# Patient Record
Sex: Female | Born: 1945
Health system: Southern US, Community
[De-identification: ages and names within clinical notes are randomized; demographics above are authoritative.]

## PROBLEM LIST (undated history)

## (undated) DIAGNOSIS — H35039 Hypertensive retinopathy, unspecified eye: Secondary | ICD-10-CM

## (undated) DIAGNOSIS — E785 Hyperlipidemia, unspecified: Secondary | ICD-10-CM

## (undated) DIAGNOSIS — H3530 Unspecified macular degeneration: Secondary | ICD-10-CM

## (undated) DIAGNOSIS — I10 Essential (primary) hypertension: Secondary | ICD-10-CM

## (undated) DIAGNOSIS — E669 Obesity, unspecified: Secondary | ICD-10-CM

## (undated) DIAGNOSIS — E039 Hypothyroidism, unspecified: Secondary | ICD-10-CM

## (undated) DIAGNOSIS — I119 Hypertensive heart disease without heart failure: Secondary | ICD-10-CM

## (undated) DIAGNOSIS — I4891 Unspecified atrial fibrillation: Secondary | ICD-10-CM

## (undated) DIAGNOSIS — D6851 Activated protein C resistance: Secondary | ICD-10-CM

## (undated) DIAGNOSIS — E079 Disorder of thyroid, unspecified: Secondary | ICD-10-CM

## (undated) HISTORY — DX: Hyperlipidemia, unspecified: E78.5

## (undated) HISTORY — DX: Unspecified macular degeneration: H35.30

## (undated) HISTORY — PX: CATARACT EXTRACTION: SUR2

## (undated) HISTORY — PX: EYE SURGERY: SHX253

## (undated) HISTORY — DX: Unspecified atrial fibrillation: I48.91

## (undated) HISTORY — PX: COSMETIC SURGERY: SHX468

## (undated) HISTORY — DX: Essential (primary) hypertension: I10

## (undated) HISTORY — DX: Disorder of thyroid, unspecified: E07.9

## (undated) HISTORY — DX: Hypertensive retinopathy, unspecified eye: H35.039

## (undated) HISTORY — DX: Activated protein C resistance: D68.51

## (undated) HISTORY — PX: DENTAL SURGERY: SHX609

---

## 1999-10-01 ENCOUNTER — Other Ambulatory Visit: Admission: RE | Admit: 1999-10-01 | Discharge: 1999-10-01 | Payer: Self-pay | Admitting: *Deleted

## 2001-02-24 ENCOUNTER — Other Ambulatory Visit: Admission: RE | Admit: 2001-02-24 | Discharge: 2001-02-24 | Payer: Self-pay | Admitting: *Deleted

## 2004-12-10 ENCOUNTER — Encounter: Admission: RE | Admit: 2004-12-10 | Discharge: 2004-12-10 | Payer: Self-pay | Admitting: Gastroenterology

## 2005-11-12 ENCOUNTER — Other Ambulatory Visit: Admission: RE | Admit: 2005-11-12 | Discharge: 2005-11-12 | Payer: Self-pay | Admitting: Internal Medicine

## 2007-04-09 ENCOUNTER — Emergency Department (HOSPITAL_COMMUNITY): Admission: EM | Admit: 2007-04-09 | Discharge: 2007-04-09 | Payer: Self-pay | Admitting: Emergency Medicine

## 2007-04-21 ENCOUNTER — Other Ambulatory Visit: Admission: RE | Admit: 2007-04-21 | Discharge: 2007-04-21 | Payer: Self-pay | Admitting: *Deleted

## 2007-08-06 ENCOUNTER — Ambulatory Visit: Admission: RE | Admit: 2007-08-06 | Discharge: 2007-08-06 | Payer: Self-pay | Admitting: Cardiology

## 2007-08-06 ENCOUNTER — Encounter (INDEPENDENT_AMBULATORY_CARE_PROVIDER_SITE_OTHER): Payer: Self-pay | Admitting: Cardiology

## 2007-08-06 ENCOUNTER — Ambulatory Visit: Payer: Self-pay | Admitting: Surgery

## 2010-09-04 ENCOUNTER — Ambulatory Visit
Admission: RE | Admit: 2010-09-04 | Discharge: 2010-09-04 | Payer: Self-pay | Source: Home / Self Care | Attending: Family Medicine | Admitting: Family Medicine

## 2010-09-25 ENCOUNTER — Encounter: Payer: Self-pay | Admitting: Vascular Surgery

## 2010-10-02 ENCOUNTER — Encounter (INDEPENDENT_AMBULATORY_CARE_PROVIDER_SITE_OTHER): Payer: 59 | Admitting: Vascular Surgery

## 2010-10-02 DIAGNOSIS — I8010 Phlebitis and thrombophlebitis of unspecified femoral vein: Secondary | ICD-10-CM

## 2010-10-02 NOTE — Consult Note (Signed)
NEW PATIENT CONSULTATION  Carly Romero, Carly Romero DOB:  [birth year]                                       10/02/2010 [id number]#:[id number]  The patient presents today for evaluation of recent episode of superficial thrombophlebitis in her left great saphenous vein.  She is a very pleasant 65 year old white female with a long history dating back to her early 20s of clotting.  She reports that her first episode was of right leg DVT in senior year of college in 1970.  She apparently was hospitalized for 40 days on heparin.  She was begun on Coumadin in the 1980s.  She does recall one episode 15 years ago of probable DVT while on Coumadin.  She has had no history of DVT since that time.  Recently she did have an area where she struck her left calf with some trivial trauma and several days later noted thrombophlebitis.  She had an ultrasound which I reviewed from January 17 at [facility] confirming superficial thrombus in her great saphenous vein from the left calf to left thigh.  There was no evidence of DVT either acute or chronic at this ultrasound.  She does have a history of pulmonary embolus dating back into the 1970s as well.  She has had no further pulmonary embolus since being on Coumadin.  She has had no complications from Coumadin therapy.  She does report some chronic swelling but is very diligent in her elevation of her legs and of wearing compression garments when she is going to be on her feet a great deal of time.  She does have a history of factor V Leiden.  I do not have any documentation of her hypercoagulable workup.  PAST HISTORY:  Significant for elevated cholesterol, elevated blood pressure.  SOCIAL HISTORY:  She is single.  She is retired.  She does a lot of volunteer work.  She does smoke a pack of cigarettes per day and has social alcohol consumption.  FAMILY HISTORY:  Significant for father dying at age 77 of atherosclerosis.  Brother with an  aneurysm.  REVIEW OF SYSTEMS:  No weight loss or gain. VASCULAR:  Positive for history of prior thrombophlebitis and DVT. HEMATOLOGIC:  For factor V Leiden clotting disorder. URINARY:  For frequent urination. PSYCHIATRIC:  For depression. Otherwise her review of systems was negative.  PHYSICAL EXAMINATION:  General:  A well-developed, well-nourished white female appearing stated age, in no acute stress.  Vital signs:  Blood pressure is 140/81, pulse 62, respirations 18.  HEENT:  Normal.  Chest: Clear bilaterally without rales, rhonchi or wheezes.  Heart:  Regular rate and rhythm.  She has 2+ radial and 2+ dorsalis pedis pulses. Abdomen:  Soft, nontender.  No masses.  Moderate obesity. Musculoskeletal:  Shows no major deformities.  She does have bilateral cyanosis related to her venous hypertension.  Neurological:  No focal weakness or paresthesias.  Skin:  Without ulcers or rashes.  She does have easily palpable clot in her left great saphenous vein mid calf to thigh.  There is no erythema or tenderness currently.  I had a long discussion with the patient regarding the appropriateness of venacaval filter placement.  She clearly does not have any straightforward indications for this.  She does have a history of DVT in the past but none within the last, greater than 15 years while on adequate Coumadin  therapy.  I explained that the recent episode of superficial thrombophlebitis while on Coumadin therapy would not be an absolute indication for filter placement.  I explained the potential risk for caval occlusion and long-term swelling and would reserve caval filter placement for recurrent DVT or pulmonary embolus on appropriate anticoagulation therapy.  I explained that even if she had filter placement she would have to continue on lifelong anticoagulation due to her hypercoagulable state.  She understands and will notify us should she develop any new difficulty.    Larina Earthly,  M.D.  TFE/MEDQ  D:  10/02/2010  T:  10/02/2010  Job:  [id number]  cc:   Claude Manges, NP

## 2011-03-11 ENCOUNTER — Other Ambulatory Visit: Payer: Self-pay | Admitting: Physician Assistant

## 2011-03-11 ENCOUNTER — Other Ambulatory Visit (HOSPITAL_COMMUNITY)
Admission: RE | Admit: 2011-03-11 | Discharge: 2011-03-11 | Disposition: A | Discharge status: Home / Self Care | Payer: 59 | Source: Ambulatory Visit | Attending: Family Medicine | Admitting: Family Medicine

## 2011-03-11 DIAGNOSIS — Z01419 Encounter for gynecological examination (general) (routine) without abnormal findings: Secondary | ICD-10-CM | POA: Insufficient documentation

## 2011-04-17 ENCOUNTER — Other Ambulatory Visit: Payer: Self-pay | Admitting: Dermatology

## 2011-05-31 LAB — BASIC METABOLIC PANEL WITH GFR
BUN: 14
CO2: 29
Calcium: 9.1
Chloride: 99
Creatinine, Ser: 0.64
GFR calc non Af Amer: >60
Glucose, Bld: 111 — ABNORMAL HIGH
Potassium: 3.5
Sodium: 140

## 2011-05-31 LAB — PROTIME-INR
INR: 1.9 — ABNORMAL HIGH
Prothrombin Time: 22.3 — ABNORMAL HIGH

## 2011-05-31 LAB — CBC
MCV: 91.5
Platelets: 281
RDW: 13.4

## 2011-05-31 LAB — DIFFERENTIAL
Basophils Absolute: 0.1
Eosinophils Absolute: 0.3
Lymphocytes Relative: 36
Lymphs Abs: 4.4 — ABNORMAL HIGH
Neutro Abs: 6.8
Neutrophils Relative %: 56

## 2011-05-31 LAB — POCT CARDIAC MARKERS
CKMB, poc: <1 — ABNORMAL LOW
Myoglobin, poc: 62
Operator id: 282201
Troponin i, poc: <0.05

## 2011-05-31 LAB — D-DIMER, QUANTITATIVE

## 2013-07-28 ENCOUNTER — Other Ambulatory Visit: Payer: Self-pay | Admitting: Gastroenterology

## 2013-07-28 DIAGNOSIS — Z1211 Encounter for screening for malignant neoplasm of colon: Secondary | ICD-10-CM

## 2013-08-10 ENCOUNTER — Ambulatory Visit
Admission: RE | Admit: 2013-08-10 | Discharge: 2013-08-10 | Disposition: A | Discharge status: Home / Self Care | Payer: 59 | Source: Ambulatory Visit | Attending: Gastroenterology | Admitting: Gastroenterology

## 2013-08-10 DIAGNOSIS — Z1211 Encounter for screening for malignant neoplasm of colon: Secondary | ICD-10-CM

## 2014-04-18 ENCOUNTER — Other Ambulatory Visit: Payer: Self-pay | Admitting: Cardiology

## 2014-04-18 ENCOUNTER — Ambulatory Visit
Admission: RE | Admit: 2014-04-18 | Discharge: 2014-04-18 | Disposition: A | Discharge status: Home / Self Care | Payer: Medicare Other | Source: Ambulatory Visit | Attending: Cardiology | Admitting: Cardiology

## 2014-04-18 DIAGNOSIS — I4891 Unspecified atrial fibrillation: Secondary | ICD-10-CM

## 2014-04-26 ENCOUNTER — Ambulatory Visit (INDEPENDENT_AMBULATORY_CARE_PROVIDER_SITE_OTHER): Payer: Medicare Other

## 2014-04-26 VITALS — BP 140/66 | HR 86 | Resp 16 | Ht 66.0 in | Wt 220.0 lb

## 2014-04-26 DIAGNOSIS — M79609 Pain in unspecified limb: Secondary | ICD-10-CM

## 2014-04-26 DIAGNOSIS — M722 Plantar fascial fibromatosis: Secondary | ICD-10-CM

## 2014-04-26 DIAGNOSIS — M79673 Pain in unspecified foot: Secondary | ICD-10-CM

## 2014-04-26 NOTE — Progress Notes (Signed)
   Subjective:    Patient ID: Carly Romero, female    DOB: [birth year], 68 y.o.   MRN: [id number]  HPI Comments: N heel pain L right heel and inferior arch, left slight D 1 year O worsening C dull pain A barefooted, but constant pain T Birkenstock, OTC inserts     Review of Systems  Respiratory: Positive for shortness of breath.   All other systems reviewed and are negative.      Objective:   Physical Exam 68 year old white female well-developed well-nourished oriented x3 presents this time with a complaint of inferior heel pain right more so than left been going on for about a year aggravated with certain activities. Burning or she goes barefoot with her she wears Birkenstocks or good shoes however he does have some softer shoes which may have contributed to the symptomology. Lower extremity objective findings as follows vascular status is intact pedal pulses palpable DP +2/4 bilateral PT one over 4 bilateral capillary refill time 3 seconds. Epicritic and proprioceptive sensations intact and symmetric bilateral there is normal plantar response DTRs are listed dermatologic the skin color pigment normal hair growth absent nails unremarkable there is some thickening yellowing discoloration proptosis of nails bilateral knee dressings in the future they with debridement and palliative care if needed on orthopedic biomechanical exam pain on palpation of the medial calcaneal tubercle Magan the plantar fascia mid arch is most painful symptomatic right more so than left. X-rays reveal mild inferior calcaneal spur some mild retrocalcaneal spurring there on the right foot are some pretibial calcifications noted over no fractures no other osseous abnormality soft tissue edema noted mild fascial thickening noted bilateral.       Assessment & Plan:  Assessment this time is plantar fasciitis/heel spur syndrome fascial strapping applied to both feet maintained for 5 days also recommended Tylenol as  needed for pain also ice to the heel area maintain good stable shoe no barefoot or flimsy shoes or flip-flops use work boot for yard gardening work Writer or crocs around the house if no improvement reappointed in 2 weeks for further followup and reevaluation possible future nail care debridement as needed  Harriet Masson DPM

## 2014-04-26 NOTE — Patient Instructions (Signed)
Plantar Fasciitis  Plantar fasciitis is a common condition that causes foot pain. It is soreness (inflammation) of the band of tough fibrous tissue on the bottom of the foot that runs from the heel bone (calcaneus) to the ball of the foot. The cause of this soreness may be from excessive standing, poor fitting shoes, running on hard surfaces, being overweight, having an abnormal walk, or overuse (this is common in runners) of the painful foot or feet. It is also common in aerobic exercise dancers and ballet dancers.  SYMPTOMS   Most people with plantar fasciitis complain of:   Severe pain in the morning on the bottom of their foot especially when taking the first steps out of bed. This pain recedes after a few minutes of walking.   Severe pain is experienced also during walking following a long period of inactivity.   Pain is worse when walking barefoot or up stairs  DIAGNOSIS    Your caregiver will diagnose this condition by examining and feeling your foot.   Special tests such as X-rays of your foot, are usually not needed.  PREVENTION    Consult a sports medicine professional before beginning a new exercise program.   Walking programs offer a good workout. With walking there is a lower chance of overuse injuries common to runners. There is less impact and less jarring of the joints.   Begin all new exercise programs slowly. If problems or pain develop, decrease the amount of time or distance until you are at a comfortable level.   Wear good shoes and replace them regularly.   Stretch your foot and the heel cords at the back of the ankle (Achilles tendon) both before and after exercise.   Run or exercise on even surfaces that are not hard. For example, asphalt is better than pavement.   Do not run barefoot on hard surfaces.   If using a treadmill, vary the incline.   Do not continue to workout if you have foot or joint problems. Seek professional help if they do not improve.  HOME CARE INSTRUCTIONS     Avoid activities that cause you pain until you recover.   Use ice or cold packs on the problem or painful areas after working out.   Only take over-the-counter or prescription medicines for pain, discomfort, or fever as directed by your caregiver.   Soft shoe inserts or athletic shoes with air or gel sole cushions may be helpful.   If problems continue or become more severe, consult a sports medicine caregiver or your own health care provider. Cortisone is a potent anti-inflammatory medication that may be injected into the painful area. You can discuss this treatment with your caregiver.  MAKE SURE YOU:    Understand these instructions.   Will watch your condition.   Will get help right away if you are not doing well or get worse.  Document Released: 04/30/2001 Document Revised: 10/28/2011 Document Reviewed: 06/29/2008  ExitCare Patient Information 2015 [employer]. This information is not intended to replace advice given to you by your health care provider. Make sure you discuss any questions you have with your health care provider.

## 2014-05-13 ENCOUNTER — Encounter: Payer: Self-pay | Admitting: Cardiology

## 2014-05-13 DIAGNOSIS — E039 Hypothyroidism, unspecified: Secondary | ICD-10-CM | POA: Insufficient documentation

## 2014-05-13 DIAGNOSIS — E785 Hyperlipidemia, unspecified: Secondary | ICD-10-CM | POA: Insufficient documentation

## 2014-05-13 DIAGNOSIS — E669 Obesity, unspecified: Secondary | ICD-10-CM | POA: Insufficient documentation

## 2014-05-13 DIAGNOSIS — I119 Hypertensive heart disease without heart failure: Secondary | ICD-10-CM | POA: Insufficient documentation

## 2014-05-13 DIAGNOSIS — I4819 Other persistent atrial fibrillation: Secondary | ICD-10-CM | POA: Insufficient documentation

## 2014-05-13 DIAGNOSIS — Z7901 Long term (current) use of anticoagulants: Secondary | ICD-10-CM | POA: Insufficient documentation

## 2014-05-16 ENCOUNTER — Encounter (HOSPITAL_COMMUNITY): Payer: Self-pay | Admitting: Pharmacy Technician

## 2014-05-24 NOTE — H&P (Signed)
Carly Romero A  Date of visit:  05/24/2014 DOB:  [birth year]    Age:  68 yrs. Medical record number:  [id number]     Account number:  [id number] Primary Care Provider: Milagros Evener ____________________________ CURRENT DIAGNOSES  1. Persistent atrial fibrillation  2. Obesity  3. Essential (primary) hypertension  4. Long term (current) use of anticoagulants  5. Hypothyroidism, unspecified  6. Hyperlipidemia, unspecified ____________________________ ALLERGIES  Atorvastatin, Intolerance-diarrhea  Chantix, Suicidal thoughts  Codeine, Swelling (non-specific) ____________________________ MEDICATIONS  1. multivitamin tablet, 1 p.o. daily  2. Synthroid 112 mcg tablet, 1 p.o. daily  3. Crestor 20 mg tablet, 1 p.o. daily  4. amlodipine 2.5 mg tablet, 1 p.o. daily  5. atenolol 50 mg tablet, 1 p.o. daily  6. warfarin 5 mg tablet, Take as directed  7. Calcium 600 + D(3) 600 mg calcium-200 unit capsule, 1 p.o. daily  8. biotin 10 mg tablet, 1 p.o. daily  9. diltiazem ER 240 mg tablet,extended release 24 hr, 1 p.o. daily ____________________________ CHIEF COMPLAINTS Pre cardioversion visit ____________________________ HISTORY OF PRESENT ILLNESS  Patient seen for evaluation prior to cardioversion. She has a prior history of recurrent thromboembolic disease with factor V Leiden deficiency. She has then on long-term anticoagulants for years. She is obese and has hypertension and hyperlipidemia. I saw her at the end of August with a several month history of lower extremity edema, malaise, fatigue, and palpitations and she was found to be in atrial fibrillation. I started her on treatment with diltiazem and an echocardiogram showed LVH with preserved systolic function and mild left atrial enlargement. The duration of atrial fibrillation was unclear. We discussed options and talked about initiation of medicines versus going directly to cardioversion. After discussion of the options she felt that she  would rather go directly to cardioversion rather than start antiarrhythmic therapy although the chance of maintaining sinus rhythm may be less with that approach. Her anticoagulation has been therapeutic at her primary care doctor's office over the past 6 weeks. She is quite symptomatic with some episodic dizziness as well as shortness of breath and lower extremity edema. ____________________________ PAST HISTORY  Past Medical Illnesses:  hypertension, hyperlipidemia, hypothyroidism, obesity, thromboembolic disorder, DVT, Factor 5 Leiden, pulmonary embolus;  Cardiovascular Illnesses:  atrial fibrillation;  Surgical Procedures:  surg eyelids, wisdom teeth;  Cardiology Procedures-Invasive:  no history of prior cardiac procedures;  Cardiology Procedures-Noninvasive:  holter monitor, treadmill cardiolite, echocardiogram September 2015;  LVEF of 60% documented via echocardiogram on 05/09/2014,   ____________________________ CARDIO-PULMONARY TEST DATES EKG Date:  04/18/2014;  Echocardiography Date: 05/09/2014;  Chest Xray Date: 04/18/2014;   ____________________________ FAMILY HISTORY Brother -- Brother alive with problem, Hypertension Brother -- Brother dead, Alcoholism Brother -- Brother dead, Prostate cancer Brother -- Brother dead, Abdominal aortic aneurysm Father -- Father dead, Dementia/Alzheimers Mother -- Mother dead, Ulcerative colitis ____________________________ SOCIAL HISTORY Alcohol Use:  8 glasses wine q week;  Smoking:  used to smoke but quit, greater than 50 pack year history;  Diet:  regular diet;  Lifestyle:  single;  Exercise:  no regular exercise;  Occupation:  retired and Programmer, applications;  Residence:  lives alone;   ____________________________ REVIEW OF SYSTEMS General:  obesity, malaise and fatigue Eyes: wears eye glasses/contact lenses, denies diplopia, glaucoma or visual field defects. Respiratory: dyspnea with exertion Cardiovascular:  please review HPI Abdominal: denies  dyspepsia, GI bleeding, constipation, or diarrheaGenitourinary-Female: frequency, stress incontinence, urgency Neurological:  dizziness  ____________________________ PHYSICAL EXAMINATION VITAL SIGNS  Blood Pressure:  130/80 Sitting, Right  arm, regular cuff   Pulse:  86/min. Weight:  223.00 lbs. Height:  66"BMI: 36  Constitutional:  pleasant white female, in no acute distress, moderately obese Skin:  warm and dry to touch, no apparent skin lesions, or masses noted. Head:  normocephalic, normal hair pattern, no masses or tenderness Chest:  normal symmetry, clear to auscultation. Cardiac:  irregularly irregular rhythm, normal S1 and S2, no S3 or S4, no murmur Abdomen:  abdomen soft,non-tender, no masses, no hepatospenomegaly, or aneurysm noted Peripheral Pulses:  the femoral,dorsalis pedis, and posterior tibial pulses are full and equal bilaterally with no bruits auscultated. Extremities & Back:  1+ edema, bilateral venous insufficiency changes present Neurological:  no gross motor or sensory deficits noted, affect appropriate, oriented x3. ____________________________ MOST RECENT LIPID PANEL 04/06/14  CHOL TOTL 187 mg/dl, LDL 87 NM, HDL 64 mg/dl, TRIGLYCER 177 mg/dl, ALT 16 u/l, ALK PHOS 63 u/l, CHOL/HDL 2.9 (Calc) and AST 20 u/l ____________________________ IMPRESSIONS/PLAN  1. Persistent atrial fibrillation 2. Factor V Leiden with history of recurrent thrombosis 3. Hypertensive heart disease 4. Hyperlipidemia 5. Obesity  Recommendations:  Cardioversion discussed with the patient including risks of stroke, arrhythmia, death, or anesthesia risks. The patient understands and is willing to proceed. Also discussed possibility of reversion to atrial fibrillation and plans afterward. ____________________________ TODAYS ORDERS  1. Comprehensive Metabolic Panel: Today  2. CBC w/out Diff: Today  3. Draw PT/INR: Today  4. Cardioversion Thursday                        ____________________________ Cardiology Physician:  Kerry Hough MD [facility]

## 2014-05-26 ENCOUNTER — Encounter (HOSPITAL_COMMUNITY): Payer: Self-pay

## 2014-05-26 ENCOUNTER — Ambulatory Visit (HOSPITAL_COMMUNITY)
Admission: RE | Admit: 2014-05-26 | Discharge: 2014-05-26 | Disposition: A | Discharge status: Home / Self Care | Payer: Medicare Other | Source: Ambulatory Visit | Attending: Cardiology | Admitting: Cardiology

## 2014-05-26 ENCOUNTER — Encounter (HOSPITAL_COMMUNITY)
Admission: RE | Disposition: A | Discharge status: Home / Self Care | Payer: Self-pay | Source: Ambulatory Visit | Attending: Cardiology

## 2014-05-26 ENCOUNTER — Encounter (HOSPITAL_COMMUNITY): Payer: Medicare Other | Admitting: Certified Registered"

## 2014-05-26 ENCOUNTER — Ambulatory Visit (HOSPITAL_COMMUNITY): Payer: Medicare Other | Admitting: Certified Registered"

## 2014-05-26 DIAGNOSIS — E039 Hypothyroidism, unspecified: Secondary | ICD-10-CM | POA: Insufficient documentation

## 2014-05-26 DIAGNOSIS — I10 Essential (primary) hypertension: Secondary | ICD-10-CM | POA: Insufficient documentation

## 2014-05-26 DIAGNOSIS — I4891 Unspecified atrial fibrillation: Secondary | ICD-10-CM | POA: Diagnosis present

## 2014-05-26 DIAGNOSIS — D6851 Activated protein C resistance: Secondary | ICD-10-CM | POA: Insufficient documentation

## 2014-05-26 DIAGNOSIS — Z87891 Personal history of nicotine dependence: Secondary | ICD-10-CM | POA: Diagnosis not present

## 2014-05-26 HISTORY — PX: CARDIOVERSION: SHX1299

## 2014-05-26 SURGERY — CARDIOVERSION
Anesthesia: Monitor Anesthesia Care

## 2014-05-26 MED ORDER — SODIUM CHLORIDE 0.9 % IV SOLN
INTRAVENOUS | Status: DC
  Administered 2014-05-26: 500 mL via INTRAVENOUS

## 2014-05-26 MED ORDER — PROPOFOL 10 MG/ML IV BOLUS
INTRAVENOUS | Status: AC
  Filled 2014-05-26: qty 20

## 2014-05-26 MED ORDER — LIDOCAINE HCL (CARDIAC) 20 MG/ML IV SOLN
INTRAVENOUS | Status: DC | PRN
  Administered 2014-05-26: 20 mg via INTRAVENOUS

## 2014-05-26 MED ORDER — ATENOLOL 50 MG PO TABS: 25.0000 mg | ORAL_TABLET | Freq: Every day | ORAL | Status: DC

## 2014-05-26 MED ORDER — PROPOFOL 10 MG/ML IV BOLUS
INTRAVENOUS | Status: DC | PRN
Start: 2014-05-26 — End: 2014-05-26
  Administered 2014-05-26: 70 mg via INTRAVENOUS

## 2014-05-26 MED ORDER — HYDROCORTISONE 1 % EX CREA
1.0000 "application " | TOPICAL_CREAM | Freq: Three times a day (TID) | CUTANEOUS | Status: DC | PRN
  Filled 2014-05-26: qty 28

## 2014-05-26 MED ORDER — LIDOCAINE HCL (CARDIAC) 20 MG/ML IV SOLN
INTRAVENOUS | Status: AC
  Filled 2014-05-26: qty 5

## 2014-05-26 NOTE — Anesthesia Postprocedure Evaluation (Signed)
  Anesthesia Post-op Note  Patient: Carly Romero  Procedure(s) Performed: Procedure(s): CARDIOVERSION (N/A)  Patient Location: Endoscopy Unit  Anesthesia Type:General  Level of Consciousness: awake, alert , oriented and patient cooperative  Airway and Oxygen Therapy: Patient Spontanous Breathing  Post-op Pain: none  Post-op Assessment: Post-op Vital signs reviewed, Patient's Cardiovascular Status Stable, Respiratory Function Stable, Patent Airway, No signs of Nausea or vomiting and Pain level controlled  Post-op Vital Signs: Reviewed and stable  Last Vitals:  Filed Vitals:   05/26/14 1359  BP: 158/55  Pulse: 50  Resp: 19    Complications: No apparent anesthesia complications

## 2014-05-26 NOTE — Discharge Instructions (Signed)

## 2014-05-26 NOTE — Transfer of Care (Signed)
Immediate Anesthesia Transfer of Care Note  Patient: Carly Romero  Procedure(s) Performed: Procedure(s): CARDIOVERSION (N/A)  Patient Location: Endoscopy Unit  Anesthesia Type:MAC  Level of Consciousness: awake, alert , oriented and patient cooperative  Airway & Oxygen Therapy: Patient Spontanous Breathing and Patient connected to nasal cannula oxygen  Post-op Assessment: Report given to PACU RN, Post -op Vital signs reviewed and stable and Patient moving all extremities  Post vital signs: Reviewed and stable  Complications: No apparent anesthesia complications

## 2014-05-26 NOTE — CV Procedure (Signed)
Electrical Cardioversion Procedure Note  Carly Romero   68 y.o. female MRN: [id number] DOB: 09-27-1945  Today's date: 05/26/2014  Procedure: Electrical Cardioversion  Indications:  Atrial Fibrillation  Time Out: Verified patient identification, verified procedure,medications/allergies/relevent history reviewed, required imaging and test results available.  Performed  Procedure Details  The patient was NPO after midnight. Anesthesia was administered at the beside  by [facility] with 20 mg of lidocaine and 70 mg of propofol.  Cardioversion was done with synchronized biphasic defibrillation with AP pads with 120 watts.  The patient converted to normal sinus rhythm. The patient tolerated the procedure well   IMPRESSION:  Successful cardioversion of atrial fibrillation    W. Tollie Eth, Brooke Bonito. MD [facility]   05/26/2014, 1:19 PM

## 2014-05-26 NOTE — Anesthesia Preprocedure Evaluation (Addendum)
Anesthesia Evaluation  Patient identified by MRN, date of birth, ID band Patient awake    Reviewed: Allergy & Precautions, H&P , NPO status , Patient's Chart, lab work & pertinent test results  History of Anesthesia Complications (+) history of anesthetic complications  Airway Mallampati: I TM Distance: >3 FB Neck ROM: Full    Dental  (+) Dental Advisory Given   Pulmonary former smoker,  breath sounds clear to auscultation        Cardiovascular hypertension, Pt. on medications - angina+ dysrhythmias Atrial Fibrillation Rhythm:Irregular Rate:Normal  Normal LVF as per Dr. Wynonia Lawman   Neuro/Psych negative neurological ROS     GI/Hepatic negative GI ROS, Neg liver ROS,   Endo/Other  Hypothyroidism Morbid obesity  Renal/GU negative Renal ROS     Musculoskeletal   Abdominal (+) + obese,   Peds  Hematology  (+) Blood dyscrasia, , Factor V Leiden   Anesthesia Other Findings   Reproductive/Obstetrics                        Anesthesia Physical Anesthesia Plan  ASA: III  Anesthesia Plan: General   Post-op Pain Management:    Induction: Intravenous  Airway Management Planned: Mask  Additional Equipment:   Intra-op Plan:   Post-operative Plan:   Informed Consent: I have reviewed the patients History and Physical, chart, labs and discussed the procedure including the risks, benefits and alternatives for the proposed anesthesia with the patient or authorized representative who has indicated his/her understanding and acceptance.   Dental advisory given  Plan Discussed with: Anesthesiologist, Surgeon and CRNA  Anesthesia Plan Comments: (Plan routine monitors, GA for cardioversion)       Anesthesia Quick Evaluation

## 2014-05-27 ENCOUNTER — Encounter (HOSPITAL_COMMUNITY): Payer: Self-pay | Admitting: Cardiology

## 2014-08-31 DIAGNOSIS — I119 Hypertensive heart disease without heart failure: Secondary | ICD-10-CM | POA: Insufficient documentation

## 2014-08-31 DIAGNOSIS — E7849 Other hyperlipidemia: Secondary | ICD-10-CM | POA: Insufficient documentation

## 2014-09-01 DIAGNOSIS — D6851 Activated protein C resistance: Secondary | ICD-10-CM | POA: Insufficient documentation

## 2014-09-12 ENCOUNTER — Inpatient Hospital Stay (HOSPITAL_COMMUNITY)
Admission: RE | Admit: 2014-09-12 | Discharge: 2014-09-15 | DRG: 309 | Disposition: A | Discharge status: Home / Self Care | Payer: Medicare Other | Source: Ambulatory Visit | Attending: Cardiology | Admitting: Cardiology

## 2014-09-12 ENCOUNTER — Encounter (HOSPITAL_COMMUNITY): Payer: Self-pay

## 2014-09-12 DIAGNOSIS — Z87891 Personal history of nicotine dependence: Secondary | ICD-10-CM | POA: Diagnosis not present

## 2014-09-12 DIAGNOSIS — Z79899 Other long term (current) drug therapy: Secondary | ICD-10-CM

## 2014-09-12 DIAGNOSIS — Z888 Allergy status to other drugs, medicaments and biological substances status: Secondary | ICD-10-CM | POA: Diagnosis not present

## 2014-09-12 DIAGNOSIS — I481 Persistent atrial fibrillation: Principal | ICD-10-CM | POA: Diagnosis present

## 2014-09-12 DIAGNOSIS — E039 Hypothyroidism, unspecified: Secondary | ICD-10-CM | POA: Diagnosis present

## 2014-09-12 DIAGNOSIS — E785 Hyperlipidemia, unspecified: Secondary | ICD-10-CM | POA: Diagnosis present

## 2014-09-12 DIAGNOSIS — D6851 Activated protein C resistance: Secondary | ICD-10-CM | POA: Diagnosis present

## 2014-09-12 DIAGNOSIS — Z6836 Body mass index (BMI) 36.0-36.9, adult: Secondary | ICD-10-CM | POA: Diagnosis not present

## 2014-09-12 DIAGNOSIS — Z7901 Long term (current) use of anticoagulants: Secondary | ICD-10-CM | POA: Diagnosis not present

## 2014-09-12 DIAGNOSIS — Z885 Allergy status to narcotic agent status: Secondary | ICD-10-CM

## 2014-09-12 DIAGNOSIS — E669 Obesity, unspecified: Secondary | ICD-10-CM | POA: Diagnosis present

## 2014-09-12 DIAGNOSIS — I4819 Other persistent atrial fibrillation: Secondary | ICD-10-CM | POA: Diagnosis present

## 2014-09-12 DIAGNOSIS — Z86711 Personal history of pulmonary embolism: Secondary | ICD-10-CM | POA: Diagnosis not present

## 2014-09-12 DIAGNOSIS — I119 Hypertensive heart disease without heart failure: Secondary | ICD-10-CM | POA: Diagnosis present

## 2014-09-12 HISTORY — DX: Hypertensive heart disease without heart failure: I11.9

## 2014-09-12 HISTORY — DX: Hypothyroidism, unspecified: E03.9

## 2014-09-12 HISTORY — DX: Obesity, unspecified: E66.9

## 2014-09-12 LAB — BASIC METABOLIC PANEL
Anion gap: 3 — ABNORMAL LOW (ref 5–15)
BUN: 14 mg/dL (ref 6–23)
CHLORIDE: 106 mmol/L (ref 96–112)
CO2: 33 mmol/L — AB (ref 19–32)
Calcium: 9 mg/dL (ref 8.4–10.5)
Creatinine, Ser: 0.78 mg/dL (ref 0.50–1.10)
GFR calc Af Amer: >90 mL/min (ref >90)
GFR, EST NON AFRICAN AMERICAN: 85 mL/min — AB (ref >90)
GLUCOSE: 110 mg/dL — AB (ref 70–99)
POTASSIUM: 4.6 mmol/L (ref 3.5–5.1)
Sodium: 142 mmol/L (ref 135–145)

## 2014-09-12 LAB — PROTIME-INR
INR: 2.58 — ABNORMAL HIGH (ref 0.00–1.49)
Prothrombin Time: 27.9 seconds — ABNORMAL HIGH (ref 11.6–15.2)

## 2014-09-12 LAB — MAGNESIUM: MAGNESIUM: 2 mg/dL (ref 1.5–2.5)

## 2014-09-12 MED ORDER — ATENOLOL 25 MG PO TABS
50.0000 mg | ORAL_TABLET | Freq: Two times a day (BID) | ORAL | Status: DC
  Administered 2014-09-12 – 2014-09-14 (×4): 50 mg via ORAL
  Filled 2014-09-12 (×5): qty 2

## 2014-09-12 MED ORDER — LEVOTHYROXINE SODIUM 112 MCG PO TABS
112.0000 ug | ORAL_TABLET | Freq: Every day | ORAL | Status: DC
Start: 2014-09-13 — End: 2014-09-15
  Administered 2014-09-13 – 2014-09-15 (×3): 112 ug via ORAL
  Filled 2014-09-12 (×3): qty 1

## 2014-09-12 MED ORDER — ROSUVASTATIN CALCIUM 10 MG PO TABS
20.0000 mg | ORAL_TABLET | Freq: Every day | ORAL | Status: DC
  Administered 2014-09-13 – 2014-09-15 (×3): 20 mg via ORAL
  Filled 2014-09-12 (×3): qty 2

## 2014-09-12 MED ORDER — SODIUM CHLORIDE 0.9 % IJ SOLN
3.0000 mL | Freq: Two times a day (BID) | INTRAMUSCULAR | Status: DC
  Administered 2014-09-12 – 2014-09-13 (×4): 3 mL via INTRAVENOUS

## 2014-09-12 MED ORDER — SODIUM CHLORIDE 0.9 % IV SOLN
250.0000 mL | INTRAVENOUS | Status: DC | PRN
  Administered 2014-09-14: 14:00:00 via INTRAVENOUS

## 2014-09-12 MED ORDER — SODIUM CHLORIDE 0.9 % IJ SOLN: 3.0000 mL | INTRAMUSCULAR | Status: DC | PRN

## 2014-09-12 MED ORDER — WARFARIN SODIUM 5 MG PO TABS
5.0000 mg | ORAL_TABLET | Freq: Every day | ORAL | Status: DC
  Administered 2014-09-13 – 2014-09-14 (×2): 5 mg via ORAL
  Filled 2014-09-12 (×2): qty 1

## 2014-09-12 MED ORDER — WARFARIN - PHYSICIAN DOSING INPATIENT: Freq: Every day | Status: DC

## 2014-09-12 MED ORDER — DOFETILIDE 500 MCG PO CAPS
500.0000 ug | ORAL_CAPSULE | Freq: Two times a day (BID) | ORAL | Status: DC
  Administered 2014-09-12: 500 ug via ORAL
  Filled 2014-09-12: qty 1

## 2014-09-12 NOTE — Care Management Note (Signed)
    Page 1 of 1   09/15/2014     10:49:23 AM CARE MANAGEMENT NOTE 09/15/2014  Patient:  Carly Romero, Carly Romero   Account Number:  [id number]  Date Initiated:  09/12/2014  Documentation initiated by:  GRAVES-BIGELOW,Ranyah Groeneveld  Subjective/Objective Assessment:   Pt admitted for Tikosyn Initiation. Pt uses [facility]     Action/Plan:   Pt will need Rx for 7 day supply on day of d/c. CM will assist with 7 day supply to be filled at the [facility] on day of d/c. Benefits check in process for co pay cost and will make pt aware once completed.   Anticipated DC Date:  09/15/2014   Anticipated DC Plan:  [place]  CM consult      Choice offered to / List presented to:             Status of service:  Completed, signed off Medicare Important Message given?  YES (If response is "NO", the following Medicare IM given date fields will be blank) Date Medicare IM given:  09/15/2014 Medicare IM given by:  GRAVES-BIGELOW,Cillian Gwinner Date Additional Medicare IM given:   Additional Medicare IM given by:    Discharge Disposition:  HOME/SELF CARE  Per UR Regulation:  Reviewed for med. necessity/level of care/duration of stay  If discussed at Long Length of Stay Meetings, dates discussed:    Comments:  09-12-14 1448 Jacqlyn Krauss, RN,BSN  [phone] PER REP AT [place]: COVERED/ TIER 3  50% OF THE TOTAL COST OF MEDICATION ON ALL DOSES AT MAIL ORDER AND RETAIL NO AUTH REQUIRED PATIENT CAN USE: MOST MAJOR RETAIL PHARMACIES CM did call Kristopher Oppenheim at [facility] to check to see if on [employer] and they are.  Test claim resulted cost at 234.35. CM will make pt aware. Will continue to monitor.

## 2014-09-12 NOTE — H&P (Signed)
Carly Romero A  Date of visit:  09/12/2014 DOB:  06-20-1946    Age:  69 yrs. Medical record number:  [id number]     Account number:  [id number] Primary Care Provider: Milagros Evener ____________________________ CURRENT DIAGNOSES  1. Persistent Atrial Fibrillation  2. Obesity  3. Essential (primary) hypertension  4. Hypothyroidism, unspecified  5. Hyperlipidemia, unspecified  6. Long term (current) use of anticoagulants ____________________________ ALLERGIES  Atorvastatin, Intolerance-diarrhea  Chantix, Suicidal thoughts  Codeine, Swelling (non-specific) ____________________________ MEDICATIONS  1. multivitamin tablet, 1 p.o. daily  2. Synthroid 112 mcg tablet, 1 p.o. daily  3. Crestor 20 mg tablet, 1 p.o. daily  4. warfarin 5 mg tablet, Take as directed  5. Calcium 600 + D(3) 600 mg calcium-200 unit capsule, 1 p.o. daily  6. biotin 10 mg tablet, 1 p.o. daily  7. diltiazem ER 240 mg tablet,extended release 24 hr, 1 p.o. daily  8. atenolol 50 mg tablet, one in the am and 1/2 tablet in the pm ____________________________ CHIEF COMPLAINTS  Followup of Persistent Atrial Fibrillation  Hair loss since on diltiazem ____________________________ HISTORY OF PRESENT ILLNESS Patient seen for followup of persistent atrial fibrillation. Since she was previously here she went to see an electrophysiologist at [facility] in atrial fibrillation clinic there. He basically agreed with our recommendations and it recommended that she try medicines initially prior to going to play should because he was concerned about the thromboembolic risk associated with factor V Leiden. She continues to complain of fatigue. She also needs to have a sleep apnea study and we again talked about the relationship of weight and weight reduction to atrial fibrillation. She has no angina and denies PND, orthopnea or claudication. Her major complaint is hair loss on the diltiazem. ____________________________ PAST  HISTORY  Past Medical Illnesses:  hypertension, hyperlipidemia, hypothyroidism, obesity, thromboembolic disorder, DVT, Factor 5 Leiden, pulmonary embolus;  Cardiovascular Illnesses:  atrial fibrillation;  Surgical Procedures:  surg eyelids, wisdom teeth;  NYHA Classification:  I;  Canadian Angina Classification:  Class 0: Asymptomatic;  Cardiology Procedures-Invasive:  cardioversion October 2015;  Cardiology Procedures-Noninvasive:  holter monitor, treadmill cardiolite, echocardiogram September 2015;  LVEF of 60% documented via echocardiogram on 05/09/2014,   ____________________________ CARDIO-PULMONARY TEST DATES EKG Date:  06/02/2014;  Echocardiography Date: 05/09/2014;  Chest Xray Date: 04/18/2014;   ____________________________ SOCIAL HISTORY Alcohol Use:  8 glasses wine q week;  Smoking:  used to smoke but quit, greater than 50 pack year history;  Diet:  regular diet;  Lifestyle:  single;  Exercise:  no regular exercise;  Occupation:  retired and Programmer, [employer];  Residence:  lives alone;   ____________________________ REVIEW OF SYSTEMS General:  obesity, malaise and fatigue Eyes: wears eye glasses/contact lenses, denies diplopia, glaucoma or visual field defects. Respiratory: dyspnea with exertion Cardiovascular:  please review HPI Abdominal: denies dyspepsia, GI bleeding, constipation, or diarrheaGenitourinary-Female: frequency, stress incontinence, urgencyNeurological:  dizziness  ____________________________ PHYSICAL EXAMINATION VITAL SIGNS  Blood Pressure:  142/80 Sitting, Left arm, large cuff  , 146/86 Standing, Left arm and large cuff   Pulse:  80/min. Weight:  227.00 lbs. Height:  66"BMI: 36  Constitutional:  pleasant white female, in no acute distress, moderately obese Skin:  warm and dry to touch, no apparent skin lesions, or masses noted. Head:  normocephalic, normal hair pattern, no masses or tenderness Chest:  normal symmetry, clear to auscultation. Cardiac:  irregularly  irregular rhythm, normal S1 and S2, no S3 or S4, no murmur Abdomen:  abdomen soft,non-tender, no masses, no hepatospenomegaly,  or aneurysm noted Peripheral Pulses:  the femoral,dorsalis pedis, and posterior tibial pulses are full and equal bilaterally with no bruits auscultated. Extremities & Back:  1+ edema, bilateral venous insufficiency changes present Neurological:  no gross motor or sensory deficits noted, affect appropriate, oriented x3. ____________________________ MOST RECENT LIPID PANEL 04/06/14  CHOL TOTL 187 mg/dl, LDL 87 NM, HDL 64 mg/dl, TRIGLYCER 177 mg/dl, CHOL/HDL 2.9 (Calc)  ____________________________ IMPRESSIONS/PLAN  1. Persistent atrial fibrillation 2. Obesity with need to lose weight 3. Hypertension 4. Hair loss possibly due to diltiazem  Recommendations:  Arrange admission to the hospital to initiate Tikosyn. Stop diltiazem and increase atenolol to 50 mg in the morning and 25 in the evening. Further discussion about atrial fibrillation. ____________________________ TODAYS ORDERS  1. Sleep Study with c-pap: First Available  2. Admit to Hospital: January 25                       ____________________________ Cardiology Physician:  Kerry Hough MD [facility]

## 2014-09-13 LAB — BASIC METABOLIC PANEL
Anion gap: 8 (ref 5–15)
BUN: 14 mg/dL (ref 6–23)
CHLORIDE: 106 mmol/L (ref 96–112)
CO2: 30 mmol/L (ref 19–32)
Calcium: 8.8 mg/dL (ref 8.4–10.5)
Creatinine, Ser: 0.85 mg/dL (ref 0.50–1.10)
GFR calc non Af Amer: 69 mL/min — ABNORMAL LOW (ref >90)
GFR, EST AFRICAN AMERICAN: 80 mL/min — AB (ref >90)
GLUCOSE: 98 mg/dL (ref 70–99)
POTASSIUM: 4.1 mmol/L (ref 3.5–5.1)
SODIUM: 144 mmol/L (ref 135–145)

## 2014-09-13 LAB — MAGNESIUM: MAGNESIUM: 2 mg/dL (ref 1.5–2.5)

## 2014-09-13 LAB — PROTIME-INR
INR: 2.58 — ABNORMAL HIGH (ref 0.00–1.49)
PROTHROMBIN TIME: 27.9 s — AB (ref 11.6–15.2)

## 2014-09-13 MED ORDER — SODIUM CHLORIDE 0.9 % IV SOLN: INTRAVENOUS | Status: DC

## 2014-09-13 MED ORDER — DOFETILIDE 250 MCG PO CAPS
250.0000 ug | ORAL_CAPSULE | Freq: Two times a day (BID) | ORAL | Status: DC
  Administered 2014-09-13 – 2014-09-15 (×5): 250 ug via ORAL
  Filled 2014-09-13 (×5): qty 1

## 2014-09-13 NOTE — Progress Notes (Signed)
Subjective:  No complaints  Objective:  Vital Signs in the last 24 hours: BP 147/100 mmHg  Pulse 113  Temp(Src) 97.4 F (36.3 C) (Oral)  Resp 18  Ht 5\' 6"  (1.676 m)  Wt 104.3 kg (229 lb 15 oz)  BMI 37.13 kg/m2  SpO2 98%  Physical Exam: Pleasant obese WF in NAD in bed Lungs:  Clear  Cardiac: irregular rhythm, normal S1 and S2, no S3  Intake/Output from previous day: 01/25 0701 - 01/26 0700 In: -  Out: 600 [Urine:600] Weight Filed Weights   09/12/14 0930 09/13/14 0600  Weight: 104.101 kg (229 lb 8 oz) 104.3 kg (229 lb 15 oz)    Lab Results: Basic Metabolic Panel:  Recent Labs  09/12/14 1124 09/13/14 0410  NA 142 144  K 4.6 4.1  CL 106 106  CO2 33* 30  GLUCOSE 110* 98  BUN 14 14  CREATININE 0.78 0.85    PROTIME: Lab Results  Component Value Date   INR 2.58* 09/13/2014   INR 2.58* 09/12/2014   INR 1.9* 04/09/2007    Telemetry:  Atrial fibrillation with somewhat rapid ventricular response  QTC around .455   Assessment/Plan:  1. Persistent atrial fibrillation on Tikosyn load Adjusted dose for slight prolonged QTC last night.  Rec:  Continue Tikosyn.  Cardioversion tomorrow if not converted.       Kerry Hough  MD [facility]  09/13/2014, 8:38 AM

## 2014-09-13 NOTE — Significant Event (Signed)
Asked to review ECG for patient with Tikosyn load.   Computer is reading QT at 398 with QTc of 528. On manual review, QT appears more like 370 which results in a QTc of 492.  QTc is borderline and needs to be watched closely, but it is OK for her to receive AM dose.

## 2014-09-14 ENCOUNTER — Inpatient Hospital Stay (HOSPITAL_COMMUNITY): Payer: Medicare Other | Admitting: Certified Registered Nurse Anesthetist

## 2014-09-14 ENCOUNTER — Encounter (HOSPITAL_COMMUNITY): Payer: Self-pay | Admitting: *Deleted

## 2014-09-14 ENCOUNTER — Encounter (HOSPITAL_COMMUNITY)
Admission: RE | Disposition: A | Discharge status: Home / Self Care | Payer: Self-pay | Source: Ambulatory Visit | Attending: Cardiology

## 2014-09-14 DIAGNOSIS — D6851 Activated protein C resistance: Secondary | ICD-10-CM | POA: Insufficient documentation

## 2014-09-14 HISTORY — PX: CARDIOVERSION: SHX1299

## 2014-09-14 LAB — MAGNESIUM: MAGNESIUM: 2 mg/dL (ref 1.5–2.5)

## 2014-09-14 LAB — BASIC METABOLIC PANEL
Anion gap: 7 (ref 5–15)
BUN: 13 mg/dL (ref 6–23)
CALCIUM: 8.7 mg/dL (ref 8.4–10.5)
CHLORIDE: 106 mmol/L (ref 96–112)
CO2: 30 mmol/L (ref 19–32)
CREATININE: 0.76 mg/dL (ref 0.50–1.10)
GFR calc Af Amer: >90 mL/min (ref >90)
GFR calc non Af Amer: 85 mL/min — ABNORMAL LOW (ref >90)
Glucose, Bld: 101 mg/dL — ABNORMAL HIGH (ref 70–99)
Potassium: 4.1 mmol/L (ref 3.5–5.1)
Sodium: 143 mmol/L (ref 135–145)

## 2014-09-14 LAB — PROTIME-INR
INR: 2.11 — ABNORMAL HIGH (ref 0.00–1.49)
Prothrombin Time: 23.9 seconds — ABNORMAL HIGH (ref 11.6–15.2)

## 2014-09-14 SURGERY — CARDIOVERSION
Anesthesia: General

## 2014-09-14 MED ORDER — WARFARIN - PHARMACIST DOSING INPATIENT: Freq: Every day | Status: DC

## 2014-09-14 MED ORDER — HYDROCORTISONE 1 % EX CREA
1.0000 "application " | TOPICAL_CREAM | Freq: Three times a day (TID) | CUTANEOUS | Status: DC | PRN
  Filled 2014-09-14: qty 28

## 2014-09-14 MED ORDER — HYDROCORTISONE 1 % EX CREA: 1.0000 "application " | TOPICAL_CREAM | Freq: Three times a day (TID) | CUTANEOUS | Status: DC | PRN

## 2014-09-14 MED ORDER — PROPOFOL 10 MG/ML IV BOLUS
INTRAVENOUS | Status: DC | PRN
  Administered 2014-09-14: 70 mg via INTRAVENOUS

## 2014-09-14 MED ORDER — LIDOCAINE HCL (CARDIAC) 20 MG/ML IV SOLN
INTRAVENOUS | Status: DC | PRN
  Administered 2014-09-14: 40 mg via INTRAVENOUS

## 2014-09-14 MED ORDER — SODIUM CHLORIDE 0.9 % IJ SOLN
3.0000 mL | Freq: Two times a day (BID) | INTRAMUSCULAR | Status: DC
  Administered 2014-09-14: 3 mL via INTRAVENOUS

## 2014-09-14 MED ORDER — WARFARIN SODIUM 7.5 MG PO TABS: 7.5000 mg | ORAL_TABLET | ORAL | Status: DC

## 2014-09-14 MED ORDER — SODIUM CHLORIDE 0.9 % IJ SOLN: 3.0000 mL | INTRAMUSCULAR | Status: DC | PRN

## 2014-09-14 MED ORDER — WARFARIN SODIUM 5 MG PO TABS
5.0000 mg | ORAL_TABLET | ORAL | Status: DC
  Administered 2014-09-15: 5 mg via ORAL
  Filled 2014-09-14: qty 1

## 2014-09-14 MED ORDER — SODIUM CHLORIDE 0.9 % IV SOLN: 250.0000 mL | INTRAVENOUS | Status: DC

## 2014-09-14 MED ORDER — WARFARIN SODIUM 2.5 MG PO TABS
2.5000 mg | ORAL_TABLET | Freq: Once | ORAL | Status: AC
  Administered 2014-09-14: 2.5 mg via ORAL
  Filled 2014-09-14: qty 1

## 2014-09-14 NOTE — Progress Notes (Signed)
ANTICOAGULATION CONSULT NOTE - Initial Consult  Pharmacy Consult for coumadin Indication: atrial fibrillation  Allergies  Allergen Reactions  . Chantix [Varenicline]     Suicidal thoughts  . Codeine Swelling    Swelling as a young adult  . Lipitor [Atorvastatin] Diarrhea    And cramping.    Patient Measurements: Height: 5\' 6"  (167.6 cm) Weight: 229 lb 11.5 oz (104.2 kg) IBW/kg (Calculated) : 59.3   Vital Signs: Temp: 97.6 F (36.4 C) (01/27 0559) Temp Source: Oral (01/27 0559) BP: 131/107 mmHg (01/27 0559) Pulse Rate: 88 (01/27 0559)  Labs:  Recent Labs  09/12/14 1124 09/13/14 0410 09/14/14 0431  LABPROT 27.9* 27.9* 23.9*  INR 2.58* 2.58* 2.11*  CREATININE 0.78 0.85 0.76    Estimated Creatinine Clearance: 83.3 mL/min (by C-G formula based on Cr of 0.76).   Medical History: Past Medical History  Diagnosis Date  . Hypertension   . Hyperlipidemia   . Factor V Leiden   . Thyroid disease   . Atrial fibrillation     Medications:  Prescriptions prior to admission  Medication Sig Dispense Refill Last Dose  . atenolol (TENORMIN) 50 MG tablet Take 0.5 tablets (25 mg total) by mouth daily. (Patient taking differently: Take 25 mg by mouth daily. Take 50mg  in the morning and half a tablet at night)   09/12/2014 at 0700  . BIOTIN PO Take 1 tablet by mouth daily.   09/11/2014 at Unknown time  . Calcium Carbonate-Vitamin D (CALCIUM + D PO) Take 1 tablet by mouth daily.   09/11/2014 at Unknown time  . levothyroxine (SYNTHROID, LEVOTHROID) 112 MCG tablet Take 112 mcg by mouth daily before breakfast.   09/12/2014 at Unknown time  . Multiple Vitamin (MULTIVITAMIN WITH MINERALS) TABS tablet Take 1 tablet by mouth daily.   09/11/2014 at Unknown time  . rosuvastatin (CRESTOR) 20 MG tablet Take 20 mg by mouth daily.   09/12/2014 at Unknown time  . warfarin (COUMADIN) 5 MG tablet Take 5 mg by mouth daily. Take 5 mg daily except take 7.5 MG on wednesdays   09/12/2014 at Unknown time     Assessment: 69 yo lady to continue coumadin for afib.  Her admit INR is therapeutic on home regimen.   Goal of Therapy:  INR 2-3 Monitor platelets by anticoagulation protocol: Yes   Plan:  Cont home coumadin dose Daily PT/INR  Thanks for allowing pharmacy to be a part of this patient's care.  Excell Seltzer, PharmD Clinical Pharmacist, [phone] 09/14/2014,9:43 AM

## 2014-09-14 NOTE — Anesthesia Postprocedure Evaluation (Signed)
  Anesthesia Post-op Note  Patient: Carly Romero  Procedure(s) Performed: Procedure(s): CARDIOVERSION (N/A)  Patient Location: PACU  Anesthesia Type:General  Level of Consciousness: awake and alert   Airway and Oxygen Therapy: Patient Spontanous Breathing  Post-op Pain: none  Post-op Assessment: Post-op Vital signs reviewed, Patient's Cardiovascular Status Stable and Respiratory Function Stable  Post-op Vital Signs: Reviewed  Filed Vitals:   09/14/14 1424  BP: 158/75  Pulse: 55  Temp:   Resp: 19    Complications: No apparent anesthesia complications

## 2014-09-14 NOTE — Anesthesia Preprocedure Evaluation (Addendum)
Anesthesia Evaluation  Patient identified by MRN, date of birth, ID band Patient awake    Reviewed: Allergy & Precautions, H&P , NPO status , Patient's Chart, lab work & pertinent test results, reviewed documented beta blocker date and time   Airway Mallampati: II  TM Distance: >3 FB Neck ROM: Full    Dental no notable dental hx. (+) Teeth Intact, Dental Advisory Given   Pulmonary neg pulmonary ROS, former smoker,  breath sounds clear to auscultation  Pulmonary exam normal       Cardiovascular hypertension, Pt. on medications and Pt. on home beta blockers + dysrhythmias Atrial Fibrillation Rhythm:Irregular Rate:Normal     Neuro/Psych negative neurological ROS  negative psych ROS   GI/Hepatic negative GI ROS, Neg liver ROS,   Endo/Other  Hypothyroidism Morbid obesity  Renal/GU negative Renal ROS  negative genitourinary   Musculoskeletal   Abdominal   Peds  Hematology negative hematology ROS (+)   Anesthesia Other Findings   Reproductive/Obstetrics negative OB ROS                           Anesthesia Physical Anesthesia Plan  ASA: III  Anesthesia Plan: General   Post-op Pain Management:    Induction: Intravenous  Airway Management Planned: Mask  Additional Equipment:   Intra-op Plan:   Post-operative Plan:   Informed Consent: I have reviewed the patients History and Physical, chart, labs and discussed the procedure including the risks, benefits and alternatives for the proposed anesthesia with the patient or authorized representative who has indicated his/her understanding and acceptance.   Dental advisory given  Plan Discussed with: CRNA  Anesthesia Plan Comments:         Anesthesia Quick Evaluation

## 2014-09-14 NOTE — CV Procedure (Signed)
Electrical Cardioversion Procedure Note  Carly Romero   69 y.o. female MRN: [id number] DOB: 10-21-1945  Today's date: 09/14/2014  Procedure: Electrical Cardioversion  Indications:  Atrial Fibrillation  Time Out: Verified patient identification, verified procedure,medications/allergies/relevent history reviewed, required imaging and test results available.  Performed  Procedure Details  The patient was NPO after midnight. Anesthesia was administered at the beside  by Dr.Fitzgerald with 40 mg of lidocaine and 70 mg of propofol.  Cardioversion was done with synchronized biphasic defibrillation with AP pads with 120 watts.  The patient converted to normal sinus rhythm. The patient tolerated the procedure well   IMPRESSION:  Successful cardioversion of atrial fibrillation    W. Tollie Eth, Brooke Bonito. MD [facility]   09/14/2014, 2:10 PM

## 2014-09-14 NOTE — Interval H&P Note (Signed)
History and Physical Interval Note:  09/14/2014 2:02 PM  Carly Romero  has presented today for surgery, with the diagnosis of afib  The various methods of treatment have been discussed with the patient and family. After consideration of risks, benefits and other options for treatment, the patient has consented to  Procedure(s): CARDIOVERSION (N/A) as a surgical intervention .  The patient's history has been reviewed, patient examined, no change in status, stable for surgery.  I have reviewed the patient's chart and labs.  Questions were answered to the patient's satisfaction.     Aras Albarran JR,W [facility]

## 2014-09-14 NOTE — Progress Notes (Signed)
Pt's post Tikosyn QTC per 12 lead EKG is 0.528. Pt asymptomatic. Dr. Tommi Rumps made aware. No new orders. Cont to monitor.

## 2014-09-14 NOTE — Anesthesia Procedure Notes (Signed)
Procedure Name: MAC Date/Time: 09/14/2014 2:00 PM Performed by: Maeola Harman Pre-anesthesia Checklist: Patient identified, Emergency Drugs available, Suction available, Patient being monitored and Timeout performed Patient Re-evaluated:Patient Re-evaluated prior to inductionOxygen Delivery Method: Ambu bag Intubation Type: IV induction Ventilation: Mask ventilation without difficulty Placement Confirmation: breath sounds checked- equal and bilateral Dental Injury: Teeth and Oropharynx as per pre-operative assessment

## 2014-09-14 NOTE — Progress Notes (Signed)
cardiologist on call notified of pt being asymptomatic but bradycardic. New orders to hold night time dose of atenolol d/t pt's hr being in the high 30s - 50s per MD Turner. Will continue to monitor the pt. Hoover Brunette, RN

## 2014-09-14 NOTE — Transfer of Care (Signed)
Immediate Anesthesia Transfer of Care Note  Patient: Carly Romero  Procedure(s) Performed: Procedure(s): CARDIOVERSION (N/A)  Patient Location: Endoscopy Unit  Anesthesia Type:General  Level of Consciousness: awake, alert  and oriented  Airway & Oxygen Therapy: Patient connected to nasal cannula oxygen  Post-op Assessment: Report given to PACU RN  Post vital signs: stable  Complications: No apparent anesthesia complications

## 2014-09-15 ENCOUNTER — Encounter (HOSPITAL_COMMUNITY): Payer: Self-pay | Admitting: Cardiology

## 2014-09-15 LAB — BASIC METABOLIC PANEL
ANION GAP: 6 (ref 5–15)
BUN: 21 mg/dL (ref 6–23)
CO2: 29 mmol/L (ref 19–32)
CREATININE: 0.8 mg/dL (ref 0.50–1.10)
Calcium: 8.6 mg/dL (ref 8.4–10.5)
Chloride: 107 mmol/L (ref 96–112)
GFR calc non Af Amer: 75 mL/min — ABNORMAL LOW (ref >90)
GFR, EST AFRICAN AMERICAN: 86 mL/min — AB (ref >90)
Glucose, Bld: 95 mg/dL (ref 70–99)
POTASSIUM: 4 mmol/L (ref 3.5–5.1)
Sodium: 142 mmol/L (ref 135–145)

## 2014-09-15 LAB — MAGNESIUM: Magnesium: 2.1 mg/dL (ref 1.5–2.5)

## 2014-09-15 LAB — PROTIME-INR
INR: 2.41 — AB (ref 0.00–1.49)
PROTHROMBIN TIME: 26.5 s — AB (ref 11.6–15.2)

## 2014-09-15 MED ORDER — ATENOLOL 25 MG PO TABS
25.0000 mg | ORAL_TABLET | Freq: Two times a day (BID) | ORAL | Status: DC
  Administered 2014-09-15: 25 mg via ORAL
  Filled 2014-09-15: qty 1

## 2014-09-15 MED ORDER — DOFETILIDE 250 MCG PO CAPS: 250.0000 ug | ORAL_CAPSULE | Freq: Two times a day (BID) | ORAL | Status: DC

## 2014-09-15 MED ORDER — ATENOLOL 50 MG PO TABS: 25.0000 mg | ORAL_TABLET | Freq: Two times a day (BID) | ORAL | Status: DC

## 2014-09-15 NOTE — Discharge Summary (Signed)
Physician Discharge Summary  Patient ID: Carly Romero MRN: [id number] DOB/AGE: [birth year] 69 y.o.  Admit date: 09/12/2014 Discharge date: 09/15/2014  Primary Physician: Dr. Milagros Evener  Primary Discharge Diagnosis:  1.  Persistent atrial fibrillation converted to sinus rhythm this admission  Secondary Discharge Diagnosis: 2.  Long-term anticoagulation with warfarin 3.  Hypertensive heart disease. 4.  Obesity. 5.  Hypothyroidism. 6.  Factor V Leiden  Procedures:  Cardioversion  Hospital Course: This 69 year old female had developed persistent atrial fibrillation.  She was cardioverted in October, but reverted back to atrial fibrillation and was significantly symptomatic with this.  She had an opinion at [facility] for ablation consideration, but they recommended she have a trial of medicine to convert her back to rhythm, because of her risk factor of factor V Leiden and not knowing how this would interact with ablation.  She thus was admitted to the hospital to initiate Tikosyn under monitoring.  After appropriate labs were drawn Tikosyn was initiated at 500 g.  She had some QT prolongation with this and the dose was reduced to 250 g that she tolerated well.  She failed to convert to sinus rhythm and on 1/27 underwent cardioversion with reversion to sinus rhythm.  She had some bradycardia the evening afterwards and her atenolol dose was reduced.  She did maintain sinus rhythm but was noted to have some PACs and this will need to be watched closely.  She was discharged home in improved condition this morning.    Discharge Exam: Blood pressure 115/73, pulse 97, temperature 97.6 F (36.4 C), temperature source Oral, resp. rate 18, height 5\' 6"  (1.676 m), weight 103.602 kg (228 lb 6.4 oz), SpO2 100 %. Weight: 103.602 kg (228 lb 6.4 oz) Lungs clear, no S3  Labs: CBC:   Lab Results  Component Value Date   WBC 12.3* 04/09/2007   HGB 15.0 04/09/2007   HCT 43.5 04/09/2007   MCV 91.5 04/09/2007   PLT 281 04/09/2007    CMP:  Recent Labs Lab 09/15/14 0535  NA 142  K 4.0  CL 107  CO2 29  BUN 21  CREATININE 0.80  CALCIUM 8.6  GLUCOSE 95   EKG: Normal sinus rhythm, QTC 0.72  Discharge Medications:   Medication List    TAKE these medications        atenolol 50 MG tablet  Commonly known as:  TENORMIN  Take 0.5 tablets (25 mg total) by mouth 2 (two) times daily.     BIOTIN PO  Take 1 tablet by mouth daily.     CALCIUM + D PO  Take 1 tablet by mouth daily.     dofetilide 250 MCG capsule  Commonly known as:  TIKOSYN  Take 1 capsule (250 mcg total) by mouth 2 (two) times daily.     levothyroxine 112 MCG tablet  Commonly known as:  SYNTHROID, LEVOTHROID  Take 112 mcg by mouth daily before breakfast.     multivitamin with minerals Tabs tablet  Take 1 tablet by mouth daily.     rosuvastatin 20 MG tablet  Commonly known as:  CRESTOR  Take 20 mg by mouth daily.     warfarin 5 MG tablet  Commonly known as:  COUMADIN  Take 5 mg by mouth daily. Take 5 mg daily except take 7.5 MG on wednesdays       Followup plans and appointments: Follow-up Dr. Wynonia Lawman in one week  Time spent with patient to include physician time:  30 minutes   Signed:  Kerry Hough. MD [facility] 09/15/2014, 9:24 AM

## 2014-10-06 ENCOUNTER — Ambulatory Visit (HOSPITAL_BASED_OUTPATIENT_CLINIC_OR_DEPARTMENT_OTHER): Payer: Medicare Other | Attending: Cardiology | Admitting: *Deleted

## 2014-10-06 VITALS — Ht 66.0 in | Wt 229.0 lb

## 2014-10-06 DIAGNOSIS — G4730 Sleep apnea, unspecified: Secondary | ICD-10-CM | POA: Diagnosis present

## 2014-10-22 DIAGNOSIS — G4730 Sleep apnea, unspecified: Secondary | ICD-10-CM

## 2014-10-22 NOTE — Sleep Study (Signed)
   NAME: Carly Romero DATE OF BIRTH:  Jan 30, 1946 MEDICAL RECORD NUMBER [id number]  LOCATION: [facility]  PHYSICIAN: Baird Lyons D  DATE OF STUDY: 10/06/2014  SLEEP STUDY TYPE: Nocturnal Polysomnogram               REFERRING PHYSICIAN: Jacolyn Reedy, MD  INDICATION FOR STUDY: Hypersomnia with sleep apnea  EPWORTH SLEEPINESS SCORE:   7/24 HEIGHT: 5\' 6"  (167.6 cm)  WEIGHT: 103.874 kg (229 lb)    Body mass index is 36.98 kg/(m^2).  NECK SIZE: 16 in.  MEDICATIONS: Charted for review  SLEEP ARCHITECTURE: Total sleep time 231.5 minutes with sleep efficiency 60.1%. Stage I was 11.4%, stage II 73%, stage III absent, REM 15.6% of total sleep time. Sleep latency 77.5 minutes, REM latency 162 minutes, awake after sleep onset 77 minutes, arousal index 32.1, bedtime medications: Atenolol, dofetilide  RESPIRATORY DATA: Apnea hypopnea index (AHI) 19.4 per hour. 75 total events scored including 47 obstructive apneas and 28 hypopneas. Non-positional events. REM AHI 71.7 per hour. The patient had difficulty sustaining sleep before 2 AM and was unable to meet the protocol criteria for split CPAP titration on this study night.  OXYGEN DATA: Moderately loud snoring with oxygen desaturation to a nadir of 83% and mean saturation 95.7% on room air  CARDIAC DATA: Atrial fibrillation with mean heart rate 112/m  MOVEMENT/PARASOMNIA: No significant movement disturbance, bathroom 2  IMPRESSION/ RECOMMENDATION:   1) Moderate obstructive sleep apnea/hypopnea syndrome, AHI 19.4 per hour with non-positional events. REM AHI 71.7 per hour area moderately loud snoring with oxygen desaturation to a nadir of 83% and mean saturation 95.7% on room air. 2) The patient had difficulty initiating and maintaining sleep prior to 2 AM. This precluded her from meeting the sleep time and number of respiratory events required by protocol for split CPAP titration on this study night. She can be scheduled  for a return CPAP titration study if appropriate. Consider allowing her to bring a sleep medication if she does return.   [name] Diplomate, American Board of Sleep Medicine  ELECTRONICALLY SIGNED ON:  10/22/2014, 2:43 PM [place] PH: [phone]   FX: [phone] [place]

## 2014-12-18 HISTORY — PX: ATRIAL FIBRILLATION ABLATION: EP1191

## 2015-04-18 ENCOUNTER — Other Ambulatory Visit: Payer: Self-pay | Admitting: Cardiology

## 2015-04-18 NOTE — H&P (Signed)
Carly Romero A  Date of visit:  04/18/2015 DOB:  [birth year]    Age:  69 yrs. Medical record number:  [id number]     Account number:  [id number] Primary Care Provider: Milagros Evener ____________________________ CURRENT DIAGNOSES  1. Persistent Atrial Fibrillation  2. Encounter for preprocedural cardiovascular examination  3. Essential (primary) hypertension  4. Hyperlipidemia  5. Sleep apnea  6. Hypothyroidism  7. Obesity  8. Long term (current) use of anticoagulants ____________________________ ALLERGIES  Atorvastatin, Intolerance-diarrhea  Chantix, Suicidal thoughts  Codeine, Swelling (non-specific) ____________________________ MEDICATIONS  1. multivitamin tablet, 1 p.o. daily  2. Synthroid 112 mcg tablet, 1 p.o. daily  3. Crestor 20 mg tablet, 1 p.o. daily  4. warfarin 5 mg tablet, Take as directed  5. Calcium 600 + D(3) 600 mg calcium-200 unit capsule, 1 p.o. daily  6. biotin 10 mg tablet, 1 p.o. daily  7. atenolol 50 mg tablet, 1/2 tab b.i.d.  8. amiodarone 200 mg tablet, BID ____________________________ CHIEF COMPLAINTS  Followup of Paroxysmal atrial fibrillation ____________________________ HISTORY OF PRESENT ILLNESS Patient seen for followup of atrial fibrillation and prior to repeat cardioversion. She underwent radiofrequency ablation at [facility] back in May. She remained in sinus rhythm for a few weeks afterward but then reverted to atrial fibrillation. She was seen later by Dr. Glennon Mac who asked her to begin amiodarone and then asked her to have a repeat cardioversion. She preferred to have this done here in [place]. I have not seen her since that worried. She has continued to have dyspnea and fatigue but does feel better when she is in sinus rhythm that she has been able to maintain only briefly. She had a sleep study in March it was diagnostic for sleep apnea but she did not sleep long enough to have a CPAP titration. She denies angina and has no PND orthopnea or  edema. Her protimes have been therapeutic for the past month. She does not some hair loss and she was started back on diltiazem but this has since been stopped. ____________________________ PAST HISTORY  Past Medical Illnesses:  hypertension, hyperlipidemia, hypothyroidism, obesity, thromboembolic disorder, DVT, Factor 5 Leiden, pulmonary embolus;  Cardiovascular Illnesses:  atrial fibrillation;  Surgical Procedures:  surg eyelids, wisdom teeth;  NYHA Classification:  I;  Canadian Angina Classification:  Class 0: Asymptomatic;  Cardiology Procedures-Invasive:  cardioversion October 2015, cardioversion January 2016, RF ablation for atrial fibrillation May 2016;  Cardiology Procedures-Noninvasive:  holter monitor, treadmill cardiolite, echocardiogram September 2015;  LVEF of 60% documented via echocardiogram on 05/09/2014,   ____________________________ CARDIO-PULMONARY TEST DATES EKG Date:  09/23/2014;  Echocardiography Date: 05/09/2014;  Chest Xray Date: 04/18/2014;   ____________________________ FAMILY HISTORY Brother -- Brother alive with problem, Hypertension Brother -- Brother dead, Alcoholism Brother -- Brother dead, Prostate cancer Brother -- Brother dead, Abdominal aortic aneurysm Father -- Father dead, Dementia/Alzheimers Mother -- Mother dead, Ulcerative colitis ____________________________ SOCIAL HISTORY Alcohol Use:  8 glasses wine q week;  Smoking:  used to smoke but quit, greater than 50 pack year history;  Diet:  regular diet;  Lifestyle:  single;  Exercise:  no regular exercise;  Occupation:  retired and Programmer, [employer];  Residence:  lives alone;   ____________________________ REVIEW OF SYSTEMS General:  obesity, malaise and fatigue Eyes: wears eye glasses/contact lenses, denies diplopia, glaucoma or visual field defects. Respiratory: dyspnea with exertion Cardiovascular:  please review HPI Abdominal: denies dyspepsia, GI bleeding, constipation, or diarrheaGenitourinary-Female:  frequency, stress incontinence, urgencyNeurological:  dizziness  ____________________________ PHYSICAL EXAMINATION VITAL SIGNS  Blood  Pressure:  136/84 Sitting, Left arm, regular cuff  , 130/80 Standing, Left arm and regular cuff   Pulse:  104/min. Weight:  208.00 lbs. Height:  66"BMI: 33  Constitutional:  pleasant white female, in no acute distress, moderately obese Skin:  warm and dry to touch, no apparent skin lesions, or masses noted. Head:  normocephalic, normal hair pattern, no masses or tenderness ENT:  ears, nose and throat reveal no gross abnormalities.  Dentition good. Neck:  supple, without massess. No JVD, thyromegaly or carotid bruits. Carotid upstroke normal. Chest:  normal symmetry, clear to auscultation. Cardiac:  irregularly irregular rhythm, normal S1 and S2, no S3 or S4, no murmur Abdomen:  abdomen soft,non-tender, no masses, no hepatospenomegaly, or aneurysm noted Extremities & Back:  1+ edema, bilateral venous insufficiency changes present Neurological:  no gross motor or sensory deficits noted, affect appropriate, oriented x3. ____________________________ MOST RECENT LIPID PANEL 10/18/14  CHOL TOTL 179 mg/dl, LDL 91 NM, HDL 58 mg/dl and TRIGLYCER 153 mg/dl ____________________________ IMPRESSIONS/PLAN  1. Persistent atrial fibrillation with recurrence following ablation 2. Hypertension 3. Long-term anticoagulation with warfarin 4. Sleep apnea currently untreated  Recommendations:  Cardioversion discussed with the patient including risks of stroke, arrhythmia, death, or anesthesia risks. The patient understands and is willing to proceed. Because of her schedule and my schedule will need to be done in a couple of weeks. In the meantime I've asked her to have a CPAP titration study and will obtain lab work for the cardioversion. Continue amiodarone 200 mg twice daily. EKG shows atrial fibrillation with mildly rapid response.  ____________________________ TODAYS  ORDERS  1. 12 Lead EKG: Today  2. Comprehensive Metabolic Panel: Today  3. Complete Blood Count: Today                       ____________________________ Cardiology Physician:  Kerry Hough MD [facility]

## 2015-05-11 ENCOUNTER — Ambulatory Visit (HOSPITAL_COMMUNITY)
Admission: RE | Admit: 2015-05-11 | Discharge: 2015-05-11 | Disposition: A | Discharge status: Home / Self Care | Payer: Medicare Other | Source: Ambulatory Visit | Attending: Cardiology | Admitting: Cardiology

## 2015-05-11 ENCOUNTER — Encounter (HOSPITAL_COMMUNITY)
Admission: RE | Disposition: A | Discharge status: Home / Self Care | Payer: Self-pay | Source: Ambulatory Visit | Attending: Cardiology

## 2015-05-11 ENCOUNTER — Ambulatory Visit (HOSPITAL_COMMUNITY): Payer: Medicare Other | Admitting: Anesthesiology

## 2015-05-11 ENCOUNTER — Encounter (HOSPITAL_COMMUNITY): Payer: Self-pay | Admitting: Anesthesiology

## 2015-05-11 DIAGNOSIS — Z6836 Body mass index (BMI) 36.0-36.9, adult: Secondary | ICD-10-CM | POA: Diagnosis not present

## 2015-05-11 DIAGNOSIS — E669 Obesity, unspecified: Secondary | ICD-10-CM | POA: Insufficient documentation

## 2015-05-11 DIAGNOSIS — Z7901 Long term (current) use of anticoagulants: Secondary | ICD-10-CM | POA: Insufficient documentation

## 2015-05-11 DIAGNOSIS — Z86711 Personal history of pulmonary embolism: Secondary | ICD-10-CM | POA: Insufficient documentation

## 2015-05-11 DIAGNOSIS — I4891 Unspecified atrial fibrillation: Secondary | ICD-10-CM | POA: Diagnosis present

## 2015-05-11 DIAGNOSIS — I10 Essential (primary) hypertension: Secondary | ICD-10-CM | POA: Diagnosis not present

## 2015-05-11 DIAGNOSIS — Z86718 Personal history of other venous thrombosis and embolism: Secondary | ICD-10-CM | POA: Insufficient documentation

## 2015-05-11 DIAGNOSIS — Z87891 Personal history of nicotine dependence: Secondary | ICD-10-CM | POA: Diagnosis not present

## 2015-05-11 DIAGNOSIS — G4730 Sleep apnea, unspecified: Secondary | ICD-10-CM | POA: Insufficient documentation

## 2015-05-11 DIAGNOSIS — Z79899 Other long term (current) drug therapy: Secondary | ICD-10-CM | POA: Diagnosis not present

## 2015-05-11 DIAGNOSIS — E785 Hyperlipidemia, unspecified: Secondary | ICD-10-CM | POA: Diagnosis not present

## 2015-05-11 DIAGNOSIS — E039 Hypothyroidism, unspecified: Secondary | ICD-10-CM | POA: Insufficient documentation

## 2015-05-11 DIAGNOSIS — I481 Persistent atrial fibrillation: Secondary | ICD-10-CM | POA: Insufficient documentation

## 2015-05-11 HISTORY — PX: CARDIOVERSION: SHX1299

## 2015-05-11 SURGERY — CARDIOVERSION
Anesthesia: Monitor Anesthesia Care

## 2015-05-11 MED ORDER — PROPOFOL 10 MG/ML IV BOLUS
INTRAVENOUS | Status: DC | PRN
  Administered 2015-05-11: 60 mg via INTRAVENOUS

## 2015-05-11 MED ORDER — ATENOLOL 25 MG PO TABS: 25.0000 mg | ORAL_TABLET | Freq: Every day | ORAL | Status: DC

## 2015-05-11 MED ORDER — LIDOCAINE HCL (CARDIAC) 20 MG/ML IV SOLN
INTRAVENOUS | Status: DC | PRN
  Administered 2015-05-11: 60 mg via INTRAVENOUS

## 2015-05-11 MED ORDER — SODIUM CHLORIDE 0.9 % IV SOLN
INTRAVENOUS | Status: DC | PRN
  Administered 2015-05-11: 13:00:00 via INTRAVENOUS

## 2015-05-11 NOTE — CV Procedure (Signed)
Electrical Cardioversion Procedure Note  Carly Romero   69 y.o. female MRN: [id number] DOB: [birth year]  Today's date: 05/11/2015  Procedure: Electrical Cardioversion  Indications:  Atrial Fibrillation  Time Out: Verified patient identification, verified procedure,medications/allergies/relevent history reviewed, required imaging and test results available.  Performed  Procedure Details  The patient was NPO after midnight. Anesthesia was administered at the beside  by Dr. Smith Robert with 60 mg of lidocaine and 60 mg  of propofol.  Cardioversion was done with synchronized biphasic defibrillation with AP pads with 120watts.  The patient converted to sinus bradycardia and junctional rhythm.  The patient tolerated the procedure well   IMPRESSION:  Successful cardioversion of atrial fibrillation    W. Tollie Eth, Brooke Bonito. MD [facility]   05/11/2015, 1:52 PM

## 2015-05-11 NOTE — Anesthesia Postprocedure Evaluation (Signed)
  Anesthesia Post-op Note  Patient: Carly Romero  Procedure(s) Performed: Procedure(s): CARDIOVERSION (N/A)  Patient Location: PACU  Anesthesia Type:MAC  Level of Consciousness: awake, alert  and oriented  Airway and Oxygen Therapy: Patient Spontanous Breathing  Post-op Pain: none  Post-op Assessment: Post-op Vital signs reviewed and Patient's Cardiovascular Status Stable              Post-op Vital Signs: Reviewed and stable  Last Vitals:  Filed Vitals:   05/11/15 1430  BP: 184/68  Pulse: 41  Temp:   Resp: 17    Complications: No apparent anesthesia complications

## 2015-05-11 NOTE — Discharge Instructions (Addendum)
Reduce atenolol to 1/2 of a 50 mg tablet (25 mg) once a day.  Check pulse and hold medication if pulse is less than 60.   Electrical Cardioversion, Care After Refer to this sheet in the next few weeks. These instructions provide you with information on caring for yourself after your procedure. Your health care provider may also give you more specific instructions. Your treatment has been planned according to current medical practices, but problems sometimes occur. Call your health care provider if you have any problems or questions after your procedure. WHAT TO EXPECT AFTER THE PROCEDURE After your procedure, it is typical to have the following sensations:  Some redness on the skin where the shocks were delivered. If this is tender, a sunburn lotion or hydrocortisone cream may help.  Possible return of an abnormal heart rhythm within hours or days after the procedure. HOME CARE INSTRUCTIONS  Take medicines only as directed by your health care provider. Be sure you understand how and when to take your medicine.  Learn how to feel your pulse and check it often.  Limit your activity for 48 hours after the procedure or as directed by your health care provider.  Avoid or minimize caffeine and other stimulants as directed by your health care provider. SEEK MEDICAL CARE IF:  You feel like your heart is beating too fast or your pulse is not regular.  You have any questions about your medicines.  You have bleeding that will not stop. SEEK IMMEDIATE MEDICAL CARE IF:  You are dizzy or feel faint.  It is hard to breathe or you feel short of breath.  There is a change in discomfort in your chest.  Your speech is slurred or you have trouble moving an arm or leg on one side of your body.  You get a serious muscle cramp that does not go away.  Your fingers or toes turn cold or blue. Document Released: 05/26/2013 Document Revised: 12/20/2013 Document Reviewed: 05/26/2013 [facility] Patient  Information 2015 [place], [place]. This information is not intended to replace advice given to you by your health care provider. Make sure you discuss any questions you have with your health care provider.

## 2015-05-11 NOTE — Anesthesia Preprocedure Evaluation (Addendum)
Anesthesia Evaluation  Patient identified by MRN, date of birth, ID band Patient awake    Reviewed: Allergy & Precautions, NPO status , Patient's Chart, lab work & pertinent test results, reviewed documented beta blocker date and time   Airway Mallampati: II  TM Distance: >3 FB Neck ROM: Full    Dental  (+) Teeth Intact   Pulmonary former smoker,    breath sounds clear to auscultation       Cardiovascular hypertension, Pt. on medications and Pt. on home beta blockers  Rhythm:Regular Rate:Normal     Neuro/Psych negative neurological ROS  negative psych ROS   GI/Hepatic   Endo/Other  Hypothyroidism   Renal/GU   negative genitourinary   Musculoskeletal negative musculoskeletal ROS (+)   Abdominal   Peds negative pediatric ROS (+)  Hematology negative hematology ROS (+)   Anesthesia Other Findings   Reproductive/Obstetrics negative OB ROS                            Lab Results  Component Value Date   WBC 12.3* 04/09/2007   HGB 15.0 04/09/2007   HCT 43.5 04/09/2007   MCV 91.5 04/09/2007   PLT 281 04/09/2007   Lab Results  Component Value Date   INR 2.41* 09/15/2014   INR 2.11* 09/14/2014   INR 2.58* 09/13/2014   Lab Results  Component Value Date   CREATININE 0.80 09/15/2014   BUN 21 09/15/2014   NA 142 09/15/2014   K 4.0 09/15/2014   CL 107 09/15/2014   CO2 29 09/15/2014   EKG: atrial fibrillation, rate 67.   Anesthesia Physical Anesthesia Plan  ASA: III  Anesthesia Plan: MAC   Post-op Pain Management:    Induction: Intravenous  Airway Management Planned: Natural Airway and Simple Face Mask  Additional Equipment:   Intra-op Plan:   Post-operative Plan:   Informed Consent: I have reviewed the patients History and Physical, chart, labs and discussed the procedure including the risks, benefits and alternatives for the proposed anesthesia with the patient or  authorized representative who has indicated his/her understanding and acceptance.     Plan Discussed with: CRNA  Anesthesia Plan Comments:         Anesthesia Quick Evaluation

## 2015-05-11 NOTE — Interval H&P Note (Signed)
History and Physical Interval Note:  05/11/2015 1:37 PM  Carly Romero  has presented today for surgery, with the diagnosis of afib  The various methods of treatment have been discussed with the patient and family. After consideration of risks, benefits and other options for treatment, the patient has consented to  Procedure(s): CARDIOVERSION (N/A) as a surgical intervention .  The patient's history has been reviewed, patient examined, no change in status, stable for surgery.  I have reviewed the patient's chart and labs.  Questions were answered to the patient's satisfaction.     Ezzard Standing

## 2015-05-11 NOTE — Transfer of Care (Signed)
Immediate Anesthesia Transfer of Care Note  Patient: Carly Romero  Procedure(s) Performed: Procedure(s): CARDIOVERSION (N/A)  Patient Location: PACU and Endoscopy Unit  Anesthesia Type:MAC  Level of Consciousness: awake, alert  and oriented  Airway & Oxygen Therapy: Patient Spontanous Breathing and Patient connected to nasal cannula oxygen  Post-op Assessment: Report given to RN and Post -op Vital signs reviewed and stable  Post vital signs: Reviewed and stable  Last Vitals:  Filed Vitals:   05/11/15 1350  BP:   Pulse:   Temp:   Resp: 15    Complications: No apparent anesthesia complications

## 2015-05-12 ENCOUNTER — Observation Stay (HOSPITAL_COMMUNITY)
Admission: AD | Admit: 2015-05-12 | Discharge: 2015-05-13 | Disposition: A | Discharge status: Home / Self Care | Payer: Medicare Other | Source: Ambulatory Visit | Attending: Cardiology | Admitting: Cardiology

## 2015-05-12 ENCOUNTER — Encounter (HOSPITAL_COMMUNITY): Payer: Self-pay | Admitting: Cardiology

## 2015-05-12 ENCOUNTER — Observation Stay (HOSPITAL_BASED_OUTPATIENT_CLINIC_OR_DEPARTMENT_OTHER): Payer: Medicare Other

## 2015-05-12 DIAGNOSIS — J90 Pleural effusion, not elsewhere classified: Secondary | ICD-10-CM | POA: Insufficient documentation

## 2015-05-12 DIAGNOSIS — Z885 Allergy status to narcotic agent status: Secondary | ICD-10-CM | POA: Insufficient documentation

## 2015-05-12 DIAGNOSIS — Z7901 Long term (current) use of anticoagulants: Secondary | ICD-10-CM | POA: Diagnosis not present

## 2015-05-12 DIAGNOSIS — R0600 Dyspnea, unspecified: Secondary | ICD-10-CM

## 2015-05-12 DIAGNOSIS — Z86711 Personal history of pulmonary embolism: Secondary | ICD-10-CM | POA: Diagnosis not present

## 2015-05-12 DIAGNOSIS — Z87891 Personal history of nicotine dependence: Secondary | ICD-10-CM | POA: Insufficient documentation

## 2015-05-12 DIAGNOSIS — Z6833 Body mass index (BMI) 33.0-33.9, adult: Secondary | ICD-10-CM | POA: Insufficient documentation

## 2015-05-12 DIAGNOSIS — Z888 Allergy status to other drugs, medicaments and biological substances status: Secondary | ICD-10-CM | POA: Insufficient documentation

## 2015-05-12 DIAGNOSIS — G4730 Sleep apnea, unspecified: Secondary | ICD-10-CM | POA: Diagnosis not present

## 2015-05-12 DIAGNOSIS — E039 Hypothyroidism, unspecified: Secondary | ICD-10-CM | POA: Diagnosis not present

## 2015-05-12 DIAGNOSIS — E669 Obesity, unspecified: Secondary | ICD-10-CM | POA: Diagnosis not present

## 2015-05-12 DIAGNOSIS — R0609 Other forms of dyspnea: Secondary | ICD-10-CM

## 2015-05-12 DIAGNOSIS — I110 Hypertensive heart disease with heart failure: Secondary | ICD-10-CM | POA: Diagnosis not present

## 2015-05-12 DIAGNOSIS — I5031 Acute diastolic (congestive) heart failure: Secondary | ICD-10-CM | POA: Insufficient documentation

## 2015-05-12 DIAGNOSIS — E785 Hyperlipidemia, unspecified: Secondary | ICD-10-CM | POA: Insufficient documentation

## 2015-05-12 DIAGNOSIS — I481 Persistent atrial fibrillation: Secondary | ICD-10-CM | POA: Diagnosis present

## 2015-05-12 DIAGNOSIS — D6851 Activated protein C resistance: Secondary | ICD-10-CM | POA: Insufficient documentation

## 2015-05-12 LAB — CBC WITH DIFFERENTIAL/PLATELET
Basophils Absolute: 0 10*3/uL (ref 0.0–0.1)
Basophils Relative: 0 %
Eosinophils Absolute: 0.1 10*3/uL (ref 0.0–0.7)
Eosinophils Relative: 1 %
HEMATOCRIT: 36.6 % (ref 36.0–46.0)
HEMOGLOBIN: 11.7 g/dL — AB (ref 12.0–15.0)
LYMPHS ABS: 1.9 10*3/uL (ref 0.7–4.0)
LYMPHS PCT: 16 %
MCH: 29.7 pg (ref 26.0–34.0)
MCHC: 32 g/dL (ref 30.0–36.0)
MCV: 92.9 fL (ref 78.0–100.0)
MONOS PCT: 9 %
Monocytes Absolute: 1.1 10*3/uL — ABNORMAL HIGH (ref 0.1–1.0)
NEUTROS ABS: 8.6 10*3/uL — AB (ref 1.7–7.7)
NEUTROS PCT: 74 %
Platelets: 217 10*3/uL (ref 150–400)
RBC: 3.94 MIL/uL (ref 3.87–5.11)
RDW: 14.6 % (ref 11.5–15.5)
WBC: 11.6 10*3/uL — AB (ref 4.0–10.5)

## 2015-05-12 LAB — COMPREHENSIVE METABOLIC PANEL
ALK PHOS: 67 U/L (ref 38–126)
ALT: 27 U/L (ref 14–54)
AST: 29 U/L (ref 15–41)
Albumin: 3.3 g/dL — ABNORMAL LOW (ref 3.5–5.0)
Anion gap: 6 (ref 5–15)
BILIRUBIN TOTAL: 1.7 mg/dL — AB (ref 0.3–1.2)
BUN: 17 mg/dL (ref 6–20)
CALCIUM: 9 mg/dL (ref 8.9–10.3)
CO2: 30 mmol/L (ref 22–32)
CREATININE: 0.68 mg/dL (ref 0.44–1.00)
Chloride: 109 mmol/L (ref 101–111)
Glucose, Bld: 115 mg/dL — ABNORMAL HIGH (ref 65–99)
Potassium: 3.7 mmol/L (ref 3.5–5.1)
SODIUM: 145 mmol/L (ref 135–145)
TOTAL PROTEIN: 6 g/dL — AB (ref 6.5–8.1)

## 2015-05-12 LAB — PROTIME-INR
INR: 3.06 — AB (ref 0.00–1.49)
Prothrombin Time: 31.1 seconds — ABNORMAL HIGH (ref 11.6–15.2)

## 2015-05-12 LAB — BRAIN NATRIURETIC PEPTIDE: B Natriuretic Peptide: 391.3 pg/mL — ABNORMAL HIGH (ref 0.0–100.0)

## 2015-05-12 LAB — TSH: TSH: 2.159 u[IU]/mL (ref 0.350–4.500)

## 2015-05-12 MED ORDER — SODIUM CHLORIDE 0.9 % IJ SOLN: 3.0000 mL | INTRAMUSCULAR | Status: DC | PRN

## 2015-05-12 MED ORDER — INFLUENZA VAC SPLIT QUAD 0.5 ML IM SUSY
0.5000 mL | PREFILLED_SYRINGE | INTRAMUSCULAR | Status: AC
  Administered 2015-05-13: 0.5 mL via INTRAMUSCULAR
  Filled 2015-05-12: qty 0.5

## 2015-05-12 MED ORDER — ADULT MULTIVITAMIN W/MINERALS CH
1.0000 | ORAL_TABLET | Freq: Every day | ORAL | Status: DC
  Administered 2015-05-12 – 2015-05-13 (×2): 1 via ORAL
  Filled 2015-05-12 (×2): qty 1

## 2015-05-12 MED ORDER — LORATADINE 10 MG PO TABS
10.0000 mg | ORAL_TABLET | Freq: Every day | ORAL | Status: DC
  Administered 2015-05-13: 10 mg via ORAL
  Filled 2015-05-12 (×2): qty 1

## 2015-05-12 MED ORDER — ACETAMINOPHEN 325 MG PO TABS
650.0000 mg | ORAL_TABLET | ORAL | Status: DC | PRN
  Administered 2015-05-13: 650 mg via ORAL
  Filled 2015-05-12: qty 2

## 2015-05-12 MED ORDER — ROSUVASTATIN CALCIUM 20 MG PO TABS
20.0000 mg | ORAL_TABLET | Freq: Every day | ORAL | Status: DC
  Filled 2015-05-12: qty 1

## 2015-05-12 MED ORDER — SODIUM CHLORIDE 0.9 % IJ SOLN
3.0000 mL | Freq: Two times a day (BID) | INTRAMUSCULAR | Status: DC
  Administered 2015-05-12 – 2015-05-13 (×3): 3 mL via INTRAVENOUS

## 2015-05-12 MED ORDER — SODIUM CHLORIDE 0.9 % IV SOLN: 250.0000 mL | INTRAVENOUS | Status: DC | PRN

## 2015-05-12 MED ORDER — WARFARIN - PHARMACIST DOSING INPATIENT
Freq: Every day | Status: DC
Start: 2015-05-13 — End: 2015-05-13

## 2015-05-12 MED ORDER — LEVOTHYROXINE SODIUM 112 MCG PO TABS
112.0000 ug | ORAL_TABLET | Freq: Every day | ORAL | Status: DC
  Administered 2015-05-13: 112 ug via ORAL
  Filled 2015-05-12: qty 1

## 2015-05-12 MED ORDER — AMIODARONE HCL 200 MG PO TABS
200.0000 mg | ORAL_TABLET | Freq: Every day | ORAL | Status: DC
  Administered 2015-05-13: 200 mg via ORAL
  Filled 2015-05-12 (×2): qty 1

## 2015-05-12 MED ORDER — ONDANSETRON HCL 4 MG/2ML IJ SOLN: 4.0000 mg | Freq: Four times a day (QID) | INTRAMUSCULAR | Status: DC | PRN

## 2015-05-12 MED ORDER — FLUOXETINE HCL 20 MG PO TABS
20.0000 mg | ORAL_TABLET | Freq: Every day | ORAL | Status: DC
  Filled 2015-05-12: qty 1

## 2015-05-12 MED ORDER — FLUOXETINE HCL 20 MG PO TABS
20.0000 mg | ORAL_TABLET | Freq: Every day | ORAL | Status: DC
  Administered 2015-05-13: 20 mg via ORAL
  Filled 2015-05-12 (×3): qty 1

## 2015-05-12 MED ORDER — FUROSEMIDE 10 MG/ML IJ SOLN
40.0000 mg | Freq: Two times a day (BID) | INTRAMUSCULAR | Status: DC
  Administered 2015-05-12 – 2015-05-13 (×2): 40 mg via INTRAVENOUS
  Filled 2015-05-12 (×2): qty 4

## 2015-05-12 MED ORDER — WARFARIN SODIUM 4 MG PO TABS
2.0000 mg | ORAL_TABLET | Freq: Once | ORAL | Status: DC
Start: 2015-05-12 — End: 2015-05-13

## 2015-05-12 NOTE — H&P (Signed)
Carly Romero A  Date of visit:  05/12/2015 DOB:  28-Jun-1946    Age:  69 yrs. Medical record number:  [id number]     Account number:  [id number] Primary Care Provider: Milagros Evener ____________________________ CURRENT DIAGNOSES  1. Persistent Atrial Fibrillation  2. Dyspnea  3. Essential (primary) hypertension  4. Hyperlipidemia  5. Sleep apnea  6. Hypothyroidism  7. Obesity  8. Long term (current) use of anticoagulants ____________________________ ALLERGIES  Atorvastatin, Intolerance-diarrhea  Chantix, Suicidal thoughts  Codeine, Swelling (non-specific) ____________________________ MEDICATIONS  1. multivitamin tablet, 1 p.o. daily  2. Synthroid 112 mcg tablet, 1 p.o. daily  3. Crestor 20 mg tablet, 1 p.o. daily  4. warfarin 5 mg tablet, Take as directed  5. Calcium 600 + D(3) 600 mg calcium-200 unit capsule, 1 p.o. daily  6. biotin 10 mg tablet, 1 p.o. daily  7. amiodarone 200 mg tablet, 1 p.o. daily ____________________________ CHIEF COMPLAINTS  Dyspnea with exertion ____________________________ HISTORY OF PRESENT ILLNESS Patient is admitted to the hospital for evaluation of dyspnea following cardioversion yesterday. She has a history of persistent atrial fibrillation and failed cardioversion and treatment with Tikosyn. At her request she went to [facility] and underwent ablation and May of this year. Following this she had recurrence of atrial fibrillation was placed on amiodarone and I was asked that she be cardioverted. She underwent cardioversion yesterday successfully but was in a severe bradycardic rhythm of 40 after the cardioversion. She felt fine after the cardioversion and I opted to send her home and to stop her atenolol if the pulse was less than 60. She has had no further atenolol but about 3 hours after going home developed worsening shortness of breath. She was dyspneic over night and did not sleep well and called the office this morning complaining of severe  dyspnea. She continued to complain of dyspnea throughout the day and when seen in the office was in sinus rhythm. She is admitted for significant dyspnea and possible heart failure following her cardioversion yesterday since she lives alone. She has no angina. She denies PND, but did have some edema when she was out of town earlier. ____________________________ PAST HISTORY  Past Medical Illnesses:  hypertension, hyperlipidemia, hypothyroidism, obesity, thromboembolic disorder, DVT, Factor 5 Leiden, pulmonary embolus;  Cardiovascular Illnesses:  atrial fibrillation;  Surgical Procedures:  surg eyelids, wisdom teeth;  NYHA Classification:  I;  Canadian Angina Classification:  Class 0: Asymptomatic;  Cardiology Procedures-Invasive:  cardioversion October 2015, cardioversion January 2016, RF ablation for atrial fibrillation May 2016, cardioversion September 2016;  Cardiology Procedures-Noninvasive:  holter monitor, treadmill cardiolite, echocardiogram September 2015;  LVEF of 60% documented via echocardiogram on 05/09/2014,   ____________________________ CARDIO-PULMONARY TEST DATES EKG Date:  09/23/2014;  Echocardiography Date: 05/09/2014;  Chest Xray Date: 04/18/2014;   ____________________________ FAMILY HISTORY Brother -- Brother alive with problem, Hypertension Brother -- Brother dead, Alcoholism Brother -- Brother dead, Prostate cancer Brother -- Brother dead, Abdominal aortic aneurysm Father -- Father dead, Dementia/Alzheimers Mother -- Mother dead, Ulcerative colitis ____________________________ SOCIAL HISTORY Alcohol Use:  8 glasses wine q week;  Smoking:  used to smoke but quit, greater than 50 pack year history;  Diet:  regular diet;  Lifestyle:  single;  Exercise:  no regular exercise;  Occupation:  retired and Programmer, [employer];  Residence:  lives alone;   ____________________________ REVIEW OF SYSTEMS General:  obesity, malaise and fatigue Eyes: wears eye glasses/contact lenses, denies  diplopia, glaucoma or visual field defects. Respiratory: dyspnea with exertion Cardiovascular:  please  review HPI Abdominal: denies dyspepsia, GI bleeding, constipation, or diarrheaGenitourinary-Female: frequency, stress incontinence, urgency Musculoskeletal:  denies arthritis, venous insufficiency, or muscle weakness Neurological:  denies headaches, stroke, or TIA  ____________________________ PHYSICAL EXAMINATION VITAL SIGNS  Blood Pressure:  220/78 Sitting, Right arm, large cuff  , 210/84 Supine, Right arm and large cuff   Pulse:  68/min. Respirations:  26/min. Weight:  210.00 lbs. Height:  66"BMI: 34  Constitutional:  pleasant white female, in no acute distress, moderately obese Skin:  warm and dry to touch, no apparent skin lesions, or masses noted. Head:  normocephalic, normal hair pattern, no masses or tenderness ENT:  ears, nose and throat reveal no gross abnormalities.  Dentition good. Neck:  supple, without massess. No JVD, thyromegaly or carotid bruits. Carotid upstroke normal. Chest:  normal symmetry, clear to auscultation. Cardiac:  regular rhythm, normal S1 and S2, no S3 or S4, no murmur Abdomen:  abdomen soft,non-tender, no masses, no hepatospenomegaly, or aneurysm noted Extremities & Back:  1+ edema, bilateral venous insufficiency changes present Neurological:  no gross motor or sensory deficits noted, affect appropriate, oriented x3. ____________________________ MOST RECENT LIPID PANEL 10/18/14  CHOL TOTL 179 mg/dl, LDL 91 NM, HDL 58 mg/dl and TRIGLYCER 153 mg/dl ____________________________ IMPRESSIONS/PLAN  1. Unexplained dyspnea following cardioversion 2. History of factor V Leiden 3. History of pulmonary emboli in the past 4. Hypertension currently above goal 5. Hyperlipidemia under treatment  Recommendations:  Bring in for additional lab work, repeat chest x-ray and repeat echo. Check BNP level and if elevated diurese. Watch blood pressure. Continue to monitor  rhythm off of the atenolol.   ____________________________ Cardiology Physician:  Kerry Hough MD [facility]

## 2015-05-12 NOTE — Progress Notes (Signed)
Pt direct admit for SOB, pt a/o, no c/o pain, pt SOB at rest 2L applied, pt oriented to unit, EKG done, pt stable

## 2015-05-12 NOTE — Progress Notes (Signed)
  Echocardiogram 2D Echocardiogram has been performed.  Carly Romero 05/12/2015, 3:43 PM

## 2015-05-12 NOTE — Progress Notes (Addendum)
ANTICOAGULATION CONSULT NOTE - Initial Consult  Pharmacy Consult for Coumadin Indication: atrial fibrillation, PE history, factor V leiden  Allergies  Allergen Reactions  . Chantix [Varenicline]     Suicidal thoughts  . Codeine Swelling    Swelling as a young adult  . Lipitor [Atorvastatin] Diarrhea    And cramping.    Patient Measurements: Height: 5\' 6"  (167.6 cm) Weight: 209 lb 14.1 oz (95.2 kg) IBW/kg (Calculated) : 59.3  Vital Signs: Temp: 98.2 F (36.8 C) (09/23 1423) Temp Source: Oral (09/23 1423) BP: 185/56 mmHg (09/23 1423) Pulse Rate: 56 (09/23 1423)  Labs:  Recent Labs  05/12/15 1540  HGB 11.7*  HCT 36.6  PLT 217  LABPROT 31.1*  INR 3.06*  CREATININE 0.68    Estimated Creatinine Clearance: 78.3 mL/min (by C-G formula based on Cr of 0.68).   Medical History: Past Medical History  Diagnosis Date  . Hypertension   . Hyperlipidemia   . Factor V Leiden   . Thyroid disease   . Atrial fibrillation   . Obesity (BMI 30-39.9)   . Hypertensive heart disease   . Hypothyroidism     Assessment: 69 year old female to continue PTA Coumadin for Afib, PE history, Factor V leiden Admitted with SOB following cardioversion Dose PTA = 2.5 mg MWF, 5 mg TTSS with admit INR of 3.06 last dose 9/21  Goal of Therapy:  INR 2-3 Monitor platelets by anticoagulation protocol: Yes   Plan:  Coumadin 2 mg po x 1 dose tonight Daily INR  Thank you [name], PharmD [phone]  05/12/2015,6:26 PM

## 2015-05-13 ENCOUNTER — Observation Stay (HOSPITAL_COMMUNITY): Payer: Medicare Other

## 2015-05-13 DIAGNOSIS — D6851 Activated protein C resistance: Secondary | ICD-10-CM | POA: Diagnosis not present

## 2015-05-13 DIAGNOSIS — I481 Persistent atrial fibrillation: Secondary | ICD-10-CM | POA: Diagnosis not present

## 2015-05-13 DIAGNOSIS — I110 Hypertensive heart disease with heart failure: Secondary | ICD-10-CM | POA: Diagnosis not present

## 2015-05-13 DIAGNOSIS — I5031 Acute diastolic (congestive) heart failure: Secondary | ICD-10-CM | POA: Diagnosis not present

## 2015-05-13 LAB — PROTIME-INR
INR: 2.84 — ABNORMAL HIGH (ref 0.00–1.49)
Prothrombin Time: 29.3 seconds — ABNORMAL HIGH (ref 11.6–15.2)

## 2015-05-13 LAB — BASIC METABOLIC PANEL
ANION GAP: 9 (ref 5–15)
BUN: 14 mg/dL (ref 6–20)
CHLORIDE: 102 mmol/L (ref 101–111)
CO2: 30 mmol/L (ref 22–32)
Calcium: 8.6 mg/dL — ABNORMAL LOW (ref 8.9–10.3)
Creatinine, Ser: 0.66 mg/dL (ref 0.44–1.00)
GFR calc Af Amer: >60 mL/min (ref >60)
GLUCOSE: 93 mg/dL (ref 65–99)
POTASSIUM: 3.2 mmol/L — AB (ref 3.5–5.1)
Sodium: 141 mmol/L (ref 135–145)

## 2015-05-13 MED ORDER — POTASSIUM CHLORIDE ER 10 MEQ PO TBCR: 10.0000 meq | EXTENDED_RELEASE_TABLET | Freq: Two times a day (BID) | ORAL | Status: DC

## 2015-05-13 MED ORDER — FUROSEMIDE 20 MG PO TABS: 20.0000 mg | ORAL_TABLET | Freq: Every day | ORAL | Status: DC

## 2015-05-13 MED ORDER — POTASSIUM CHLORIDE 20 MEQ/15ML (10%) PO SOLN
40.0000 meq | Freq: Once | ORAL | Status: AC
  Administered 2015-05-13: 40 meq via ORAL
  Filled 2015-05-13: qty 30

## 2015-05-13 MED ORDER — ATENOLOL 50 MG PO TABS: 50.0000 mg | ORAL_TABLET | Freq: Every day | ORAL | Status: DC

## 2015-05-13 MED ORDER — AMIODARONE HCL 200 MG PO TABS
200.0000 mg | ORAL_TABLET | Freq: Two times a day (BID) | ORAL | Status: DC
Start: 2015-05-13 — End: 2018-01-14

## 2015-05-13 NOTE — Progress Notes (Signed)
Notified from CMT that patient had converted to A.fib. Waited for EKG from nurse Tech and EKG confirmed that patient was in A.fib. Text page Dr. Percival Spanish. Awaiting page. Will continue to monitor patient to end of shift.

## 2015-05-13 NOTE — Care Management Note (Signed)
Case Management Note  Patient Details  Name: Danity Schmelzer MRN: [id number] Date of Birth: 07/17/1946  Subjective/Objective:                  Acute diastolic congestive heart failure   Action/Plan: Discharge planning  Expected Discharge Date:  05/13/15               Expected Discharge Plan:  Home/Self Care  In-House Referral:     Discharge planning Services  CM Consult  Post Acute Care Choice:    Choice offered to:     DME Arranged:    DME Agency:     HH Arranged:    [place] Agency:     Status of Service:     Medicare Important Message Given:    Date Medicare IM Given:    Medicare IM give by:    Date Additional Medicare IM Given:    Additional Medicare Important Message give by:     If discussed at [place] of Stay Meetings, dates discussed:    Additional Comments: Cm notes no HH services (or F2F) placed per previous CM request of MD.  NO other CM needs were communicated. Dellie Catholic, RN 05/13/2015, 1:40 PM

## 2015-05-13 NOTE — Discharge Summary (Signed)
Physician Discharge Summary  Patient ID: Carly Romero MRN: [id number] DOB/AGE: [birth year] 69 y.o.  Admit date: 05/12/2015 Discharge date: 05/13/2015  Primary Physician:  Dr. Milagros Evener  Primary Discharge Diagnosis:  1.  Acute diastolic congestive heart failure resolved  Secondary Discharge Diagnosis: 2.  Persistent atrial fibrillation with recent cardioversion but reversion to atrial fibrillation 3.  Factor V Leiden 4.  Long-term use of anticoagulates 5.  Hypertensive heart disease 6.  Bradycardia following cardioversion  Procedures: Echocardiogram  Hospital Course: Patient was admitted to the hospital for evaluation of dyspnea following cardioversion the day prior to admission.  She has a history of persistent atrial fibrillation and failed cardioversion and treatment with Tikosyn. At her request she went to [facility] and underwent ablation and May of this year. Following this she had recurrence of atrial fibrillation was placed on amiodarone and I was asked that she be cardioverted. She underwent cardioversion yesterday successfully but was in a severe bradycardic rhythm of 40 after the cardioversion. She felt fine after the cardioversion and I opted to send her home and to stop her atenolol if the pulse was less than 60. She has had no further atenolol but about 3 hours after going home developed worsening shortness of breath. She was dyspneic over night and did not sleep well and called the office this morning complaining of severe dyspnea. She continued to complain of dyspnea throughout the day and when seen in the office was in sinus rhythm. She is admitted for significant dyspnea and possible heart failure following her cardioversion yesterday since she lives alone. She has no angina. She denies PND, but did have some edema when she was out of town earlier.  The patient was brought into the hospital with worsening dyspnea.  Chest x-ray showed a small right pleural effusion.   She had mild edema.  BNP was mildly elevated.  Echocardiogram showed concentric LVH and preserved systolic function.  She was diuresed with Lasix and had marked improvement in her breathing.  She was in sinus rhythm on admission but her rhythm converted to atrial fibrillation the next morning.  Her breathing had markedly improved and I'm going to plan to let her go home today on Lasix plus potassium.  Potassium is repleted prior to discharge.  She'll need to follow-up with her electrophysiologist regarding further plans for the atrial fibrillation.  Restart atenolol at 50 mg daily.  Follow-up with me in one week.  Discharge Exam: Blood pressure 162/83, pulse 95, temperature 98.4 F (36.9 C), temperature source Oral, resp. rate 20, height 5\' 6"  (1.676 m), weight 93.35 kg (205 lb 12.8 oz), SpO2 99 %. Weight: 93.35 kg (205 lb 12.8 oz) (Scale A) Lungs were clear on discharge, irregular rhythm  Labs: CBC:   Lab Results  Component Value Date   WBC 11.6* 05/12/2015   HGB 11.7* 05/12/2015   HCT 36.6 05/12/2015   MCV 92.9 05/12/2015   PLT 217 05/12/2015    CMP:  Recent Labs Lab 05/12/15 1540 05/13/15 0400  NA 145 141  K 3.7 3.2*  CL 109 102  CO2 30 30  BUN 17 14  CREATININE 0.68 0.66  CALCIUM 9.0 8.6*  PROT 6.0*  --   BILITOT 1.7*  --   ALKPHOS 67  --   ALT 27  --   AST 29  --   GLUCOSE 115* 93   BNP (last 3 results)  Recent Labs  05/12/15 1540  BNP 391.3*    ProBNP (last 3 results)  No results for input(s): PROBNP in the last 8760 hours.   Thyroid: Lab Results  Component Value Date   TSH 2.159 05/12/2015    Radiology: Right pleural effusion, increased bronchial markings  EKG: Initial EKG showed sinus bradycardia with low volume P waves.  Subsequent EKG shows atrial fibrillation  Discharge Medications:   Medication List    TAKE these medications        amiodarone 200 MG tablet  Commonly known as:  PACERONE  Take 1 tablet (200 mg total) by mouth 2 (two) times  daily.     atenolol 50 MG tablet  Commonly known as:  TENORMIN  Take 1 tablet (50 mg total) by mouth daily. Hold if pulse is less than 60     BIOTIN PO  Take 1 tablet by mouth daily.     CALCIUM + D PO  Take 1 tablet by mouth daily.     FLUoxetine 20 MG tablet  Commonly known as:  PROZAC  Take 20 mg by mouth daily.     furosemide 20 MG tablet  Commonly known as:  LASIX  Take 1 tablet (20 mg total) by mouth daily.     levothyroxine 112 MCG tablet  Commonly known as:  SYNTHROID, LEVOTHROID  Take 112 mcg by mouth daily before breakfast.     loratadine 10 MG tablet  Commonly known as:  CLARITIN  Take 10 mg by mouth daily.     multivitamin with minerals Tabs tablet  Take 1 tablet by mouth daily.     potassium chloride 10 MEQ tablet  Commonly known as:  K-DUR  Take 1 tablet (10 mEq total) by mouth 2 (two) times daily.     rosuvastatin 20 MG tablet  Commonly known as:  CRESTOR  Take 20 mg by mouth daily.     warfarin 5 MG tablet  Commonly known as:  COUMADIN  Take 5 mg by mouth daily. Take 2.5 mg on Mon / Wed / Fri  Take 5 mg on Sun / Tues / Thurs / Sat.         Followup plans and appointments: Follow-up with Dr. Wynonia Lawman in one week  Time spent with patient to include physician time:  30 minutes Signed: W. Doristine Church. MD [facility] 05/13/2015, 11:09 AM

## 2015-08-20 HISTORY — PX: ATRIAL FIBRILLATION ABLATION: EP1191

## 2016-08-19 DIAGNOSIS — G4733 Obstructive sleep apnea (adult) (pediatric): Secondary | ICD-10-CM | POA: Diagnosis not present

## 2016-08-27 DIAGNOSIS — G4733 Obstructive sleep apnea (adult) (pediatric): Secondary | ICD-10-CM | POA: Diagnosis not present

## 2016-09-19 DIAGNOSIS — G4733 Obstructive sleep apnea (adult) (pediatric): Secondary | ICD-10-CM | POA: Diagnosis not present

## 2016-10-09 DIAGNOSIS — D6851 Activated protein C resistance: Secondary | ICD-10-CM | POA: Diagnosis not present

## 2016-10-09 DIAGNOSIS — Z7901 Long term (current) use of anticoagulants: Secondary | ICD-10-CM | POA: Diagnosis not present

## 2016-10-09 DIAGNOSIS — I4891 Unspecified atrial fibrillation: Secondary | ICD-10-CM | POA: Diagnosis not present

## 2016-11-13 DIAGNOSIS — D6851 Activated protein C resistance: Secondary | ICD-10-CM | POA: Diagnosis not present

## 2016-11-13 DIAGNOSIS — I4891 Unspecified atrial fibrillation: Secondary | ICD-10-CM | POA: Diagnosis not present

## 2016-11-13 DIAGNOSIS — Z7901 Long term (current) use of anticoagulants: Secondary | ICD-10-CM | POA: Diagnosis not present

## 2016-11-25 DIAGNOSIS — G4733 Obstructive sleep apnea (adult) (pediatric): Secondary | ICD-10-CM | POA: Diagnosis not present

## 2016-11-25 DIAGNOSIS — E785 Hyperlipidemia, unspecified: Secondary | ICD-10-CM | POA: Diagnosis not present

## 2016-11-25 DIAGNOSIS — E668 Other obesity: Secondary | ICD-10-CM | POA: Diagnosis not present

## 2016-11-25 DIAGNOSIS — I10 Essential (primary) hypertension: Secondary | ICD-10-CM | POA: Diagnosis not present

## 2016-11-25 DIAGNOSIS — I481 Persistent atrial fibrillation: Secondary | ICD-10-CM | POA: Diagnosis not present

## 2016-11-25 DIAGNOSIS — Z7901 Long term (current) use of anticoagulants: Secondary | ICD-10-CM | POA: Diagnosis not present

## 2016-11-25 DIAGNOSIS — I480 Paroxysmal atrial fibrillation: Secondary | ICD-10-CM | POA: Diagnosis not present

## 2016-11-25 DIAGNOSIS — E039 Hypothyroidism, unspecified: Secondary | ICD-10-CM | POA: Diagnosis not present

## 2016-11-25 DIAGNOSIS — I119 Hypertensive heart disease without heart failure: Secondary | ICD-10-CM | POA: Diagnosis not present

## 2016-12-02 DIAGNOSIS — G4733 Obstructive sleep apnea (adult) (pediatric): Secondary | ICD-10-CM | POA: Diagnosis not present

## 2016-12-02 DIAGNOSIS — R0609 Other forms of dyspnea: Secondary | ICD-10-CM | POA: Diagnosis not present

## 2016-12-27 DIAGNOSIS — I4891 Unspecified atrial fibrillation: Secondary | ICD-10-CM | POA: Diagnosis not present

## 2016-12-27 DIAGNOSIS — D6851 Activated protein C resistance: Secondary | ICD-10-CM | POA: Diagnosis not present

## 2016-12-27 DIAGNOSIS — Z7901 Long term (current) use of anticoagulants: Secondary | ICD-10-CM | POA: Diagnosis not present

## 2016-12-27 DIAGNOSIS — I10 Essential (primary) hypertension: Secondary | ICD-10-CM | POA: Diagnosis not present

## 2016-12-27 DIAGNOSIS — H02403 Unspecified ptosis of bilateral eyelids: Secondary | ICD-10-CM | POA: Diagnosis not present

## 2016-12-27 DIAGNOSIS — G4733 Obstructive sleep apnea (adult) (pediatric): Secondary | ICD-10-CM | POA: Diagnosis not present

## 2017-01-15 DIAGNOSIS — R69 Illness, unspecified: Secondary | ICD-10-CM | POA: Diagnosis not present

## 2017-01-28 DIAGNOSIS — M549 Dorsalgia, unspecified: Secondary | ICD-10-CM | POA: Diagnosis not present

## 2017-01-28 DIAGNOSIS — E039 Hypothyroidism, unspecified: Secondary | ICD-10-CM | POA: Diagnosis not present

## 2017-01-28 DIAGNOSIS — D6851 Activated protein C resistance: Secondary | ICD-10-CM | POA: Diagnosis not present

## 2017-01-28 DIAGNOSIS — Z7901 Long term (current) use of anticoagulants: Secondary | ICD-10-CM | POA: Diagnosis not present

## 2017-01-28 DIAGNOSIS — I4891 Unspecified atrial fibrillation: Secondary | ICD-10-CM | POA: Diagnosis not present

## 2017-02-04 DIAGNOSIS — M545 Low back pain: Secondary | ICD-10-CM | POA: Diagnosis not present

## 2017-02-05 ENCOUNTER — Ambulatory Visit
Admission: RE | Admit: 2017-02-05 | Discharge: 2017-02-05 | Disposition: A | Discharge status: Home / Self Care | Payer: Medicare HMO | Source: Ambulatory Visit | Attending: Family Medicine | Admitting: Family Medicine

## 2017-02-05 ENCOUNTER — Other Ambulatory Visit: Payer: Medicare Other

## 2017-02-05 ENCOUNTER — Other Ambulatory Visit: Payer: Self-pay | Admitting: Family Medicine

## 2017-02-05 DIAGNOSIS — M545 Low back pain: Secondary | ICD-10-CM

## 2017-02-05 DIAGNOSIS — M5136 Other intervertebral disc degeneration, lumbar region: Secondary | ICD-10-CM | POA: Diagnosis not present

## 2017-02-18 DIAGNOSIS — R69 Illness, unspecified: Secondary | ICD-10-CM | POA: Diagnosis not present

## 2017-02-20 DIAGNOSIS — E785 Hyperlipidemia, unspecified: Secondary | ICD-10-CM | POA: Diagnosis not present

## 2017-02-20 DIAGNOSIS — E039 Hypothyroidism, unspecified: Secondary | ICD-10-CM | POA: Diagnosis not present

## 2017-02-20 DIAGNOSIS — E668 Other obesity: Secondary | ICD-10-CM | POA: Diagnosis not present

## 2017-02-20 DIAGNOSIS — R0609 Other forms of dyspnea: Secondary | ICD-10-CM | POA: Diagnosis not present

## 2017-02-20 DIAGNOSIS — I119 Hypertensive heart disease without heart failure: Secondary | ICD-10-CM | POA: Diagnosis not present

## 2017-02-20 DIAGNOSIS — G4733 Obstructive sleep apnea (adult) (pediatric): Secondary | ICD-10-CM | POA: Diagnosis not present

## 2017-02-20 DIAGNOSIS — I481 Persistent atrial fibrillation: Secondary | ICD-10-CM | POA: Diagnosis not present

## 2017-02-20 DIAGNOSIS — I480 Paroxysmal atrial fibrillation: Secondary | ICD-10-CM | POA: Diagnosis not present

## 2017-02-20 DIAGNOSIS — Z7901 Long term (current) use of anticoagulants: Secondary | ICD-10-CM | POA: Diagnosis not present

## 2017-02-20 DIAGNOSIS — I10 Essential (primary) hypertension: Secondary | ICD-10-CM | POA: Diagnosis not present

## 2017-03-04 DIAGNOSIS — J209 Acute bronchitis, unspecified: Secondary | ICD-10-CM | POA: Diagnosis not present

## 2017-03-04 DIAGNOSIS — R062 Wheezing: Secondary | ICD-10-CM | POA: Diagnosis not present

## 2017-03-05 DIAGNOSIS — J209 Acute bronchitis, unspecified: Secondary | ICD-10-CM | POA: Diagnosis not present

## 2017-03-12 DIAGNOSIS — J209 Acute bronchitis, unspecified: Secondary | ICD-10-CM | POA: Diagnosis not present

## 2017-06-07 DIAGNOSIS — J209 Acute bronchitis, unspecified: Secondary | ICD-10-CM | POA: Diagnosis not present

## 2017-06-18 DIAGNOSIS — J209 Acute bronchitis, unspecified: Secondary | ICD-10-CM | POA: Diagnosis not present

## 2017-06-30 DIAGNOSIS — G4733 Obstructive sleep apnea (adult) (pediatric): Secondary | ICD-10-CM | POA: Diagnosis not present

## 2017-07-02 DIAGNOSIS — J209 Acute bronchitis, unspecified: Secondary | ICD-10-CM | POA: Diagnosis not present

## 2017-07-02 DIAGNOSIS — I10 Essential (primary) hypertension: Secondary | ICD-10-CM | POA: Diagnosis not present

## 2017-07-02 DIAGNOSIS — E039 Hypothyroidism, unspecified: Secondary | ICD-10-CM | POA: Diagnosis not present

## 2017-07-02 DIAGNOSIS — Z0000 Encounter for general adult medical examination without abnormal findings: Secondary | ICD-10-CM | POA: Diagnosis not present

## 2017-07-02 DIAGNOSIS — I4891 Unspecified atrial fibrillation: Secondary | ICD-10-CM | POA: Diagnosis not present

## 2017-07-02 DIAGNOSIS — D6851 Activated protein C resistance: Secondary | ICD-10-CM | POA: Diagnosis not present

## 2017-07-02 DIAGNOSIS — E7800 Pure hypercholesterolemia, unspecified: Secondary | ICD-10-CM | POA: Diagnosis not present

## 2017-07-02 DIAGNOSIS — Z23 Encounter for immunization: Secondary | ICD-10-CM | POA: Diagnosis not present

## 2017-07-02 DIAGNOSIS — Z87891 Personal history of nicotine dependence: Secondary | ICD-10-CM | POA: Diagnosis not present

## 2017-07-02 DIAGNOSIS — M545 Low back pain: Secondary | ICD-10-CM | POA: Diagnosis not present

## 2017-07-14 DIAGNOSIS — H2513 Age-related nuclear cataract, bilateral: Secondary | ICD-10-CM | POA: Diagnosis not present

## 2017-07-14 DIAGNOSIS — H04123 Dry eye syndrome of bilateral lacrimal glands: Secondary | ICD-10-CM | POA: Diagnosis not present

## 2017-07-14 DIAGNOSIS — H524 Presbyopia: Secondary | ICD-10-CM | POA: Diagnosis not present

## 2017-07-21 DIAGNOSIS — R69 Illness, unspecified: Secondary | ICD-10-CM | POA: Diagnosis not present

## 2017-08-21 DIAGNOSIS — H2513 Age-related nuclear cataract, bilateral: Secondary | ICD-10-CM | POA: Diagnosis not present

## 2017-08-26 DIAGNOSIS — G4733 Obstructive sleep apnea (adult) (pediatric): Secondary | ICD-10-CM | POA: Diagnosis not present

## 2017-08-26 DIAGNOSIS — R0609 Other forms of dyspnea: Secondary | ICD-10-CM | POA: Diagnosis not present

## 2017-08-26 DIAGNOSIS — Z7901 Long term (current) use of anticoagulants: Secondary | ICD-10-CM | POA: Diagnosis not present

## 2017-08-26 DIAGNOSIS — I119 Hypertensive heart disease without heart failure: Secondary | ICD-10-CM | POA: Diagnosis not present

## 2017-08-26 DIAGNOSIS — E039 Hypothyroidism, unspecified: Secondary | ICD-10-CM | POA: Diagnosis not present

## 2017-08-26 DIAGNOSIS — I480 Paroxysmal atrial fibrillation: Secondary | ICD-10-CM | POA: Diagnosis not present

## 2017-08-26 DIAGNOSIS — I10 Essential (primary) hypertension: Secondary | ICD-10-CM | POA: Diagnosis not present

## 2017-08-26 DIAGNOSIS — E668 Other obesity: Secondary | ICD-10-CM | POA: Diagnosis not present

## 2017-09-03 DIAGNOSIS — H25811 Combined forms of age-related cataract, right eye: Secondary | ICD-10-CM | POA: Diagnosis not present

## 2017-09-03 DIAGNOSIS — H2511 Age-related nuclear cataract, right eye: Secondary | ICD-10-CM | POA: Diagnosis not present

## 2017-10-22 DIAGNOSIS — H25812 Combined forms of age-related cataract, left eye: Secondary | ICD-10-CM | POA: Diagnosis not present

## 2017-10-22 DIAGNOSIS — H2512 Age-related nuclear cataract, left eye: Secondary | ICD-10-CM | POA: Diagnosis not present

## 2017-11-17 DIAGNOSIS — L853 Xerosis cutis: Secondary | ICD-10-CM | POA: Diagnosis not present

## 2017-11-17 DIAGNOSIS — D1800 Hemangioma unspecified site: Secondary | ICD-10-CM | POA: Diagnosis not present

## 2017-11-17 DIAGNOSIS — D225 Melanocytic nevi of trunk: Secondary | ICD-10-CM | POA: Diagnosis not present

## 2017-11-17 DIAGNOSIS — D485 Neoplasm of uncertain behavior of skin: Secondary | ICD-10-CM | POA: Diagnosis not present

## 2017-11-17 DIAGNOSIS — L820 Inflamed seborrheic keratosis: Secondary | ICD-10-CM | POA: Diagnosis not present

## 2017-11-17 DIAGNOSIS — L821 Other seborrheic keratosis: Secondary | ICD-10-CM | POA: Diagnosis not present

## 2017-11-17 DIAGNOSIS — L720 Epidermal cyst: Secondary | ICD-10-CM | POA: Diagnosis not present

## 2017-12-02 ENCOUNTER — Telehealth: Payer: Self-pay | Admitting: *Deleted

## 2017-12-02 NOTE — Telephone Encounter (Signed)
call pt to make appt to see Rosaria Ferries

## 2017-12-18 ENCOUNTER — Ambulatory Visit: Payer: Medicare HMO | Admitting: Physician Assistant

## 2017-12-31 DIAGNOSIS — Z1231 Encounter for screening mammogram for malignant neoplasm of breast: Secondary | ICD-10-CM | POA: Diagnosis not present

## 2018-01-14 ENCOUNTER — Ambulatory Visit: Payer: Medicare HMO | Admitting: Cardiology

## 2018-01-14 ENCOUNTER — Encounter: Payer: Self-pay | Admitting: Cardiology

## 2018-01-14 VITALS — BP 150/82 | HR 48 | Ht 66.0 in | Wt 205.0 lb

## 2018-01-14 DIAGNOSIS — R0600 Dyspnea, unspecified: Secondary | ICD-10-CM

## 2018-01-14 DIAGNOSIS — R0989 Other specified symptoms and signs involving the circulatory and respiratory systems: Secondary | ICD-10-CM | POA: Diagnosis not present

## 2018-01-14 DIAGNOSIS — R0609 Other forms of dyspnea: Secondary | ICD-10-CM

## 2018-01-14 DIAGNOSIS — E782 Mixed hyperlipidemia: Secondary | ICD-10-CM | POA: Diagnosis not present

## 2018-01-14 DIAGNOSIS — R5383 Other fatigue: Secondary | ICD-10-CM | POA: Diagnosis not present

## 2018-01-14 DIAGNOSIS — Z79899 Other long term (current) drug therapy: Secondary | ICD-10-CM | POA: Diagnosis not present

## 2018-01-14 DIAGNOSIS — I4819 Other persistent atrial fibrillation: Secondary | ICD-10-CM

## 2018-01-14 DIAGNOSIS — I481 Persistent atrial fibrillation: Secondary | ICD-10-CM | POA: Diagnosis not present

## 2018-01-14 NOTE — Assessment & Plan Note (Signed)
She has been on anticoagulation since college secondary to history of Facto 5 Leiden and past PE/ DVT as well as PAF.

## 2018-01-14 NOTE — Assessment & Plan Note (Signed)
Check dopplers 

## 2018-01-14 NOTE — Assessment & Plan Note (Signed)
Antiocagulated

## 2018-01-14 NOTE — Assessment & Plan Note (Signed)
Exertional dyspnea, check echo done recently, check 7 day event, check labs

## 2018-01-14 NOTE — Progress Notes (Signed)
01/14/2018 Carly Romero   April 22, 1946  [id number]  Primary Physician Rankins, Bill Salinas, MD Primary Cardiologist: Dr Martinique to see-  EP Dr Norm Salt- [facility]  HPI:  Carly Romero 72 y/o female with a history of PAF dating back to 2015. She had been followed by Dr Wynonia Lawman as her general cardiologist but she has requested transfer of her care to our group. She had RFA in May 2016 and again in Jan 2017 by Dr Glennon Mac at [facility]. She has failed Tikosyn and has been intolerant to Amiodarone (alopecia). Since her last RFA she apparently has held NSR for some time but she tells me recently she has noticed episodic palpitations and DOE. She thinks she is going in and out of atrial fibrillation. She has no history of coronary artery disease and has never had a cath. Prior echos have shown normal LVF with diastolic dysfunction and a question of bicuspid AOV. The pt denies chest pain.    Current Outpatient Medications  Medication Sig Dispense Refill  . atenolol (TENORMIN) 50 MG tablet Take 1 tablet (50 mg total) by mouth daily. Hold if pulse is less than 60    . BIOTIN PO Take 1 tablet by mouth daily.    . Calcium Carbonate-Vitamin D (CALCIUM + D PO) Take 1 tablet by mouth daily.    Marland Kitchen ELIQUIS 5 MG TABS tablet     . FLUoxetine (PROZAC) 20 MG tablet Take 20 mg by mouth daily.    . furosemide (LASIX) 20 MG tablet Take 1 tablet (20 mg total) by mouth daily. 30 tablet 12  . levothyroxine (SYNTHROID, LEVOTHROID) 100 MCG tablet Take by mouth.    . loratadine (CLARITIN) 10 MG tablet Take 10 mg by mouth daily.    . Multiple Vitamin (MULTIVITAMIN WITH MINERALS) TABS tablet Take 1 tablet by mouth daily.    . potassium chloride (K-DUR) 10 MEQ tablet Take 1 tablet (10 mEq total) by mouth 2 (two) times daily. 60 tablet 12  . rosuvastatin (CRESTOR) 20 MG tablet Take 20 mg by mouth daily.     No current facility-administered medications for this visit.     Allergies  Allergen Reactions  . Amiodarone Other (See  Comments)    alopecia  . Chantix [Varenicline]     Suicidal thoughts  . Codeine Swelling    Swelling as a young adult  . Lipitor [Atorvastatin] Diarrhea    And cramping.    Past Medical History:  Diagnosis Date  . Atrial fibrillation ([place])   . Factor V Leiden ([place])   . Hyperlipidemia   . Hypertension   . Hypertensive heart disease   . Hypothyroidism   . Obesity (BMI 30-39.9)   . Thyroid disease     Social History   Socioeconomic History  . Marital status: Single    Spouse name: Not on file  . Number of children: Not on file  . Years of education: Not on file  . Highest education level: Not on file  Occupational History  . Not on file  Social Needs  . Financial resource strain: Not on file  . Food insecurity:    Worry: Not on file    Inability: Not on file  . Transportation needs:    Medical: Not on file    Non-medical: Not on file  Tobacco Use  . Smoking status: Former Smoker    Packs/day: 1.00    Years: 40.00    Pack years: 40.00    Last attempt to quit: 12/11/2013  Years since quitting: 4.0  . Smokeless tobacco: Never Used  Substance and Sexual Activity  . Alcohol use: Yes    Alcohol/week: 4.8 oz    Types: 8 Glasses of wine per week  . Drug use: No  . Sexual activity: Never  Lifestyle  . Physical activity:    Days per week: Not on file    Minutes per session: Not on file  . Stress: Not on file  Relationships  . Social connections:    Talks on phone: Not on file    Gets together: Not on file    Attends religious service: Not on file    Active member of club or organization: Not on file    Attends meetings of clubs or organizations: Not on file    Relationship status: Not on file  . Intimate partner violence:    Fear of current or ex partner: Not on file    Emotionally abused: Not on file    Physically abused: Not on file    Forced sexual activity: Not on file  Other Topics Concern  . Not on file  Social History Narrative  . Not on file       No family history on file.   Review of Systems: General: negative for chills, fever, night sweats or weight changes.  Cardiovascular: negative for chest pain, dyspnea on exertion, edema, orthopnea, palpitations, paroxysmal nocturnal dyspnea or shortness of breath Dermatological: negative for rash Respiratory: negative for cough or wheezing Urologic: negative for hematuria Abdominal: negative for nausea, vomiting, diarrhea, bright red blood per rectum, melena, or hematemesis Neurologic: negative for visual changes, syncope, or dizziness All other systems reviewed and are otherwise negative except as noted above.    Blood pressure (!) 150/82, pulse (!) 48, height 5\' 6"  (1.676 m), weight 205 lb (93 kg).  General appearance: alert, cooperative, no distress and mildly obese Neck: no JVD and RCA bruit Lungs: clear to auscultation bilaterally Heart: regular rate and rhythm Abdomen: soft, non-tender; bowel sounds normal; no masses,  no organomegaly Extremities: extremities normal, atraumatic, no cyanosis or edema Pulses: 2+ and symmetric Skin: Skin color, texture, turgor normal. No rashes or lesions Neurologic: Grossly normal  EKG NSR, SB-48, PACs  ASSESSMENT AND PLAN:   Persistent atrial fibrillation (HCC) Recurrent PAF since 2015.  S/P RFA May 2016 and Jan 2017 She has failed Tikosyn and Amiodarone (intolerant) She is in NSR today-HR 1  Long term (current) use of anticoagulants She has been on anticoagulation since college secondary to history of Facto 5 Leiden and past PE/ DVT as well as PAF.   Hyperlipidemia On statin Rx- check lipids /CMET  Factor V Leiden Antiocagulated  Bruit of right carotid artery Check dopplers  Dyspnea on exertion Exertional dyspnea, check echo done recently, check 7 day event, check labs   PLAN  Discussed with Dr Martinique in the office today. We will try and get her most recent echo from Dr Wynonia Lawman. We'll get labs today-CMET, TSH, CBC,  lipids. She'll need carotid dopplers and an 7 day event monitor. F/U with dr Martinique after the above  [facility] PA-C 01/14/2018 10:06 AM   Addendum: Echo reviewed from December 02 2016- Pt had normal LVF with moderate LVH and grade 3 DD. She had moderate LAE. There was no mention of a bicuspid AOV.

## 2018-01-14 NOTE — Patient Instructions (Addendum)
Medication Instructions: Your physician recommends that you continue on your current medications as directed. Please refer to the Current Medication list given to you today.  If you need a refill on your cardiac medications before your next appointment, please call your pharmacy.   Labwork: Your physician recommends that you return for lab work in: Today (CMP, CBC, Lipid, TSH)   Procedures/Testing: Your physician has recommended that you wear an event monitor for 7 days. Event monitors are medical devices that record the heart's electrical activity. Doctors most often [place] these monitors to diagnose arrhythmias. Arrhythmias are problems with the speed or rhythm of the heartbeat. The monitor is a small, portable device. You can wear one while you do your normal daily activities. This is usually used to diagnose what is causing palpitations/syncope (passing out). [name] physician has requested that you have a carotid duplex. This test is an ultrasound of the carotid arteries in your neck. It looks at blood flow through these arteries that supply the brain with blood. Allow one hour for this exam. There are no restrictions or special instructions. [facility]. [address]    Follow-Up: Your physician wants you to follow-up in 4-6 weeks with [name]   Special Instructions:    Thank you for choosing [facility] at [facility]!!

## 2018-01-14 NOTE — Assessment & Plan Note (Signed)
Recurrent PAF since 2015.  S/P RFA May 2016 and Jan 2017 She has failed Tikosyn and Amiodarone (intolerant) She is in NSR today-HR 48

## 2018-01-14 NOTE — Assessment & Plan Note (Signed)
On statin Rx- check lipids /CMET

## 2018-01-15 LAB — COMPREHENSIVE METABOLIC PANEL
ALT: 23 IU/L (ref 0–32)
AST: 23 IU/L (ref 0–40)
Albumin/Globulin Ratio: 1.5 (ref 1.2–2.2)
Albumin: 4.1 g/dL (ref 3.5–4.8)
Alkaline Phosphatase: 73 IU/L (ref 39–117)
BUN/Creatinine Ratio: 22 (ref 12–28)
BUN: 17 mg/dL (ref 8–27)
Bilirubin Total: 1 mg/dL (ref 0.0–1.2)
CO2: 28 mmol/L (ref 20–29)
Calcium: 9.4 mg/dL (ref 8.7–10.3)
Chloride: 101 mmol/L (ref 96–106)
Creatinine, Ser: 0.76 mg/dL (ref 0.57–1.00)
GFR calc Af Amer: 91 mL/min/{1.73_m2} (ref >59)
GFR calc non Af Amer: 79 mL/min/{1.73_m2} (ref >59)
Globulin, Total: 2.7 g/dL (ref 1.5–4.5)
Glucose: 87 mg/dL (ref 65–99)
Potassium: 4.8 mmol/L (ref 3.5–5.2)
Sodium: 143 mmol/L (ref 134–144)
Total Protein: 6.8 g/dL (ref 6.0–8.5)

## 2018-01-15 LAB — CBC
Hematocrit: 39.5 % (ref 34.0–46.6)
Hemoglobin: 13.1 g/dL (ref 11.1–15.9)
MCH: 29.6 pg (ref 26.6–33.0)
MCHC: 33.2 g/dL (ref 31.5–35.7)
MCV: 89 fL (ref 79–97)
Platelets: 239 10*3/uL (ref 150–450)
RBC: 4.43 x10E6/uL (ref 3.77–5.28)
RDW: 13 % (ref 12.3–15.4)
WBC: 10.3 10*3/uL (ref 3.4–10.8)

## 2018-01-15 LAB — LIPID PANEL
Chol/HDL Ratio: 2.8 ratio (ref 0.0–4.4)
Cholesterol, Total: 208 mg/dL — ABNORMAL HIGH (ref 100–199)
HDL: 73 mg/dL (ref >39)
LDL Calculated: 102 mg/dL — ABNORMAL HIGH (ref 0–99)
Triglycerides: 164 mg/dL — ABNORMAL HIGH (ref 0–149)
VLDL Cholesterol Cal: 33 mg/dL (ref 5–40)

## 2018-01-15 LAB — TSH: TSH: 1.18 u[IU]/mL (ref 0.450–4.500)

## 2018-01-16 ENCOUNTER — Other Ambulatory Visit (INDEPENDENT_AMBULATORY_CARE_PROVIDER_SITE_OTHER): Payer: Medicare HMO

## 2018-01-16 ENCOUNTER — Encounter (HOSPITAL_COMMUNITY): Payer: Medicare HMO

## 2018-01-16 DIAGNOSIS — I4819 Other persistent atrial fibrillation: Secondary | ICD-10-CM

## 2018-01-16 DIAGNOSIS — I481 Persistent atrial fibrillation: Secondary | ICD-10-CM | POA: Diagnosis not present

## 2018-01-20 ENCOUNTER — Ambulatory Visit (HOSPITAL_COMMUNITY)
Admission: RE | Admit: 2018-01-20 | Discharge: 2018-01-20 | Disposition: A | Discharge status: Home / Self Care | Payer: Medicare HMO | Source: Ambulatory Visit | Attending: Cardiovascular Disease | Admitting: Cardiovascular Disease

## 2018-01-20 DIAGNOSIS — I10 Essential (primary) hypertension: Secondary | ICD-10-CM | POA: Insufficient documentation

## 2018-01-20 DIAGNOSIS — E785 Hyperlipidemia, unspecified: Secondary | ICD-10-CM | POA: Diagnosis not present

## 2018-01-20 DIAGNOSIS — Z87891 Personal history of nicotine dependence: Secondary | ICD-10-CM | POA: Diagnosis not present

## 2018-01-20 DIAGNOSIS — R0989 Other specified symptoms and signs involving the circulatory and respiratory systems: Secondary | ICD-10-CM | POA: Insufficient documentation

## 2018-01-21 ENCOUNTER — Ambulatory Visit (INDEPENDENT_AMBULATORY_CARE_PROVIDER_SITE_OTHER): Payer: Medicare HMO

## 2018-01-21 DIAGNOSIS — I481 Persistent atrial fibrillation: Secondary | ICD-10-CM | POA: Diagnosis not present

## 2018-01-21 DIAGNOSIS — R5383 Other fatigue: Secondary | ICD-10-CM | POA: Diagnosis not present

## 2018-01-21 DIAGNOSIS — I4819 Other persistent atrial fibrillation: Secondary | ICD-10-CM

## 2018-02-16 ENCOUNTER — Ambulatory Visit (INDEPENDENT_AMBULATORY_CARE_PROVIDER_SITE_OTHER): Payer: Medicare HMO | Admitting: *Deleted

## 2018-02-16 ENCOUNTER — Telehealth: Payer: Self-pay | Admitting: Cardiology

## 2018-02-16 ENCOUNTER — Telehealth: Payer: Self-pay

## 2018-02-16 VITALS — BP 150/100 | HR 120 | Ht 66.0 in | Wt 204.2 lb

## 2018-02-16 DIAGNOSIS — I4819 Other persistent atrial fibrillation: Secondary | ICD-10-CM

## 2018-02-16 DIAGNOSIS — I481 Persistent atrial fibrillation: Secondary | ICD-10-CM | POA: Diagnosis not present

## 2018-02-16 MED ORDER — METOPROLOL TARTRATE 50 MG PO TABS: ORAL_TABLET | ORAL | 3 refills | Status: DC

## 2018-02-16 NOTE — Telephone Encounter (Signed)
Called the patient and LVM to call back and schedule ekg appt today.

## 2018-02-16 NOTE — Progress Notes (Signed)
Medication Instructions:   STOP ATENOLOL  START METOPROLOL TARTRATE 50 MG ONE TABLET TWICE DAILY  Follow-Up:  CALL OR SEND E MAIL ON Wednesday OF BLOOD PRESSURE AND PULSE READINGS  Your physician recommends that you schedule a follow-up appointment in: AS SCHEDULED

## 2018-02-16 NOTE — Patient Instructions (Signed)
Medication Instructions:   STOP ATENOLOL  START METOPROLOL TARTRATE 50 MG TWICE DAILY  Follow-Up:  CALL OR E MAIL ON Wednesday WITH BLOOD PRESSURE AND PULSE READINGS  Your physician recommends that you schedule a follow-up appointment in: AS SCHEDULED

## 2018-02-16 NOTE — Telephone Encounter (Signed)
Called patient to inform her to come to the office for EKG; when I called earlier to tell he that her monitor report showed no A FIB she stated she was currently in Afib and had been for the last 3 days. She asked if she could send over the EKG from [place] but we have no way of receiving it. Appt made on nurse schedule for patient to have EKG, triage made aware.

## 2018-02-16 NOTE — Progress Notes (Signed)
1.) Reason for visit: EKG  2.) Name of MD requesting visit: rhonda barrett pa  H&P patient has a remote history of proximal atrial fib   3.) ROS related to problem: patient was called this morning with her test results and she reported that she thought she was in atrial fib as her alive cor was telling her of abnormal rhythm. She has been having increased SOB and sweating. She reports her heart rate has been running in the low 100's. We requested she come to the office for EKG.  4.) Assessment and plan per MD: EKG shows atrial fib with RVR. Discussed with rhonda barrett pa, patient was instructed to stop atenolol. She will start lopressor 50 mg twice daily. She will contact us on Wednesday this week with blood pressure and pulse readings for medication titration purposes if needed. She has a follow up appointment with dr Martinique on 03-05-18 and will keep as scheduled for now. Patient voiced understanding to go to the ER if her symptoms get worse and her heart rate is not under control.

## 2018-02-17 ENCOUNTER — Encounter: Payer: Self-pay | Admitting: Cardiology

## 2018-02-18 ENCOUNTER — Telehealth: Payer: Self-pay | Admitting: Cardiology

## 2018-02-18 NOTE — Telephone Encounter (Signed)
I spoke with Ms Ditullio this morning. She is doing well, HR 90's in AF. I suggested she continue the Metoprolol 50 mg BID as ordered. She has no plans to go back to [facility] for her AF. I suggested we arrange an evaluation in our AF clinic and she is agreeable to this.   Kerin Ransom PA-C 02/18/2018 10:56 AM

## 2018-02-20 ENCOUNTER — Encounter (HOSPITAL_COMMUNITY): Payer: Self-pay | Admitting: Nurse Practitioner

## 2018-02-20 ENCOUNTER — Ambulatory Visit (HOSPITAL_COMMUNITY)
Admission: RE | Admit: 2018-02-20 | Discharge: 2018-02-20 | Disposition: A | Discharge status: Home / Self Care | Payer: Medicare HMO | Source: Ambulatory Visit | Attending: Nurse Practitioner | Admitting: Nurse Practitioner

## 2018-02-20 VITALS — BP 164/94 | HR 96 | Ht 66.0 in | Wt 204.0 lb

## 2018-02-20 DIAGNOSIS — D6851 Activated protein C resistance: Secondary | ICD-10-CM | POA: Diagnosis not present

## 2018-02-20 DIAGNOSIS — Z885 Allergy status to narcotic agent status: Secondary | ICD-10-CM | POA: Insufficient documentation

## 2018-02-20 DIAGNOSIS — E039 Hypothyroidism, unspecified: Secondary | ICD-10-CM | POA: Insufficient documentation

## 2018-02-20 DIAGNOSIS — Z87891 Personal history of nicotine dependence: Secondary | ICD-10-CM | POA: Diagnosis not present

## 2018-02-20 DIAGNOSIS — I481 Persistent atrial fibrillation: Secondary | ICD-10-CM | POA: Diagnosis not present

## 2018-02-20 DIAGNOSIS — Z7901 Long term (current) use of anticoagulants: Secondary | ICD-10-CM | POA: Diagnosis not present

## 2018-02-20 DIAGNOSIS — E669 Obesity, unspecified: Secondary | ICD-10-CM | POA: Diagnosis not present

## 2018-02-20 DIAGNOSIS — Z6832 Body mass index (BMI) 32.0-32.9, adult: Secondary | ICD-10-CM | POA: Diagnosis not present

## 2018-02-20 DIAGNOSIS — Z79890 Hormone replacement therapy (postmenopausal): Secondary | ICD-10-CM | POA: Insufficient documentation

## 2018-02-20 DIAGNOSIS — Z888 Allergy status to other drugs, medicaments and biological substances status: Secondary | ICD-10-CM | POA: Diagnosis not present

## 2018-02-20 DIAGNOSIS — I119 Hypertensive heart disease without heart failure: Secondary | ICD-10-CM | POA: Diagnosis not present

## 2018-02-20 DIAGNOSIS — E785 Hyperlipidemia, unspecified: Secondary | ICD-10-CM | POA: Insufficient documentation

## 2018-02-20 DIAGNOSIS — Z79899 Other long term (current) drug therapy: Secondary | ICD-10-CM | POA: Insufficient documentation

## 2018-02-20 DIAGNOSIS — I4819 Other persistent atrial fibrillation: Secondary | ICD-10-CM

## 2018-02-20 DIAGNOSIS — I4891 Unspecified atrial fibrillation: Secondary | ICD-10-CM | POA: Diagnosis present

## 2018-02-20 LAB — BASIC METABOLIC PANEL
Anion gap: 7 (ref 5–15)
BUN: 18 mg/dL (ref 8–23)
CALCIUM: 9.2 mg/dL (ref 8.9–10.3)
CO2: 29 mmol/L (ref 22–32)
CREATININE: 0.99 mg/dL (ref 0.44–1.00)
Chloride: 106 mmol/L (ref 98–111)
GFR, EST NON AFRICAN AMERICAN: 56 mL/min — AB (ref >60)
Glucose, Bld: 116 mg/dL — ABNORMAL HIGH (ref 70–99)
Potassium: 4.3 mmol/L (ref 3.5–5.1)
SODIUM: 142 mmol/L (ref 135–145)

## 2018-02-20 LAB — CBC WITH DIFFERENTIAL/PLATELET
Abs Immature Granulocytes: 0.1 10*3/uL (ref 0.0–0.1)
BASOS ABS: 0.1 10*3/uL (ref 0.0–0.1)
BASOS PCT: 1 %
EOS ABS: 0.2 10*3/uL (ref 0.0–0.7)
Eosinophils Relative: 2 %
HCT: 45.1 % (ref 36.0–46.0)
Hemoglobin: 14.7 g/dL (ref 12.0–15.0)
Immature Granulocytes: 1 %
Lymphocytes Relative: 25 %
Lymphs Abs: 2.4 10*3/uL (ref 0.7–4.0)
MCH: 29.9 pg (ref 26.0–34.0)
MCHC: 32.6 g/dL (ref 30.0–36.0)
MCV: 91.7 fL (ref 78.0–100.0)
MONOS PCT: 7 %
Monocytes Absolute: 0.7 10*3/uL (ref 0.1–1.0)
NEUTROS PCT: 64 %
Neutro Abs: 6.4 10*3/uL (ref 1.7–7.7)
PLATELETS: 234 10*3/uL (ref 150–400)
RBC: 4.92 MIL/uL (ref 3.87–5.11)
RDW: 13.3 % (ref 11.5–15.5)
WBC: 9.9 10*3/uL (ref 4.0–10.5)

## 2018-02-20 MED ORDER — METOPROLOL TARTRATE 50 MG PO TABS: ORAL_TABLET | ORAL | 3 refills | Status: DC

## 2018-02-20 NOTE — Progress Notes (Addendum)
Primary Care Physician: Aretta Nip, MD Referring Physician:  Kerin Ransom, PA to establish wit [name] Prior EP: Dr. Glennon Mac at [place] is a 72 y.o. female with a complex h/o of afib. Her EP management has been at [facility] with 2 previous ablations 5/222016 and 1/22017. She has also used amiodarone in the past with significant hair loss so this was stopped. Failed Tikosyn  to keep her in rhythm but this was tried before any ablations. She recently was seen by Kerin Ransom, PA, for [name] as she is transitioning care form Dr. Wynonia Lawman. After she was seen by Lurena Joiner, she has gone into symptomatic  persistent afib for the last 8 days. She was referred to the Afib Clinc for further evaluation. She reports missing a dose of eliquis last week. Chadsvasc score of 3. H/o of longstanding anticoagulation use due to Factor V Leiden.  Today, she denies symptoms of palpitations, chest pain, shortness of breath, orthopnea, PND, lower extremity edema, dizziness, presyncope, syncope, or neurologic sequela. t for fatigue and exertional dyspnea. The patient is tolerating medications without difficulties and is otherwise without complaint today.   Past Medical History:  Diagnosis Date  . Atrial fibrillation ([place])   . Factor V Leiden ([place])   . Hyperlipidemia   . Hypertension   . Hypertensive heart disease   . Hypothyroidism   . Obesity (BMI 30-39.9)   . Thyroid disease    Past Surgical History:  Procedure Laterality Date  . ATRIAL FIBRILLATION ABLATION  12/2014   Dr Glennon Mac at [facility]  . ATRIAL FIBRILLATION ABLATION  08/2015   Dr Glennon Mac at [facility]  . CARDIOVERSION N/A 05/26/2014   Procedure: CARDIOVERSION;  Surgeon: Jacolyn Reedy, MD;  Location: [facility] ENDOSCOPY;  Service: Cardiovascular;  Laterality: N/A;  . CARDIOVERSION N/A 09/14/2014   Procedure: CARDIOVERSION;  Surgeon: Jacolyn Reedy, MD;  Location: [facility] ENDOSCOPY;  Service: Cardiovascular;  Laterality: N/A;  . CARDIOVERSION N/A  05/11/2015   Procedure: CARDIOVERSION;  Surgeon: Jacolyn Reedy, MD;  Location: [facility];  Service: Cardiovascular;  Laterality: N/A;  . COSMETIC SURGERY     lower lids  . DENTAL SURGERY     wisdom teeth    Current Outpatient Medications  Medication Sig Dispense Refill  . Calcium Carbonate-Vitamin D (CALCIUM + D PO) Take 1 tablet by mouth daily.    Marland Kitchen ELIQUIS 5 MG TABS tablet Take 5 mg by mouth 2 (two) times daily.     Marland Kitchen FLUoxetine (PROZAC) 20 MG tablet Take 20 mg by mouth daily.    . furosemide (LASIX) 20 MG tablet Take 1 tablet (20 mg total) by mouth daily. 30 tablet 12  . levothyroxine (SYNTHROID, LEVOTHROID) 100 MCG tablet Take 100 mcg by mouth daily before breakfast.     . loratadine (CLARITIN) 10 MG tablet Take 10 mg by mouth daily as needed for allergies.     . metoprolol tartrate (LOPRESSOR) 50 MG tablet 1 and 1/2 tablet twice daily 270 tablet 3  . Multiple Vitamin (MULTIVITAMIN WITH MINERALS) TABS tablet Take 1 tablet by mouth daily.    . potassium chloride (K-DUR,KLOR-CON) 10 MEQ tablet Take 10 mEq by mouth daily.    . rosuvastatin (CRESTOR) 20 MG tablet Take 20 mg by mouth daily.    Marland Kitchen BIOTIN PO Take 1 tablet by mouth daily.    Vladimir Faster Glycol-Propyl Glycol (SYSTANE OP) Place 1 drop into both eyes daily as needed (dry eyes).     No current  facility-administered medications for this encounter.     Allergies  Allergen Reactions  . Amiodarone Other (See Comments)    alopecia  . Chantix [Varenicline]     Suicidal thoughts  . Codeine Swelling    Swelling as a young adult  . Lipitor [Atorvastatin] Diarrhea    And cramping.    Social History   Socioeconomic History  . Marital status: Single    Spouse name: Not on file  . Number of children: Not on file  . Years of education: Not on file  . Highest education level: Not on file  Occupational History  . Not on file  Social Needs  . Financial resource strain: Not on file  . Food insecurity:    Worry: Not on  file    Inability: Not on file  . Transportation needs:    Medical: Not on file    Non-medical: Not on file  Tobacco Use  . Smoking status: Former Smoker    Packs/day: 1.00    Years: 40.00    Pack years: 40.00    Last attempt to quit: 12/11/2013    Years since quitting: 4.1  . Smokeless tobacco: Never Used  Substance and Sexual Activity  . Alcohol use: Yes    Alcohol/week: 4.8 oz    Types: 8 Glasses of wine per week  . Drug use: No  . Sexual activity: Never  Lifestyle  . Physical activity:    Days per week: Not on file    Minutes per session: Not on file  . Stress: Not on file  Relationships  . Social connections:    Talks on phone: Not on file    Gets together: Not on file    Attends religious service: Not on file    Active member of club or organization: Not on file    Attends meetings of clubs or organizations: Not on file    Relationship status: Not on file  . Intimate partner violence:    Fear of current or ex partner: Not on file    Emotionally abused: Not on file    Physically abused: Not on file    Forced sexual activity: Not on file  Other Topics Concern  . Not on file  Social History Narrative  . Not on file    No family history on file.  ROS- All systems are reviewed and negative except as per the HPI above  Physical Exam: Vitals:   02/20/18 0843  BP: (!) 164/94  Pulse: 96  SpO2: 93%  Weight: 204 lb (92.5 kg)  Height: 5\' 6"  (1.676 m)   Wt Readings from Last 3 Encounters:  02/20/18 204 lb (92.5 kg)  02/16/18 204 lb 3.2 oz (92.6 kg)  01/14/18 205 lb (93 kg)    Labs: Lab Results  Component Value Date   NA 142 02/20/2018   K 4.3 02/20/2018   CL 106 02/20/2018   CO2 29 02/20/2018   GLUCOSE 116 (H) 02/20/2018   BUN 18 02/20/2018   CREATININE 0.99 02/20/2018   CALCIUM 9.2 02/20/2018   MG 2.1 09/15/2014   Lab Results  Component Value Date   INR 2.84 (H) 05/13/2015   Lab Results  Component Value Date   CHOL 208 (H) 01/14/2018   HDL 73  01/14/2018   LDLCALC 102 (H) 01/14/2018   TRIG 164 (H) 01/14/2018     GEN- The patient is well appearing, alert and oriented x 3 today.   Head- normocephalic, atraumatic Eyes-  Sclera clear,  conjunctiva pink Ears- hearing intact Oropharynx- clear Neck- supple, no JVP Lymph- no cervical lymphadenopathy Lungs- Clear to ausculation bilaterally, normal work of breathing Heart- irregular rate and rhythm, no murmurs, rubs or gallops, PMI not laterally displaced GI- soft, NT, ND, + BS Extremities- no clubbing, cyanosis, or edema MS- no significant deformity or atrophy Skin- no rash or lesion Psych- euthymic mood, full affect Neuro- strength and sensation are intact  EKG- afib at 96 bpm, qrs int 86 ms, qtc 469 ms Echo-12/02/16- per Dr. Wynonia Lawman- Showed  Moderate concentric LVH, moderate Left atrial enlargement , grade 1 aortic regurgitation, trace MR, trace TR    Assessment and Plan: 1. Persistent afib with complex afib history Failed amiodarone and reports Tikosyn failed to keep pt in rhythm but it was tried prior to any of her ablations and may work for her better now Since she has been doing well and just had afib for the last week+, will try a TEE guided cardioversion, scheduled 7/10 (missed an eliquis in the last week)  Cbc/bmet today For better rate control will increase metoprolol to 50 mg 1 1/2 tabs bid but go back to 50 mg bid, starting am of cardioversion as she runs slow in SR  If ERAF, I think her options going forward is to retry Tikosyn or discuss a 3rd ablation. Pt is in agreement She has an appointment scheduled to see [name] 7/18 at  which time rhythm can be further assessed I will be glad to see back if she is back in [place]. [place] [address] [place], [place] [address] [phone]

## 2018-02-20 NOTE — Patient Instructions (Addendum)
Your cardioversion is scheduled for : July 10th Wednesday Arrive at the [facility] and go to admitting at 11:30 a.m. Do Not eat or drink anything after midnight the night prior to your procedure. Take all your medications with a sip of water prior to arrival. Do NOT miss any doses of your blood thinner. You will NOT be able to drive home after your procedure.    Follow up with Butch Penny in one week after cardioversion  Increase Metoprolol to 1 1/2 tablet twice a day, but then DECREASE to 50 mg tablet twice a day the morning of cardioversion and from then on.

## 2018-02-22 ENCOUNTER — Encounter: Payer: Self-pay | Admitting: Cardiology

## 2018-02-23 ENCOUNTER — Other Ambulatory Visit (HOSPITAL_COMMUNITY): Payer: Self-pay | Admitting: *Deleted

## 2018-02-23 DIAGNOSIS — I4819 Other persistent atrial fibrillation: Secondary | ICD-10-CM

## 2018-02-25 ENCOUNTER — Encounter (HOSPITAL_COMMUNITY): Payer: Self-pay | Admitting: Certified Registered"

## 2018-02-25 ENCOUNTER — Inpatient Hospital Stay (HOSPITAL_COMMUNITY): Payer: Medicare HMO

## 2018-02-25 ENCOUNTER — Inpatient Hospital Stay (HOSPITAL_COMMUNITY)
Admission: AD | Admit: 2018-02-25 | Discharge: 2018-02-27 | DRG: 243 | Disposition: A | Discharge status: Home / Self Care | Payer: Medicare HMO | Source: Ambulatory Visit | Attending: Internal Medicine | Admitting: Internal Medicine

## 2018-02-25 ENCOUNTER — Other Ambulatory Visit: Payer: Self-pay

## 2018-02-25 ENCOUNTER — Encounter (HOSPITAL_COMMUNITY)
Admission: AD | Disposition: A | Discharge status: Home / Self Care | Payer: Self-pay | Source: Ambulatory Visit | Attending: Internal Medicine

## 2018-02-25 ENCOUNTER — Ambulatory Visit (HOSPITAL_COMMUNITY): Payer: Medicare HMO

## 2018-02-25 DIAGNOSIS — E785 Hyperlipidemia, unspecified: Secondary | ICD-10-CM | POA: Diagnosis present

## 2018-02-25 DIAGNOSIS — Z885 Allergy status to narcotic agent status: Secondary | ICD-10-CM

## 2018-02-25 DIAGNOSIS — Z7901 Long term (current) use of anticoagulants: Secondary | ICD-10-CM | POA: Diagnosis not present

## 2018-02-25 DIAGNOSIS — E039 Hypothyroidism, unspecified: Secondary | ICD-10-CM | POA: Diagnosis not present

## 2018-02-25 DIAGNOSIS — I480 Paroxysmal atrial fibrillation: Secondary | ICD-10-CM | POA: Diagnosis not present

## 2018-02-25 DIAGNOSIS — Z950 Presence of cardiac pacemaker: Secondary | ICD-10-CM | POA: Diagnosis not present

## 2018-02-25 DIAGNOSIS — Z79899 Other long term (current) drug therapy: Secondary | ICD-10-CM | POA: Diagnosis not present

## 2018-02-25 DIAGNOSIS — I4819 Other persistent atrial fibrillation: Secondary | ICD-10-CM | POA: Diagnosis present

## 2018-02-25 DIAGNOSIS — R0600 Dyspnea, unspecified: Secondary | ICD-10-CM | POA: Diagnosis not present

## 2018-02-25 DIAGNOSIS — I119 Hypertensive heart disease without heart failure: Secondary | ICD-10-CM | POA: Diagnosis present

## 2018-02-25 DIAGNOSIS — Z87891 Personal history of nicotine dependence: Secondary | ICD-10-CM | POA: Diagnosis not present

## 2018-02-25 DIAGNOSIS — Z888 Allergy status to other drugs, medicaments and biological substances status: Secondary | ICD-10-CM

## 2018-02-25 DIAGNOSIS — I351 Nonrheumatic aortic (valve) insufficiency: Secondary | ICD-10-CM | POA: Diagnosis not present

## 2018-02-25 DIAGNOSIS — R402413 Glasgow coma scale score 13-15, at hospital admission: Secondary | ICD-10-CM | POA: Diagnosis not present

## 2018-02-25 DIAGNOSIS — I481 Persistent atrial fibrillation: Secondary | ICD-10-CM

## 2018-02-25 DIAGNOSIS — R0602 Shortness of breath: Secondary | ICD-10-CM

## 2018-02-25 DIAGNOSIS — I160 Hypertensive urgency: Secondary | ICD-10-CM | POA: Diagnosis present

## 2018-02-25 DIAGNOSIS — Z6832 Body mass index (BMI) 32.0-32.9, adult: Secondary | ICD-10-CM

## 2018-02-25 DIAGNOSIS — E669 Obesity, unspecified: Secondary | ICD-10-CM | POA: Diagnosis not present

## 2018-02-25 DIAGNOSIS — I495 Sick sinus syndrome: Secondary | ICD-10-CM | POA: Diagnosis not present

## 2018-02-25 DIAGNOSIS — D6851 Activated protein C resistance: Secondary | ICD-10-CM | POA: Diagnosis present

## 2018-02-25 LAB — CBC WITH DIFFERENTIAL/PLATELET
Abs Immature Granulocytes: 0 10*3/uL (ref 0.0–0.1)
BASOS ABS: 0.1 10*3/uL (ref 0.0–0.1)
BASOS PCT: 1 %
EOS ABS: 0.2 10*3/uL (ref 0.0–0.7)
EOS PCT: 1 %
HCT: 41.8 % (ref 36.0–46.0)
HEMOGLOBIN: 13.3 g/dL (ref 12.0–15.0)
Immature Granulocytes: 0 %
LYMPHS ABS: 2.4 10*3/uL (ref 0.7–4.0)
LYMPHS PCT: 21 %
MCH: 29.7 pg (ref 26.0–34.0)
MCHC: 31.8 g/dL (ref 30.0–36.0)
MCV: 93.3 fL (ref 78.0–100.0)
Monocytes Absolute: 1.1 10*3/uL — ABNORMAL HIGH (ref 0.1–1.0)
Monocytes Relative: 10 %
Neutro Abs: 7.6 10*3/uL (ref 1.7–7.7)
Neutrophils Relative %: 67 %
PLATELETS: 183 10*3/uL (ref 150–400)
RBC: 4.48 MIL/uL (ref 3.87–5.11)
RDW: 13.3 % (ref 11.5–15.5)
WBC: 11.3 10*3/uL — AB (ref 4.0–10.5)

## 2018-02-25 LAB — COMPREHENSIVE METABOLIC PANEL
ALBUMIN: 3.6 g/dL (ref 3.5–5.0)
ALK PHOS: 69 U/L (ref 38–126)
ALT: 31 U/L (ref 0–44)
ANION GAP: 8 (ref 5–15)
AST: 34 U/L (ref 15–41)
BUN: 21 mg/dL (ref 8–23)
CALCIUM: 9 mg/dL (ref 8.9–10.3)
CO2: 29 mmol/L (ref 22–32)
Chloride: 106 mmol/L (ref 98–111)
Creatinine, Ser: 0.91 mg/dL (ref 0.44–1.00)
GFR calc Af Amer: >60 mL/min (ref >60)
GFR calc non Af Amer: >60 mL/min (ref >60)
GLUCOSE: 106 mg/dL — AB (ref 70–99)
Potassium: 4 mmol/L (ref 3.5–5.1)
Sodium: 143 mmol/L (ref 135–145)
Total Bilirubin: 1.9 mg/dL — ABNORMAL HIGH (ref 0.3–1.2)
Total Protein: 6.5 g/dL (ref 6.5–8.1)

## 2018-02-25 LAB — BRAIN NATRIURETIC PEPTIDE: B Natriuretic Peptide: 854.2 pg/mL — ABNORMAL HIGH (ref 0.0–100.0)

## 2018-02-25 LAB — MRSA PCR SCREENING: MRSA by PCR: NEGATIVE

## 2018-02-25 LAB — PROTIME-INR
INR: 1.37
PROTHROMBIN TIME: 16.7 s — AB (ref 11.4–15.2)

## 2018-02-25 LAB — ECHOCARDIOGRAM COMPLETE: Height: 66 in

## 2018-02-25 LAB — MAGNESIUM: Magnesium: 2 mg/dL (ref 1.7–2.4)

## 2018-02-25 LAB — TSH: TSH: 2.501 u[IU]/mL (ref 0.350–4.500)

## 2018-02-25 SURGERY — CANCELLED PROCEDURE

## 2018-02-25 MED ORDER — APIXABAN 5 MG PO TABS
5.0000 mg | ORAL_TABLET | Freq: Two times a day (BID) | ORAL | Status: DC
  Administered 2018-02-25: 5 mg via ORAL
  Filled 2018-02-25: qty 1

## 2018-02-25 MED ORDER — NITROGLYCERIN IN D5W 200-5 MCG/ML-% IV SOLN
0.0000 ug/min | INTRAVENOUS | Status: DC
  Administered 2018-02-25: 5 ug/min via INTRAVENOUS
  Administered 2018-02-26: 40 ug/min via INTRAVENOUS
  Filled 2018-02-25: qty 250

## 2018-02-25 MED ORDER — ONDANSETRON HCL 4 MG/2ML IJ SOLN: 4.0000 mg | Freq: Four times a day (QID) | INTRAMUSCULAR | Status: DC | PRN

## 2018-02-25 MED ORDER — CLONIDINE HCL 0.1 MG PO TABS
0.1000 mg | ORAL_TABLET | Freq: Three times a day (TID) | ORAL | Status: DC
  Administered 2018-02-25 – 2018-02-27 (×5): 0.1 mg via ORAL
  Filled 2018-02-25 (×5): qty 1

## 2018-02-25 MED ORDER — LOSARTAN POTASSIUM 25 MG PO TABS
25.0000 mg | ORAL_TABLET | Freq: Every day | ORAL | Status: DC
  Administered 2018-02-25 – 2018-02-26 (×2): 25 mg via ORAL
  Filled 2018-02-25 (×2): qty 1

## 2018-02-25 MED ORDER — FUROSEMIDE 20 MG PO TABS
20.0000 mg | ORAL_TABLET | Freq: Every day | ORAL | Status: DC
  Administered 2018-02-26 – 2018-02-27 (×2): 20 mg via ORAL
  Filled 2018-02-25 (×2): qty 1

## 2018-02-25 MED ORDER — ACETAMINOPHEN 325 MG PO TABS
650.0000 mg | ORAL_TABLET | ORAL | Status: DC | PRN
  Administered 2018-02-25: 650 mg via ORAL
  Filled 2018-02-25: qty 2

## 2018-02-25 MED ORDER — LEVOTHYROXINE SODIUM 100 MCG PO TABS
100.0000 ug | ORAL_TABLET | Freq: Every day | ORAL | Status: DC
  Administered 2018-02-27: 100 ug via ORAL
  Filled 2018-02-25: qty 1

## 2018-02-25 MED ORDER — FUROSEMIDE 10 MG/ML IJ SOLN
40.0000 mg | Freq: Once | INTRAMUSCULAR | Status: AC
  Administered 2018-02-25: 40 mg via INTRAVENOUS
  Filled 2018-02-25: qty 4

## 2018-02-25 MED ORDER — ROSUVASTATIN CALCIUM 20 MG PO TABS
20.0000 mg | ORAL_TABLET | Freq: Every day | ORAL | Status: DC
  Administered 2018-02-26 – 2018-02-27 (×2): 20 mg via ORAL
  Filled 2018-02-25 (×2): qty 1

## 2018-02-25 MED ORDER — FLUOXETINE HCL 20 MG PO CAPS
20.0000 mg | ORAL_CAPSULE | Freq: Every day | ORAL | Status: DC
  Administered 2018-02-26 – 2018-02-27 (×2): 20 mg via ORAL
  Filled 2018-02-25 (×2): qty 1

## 2018-02-25 MED ORDER — POTASSIUM CHLORIDE CRYS ER 10 MEQ PO TBCR
10.0000 meq | EXTENDED_RELEASE_TABLET | Freq: Every day | ORAL | Status: DC
  Administered 2018-02-26 – 2018-02-27 (×2): 10 meq via ORAL
  Filled 2018-02-25 (×2): qty 1

## 2018-02-25 MED ORDER — METOPROLOL TARTRATE 25 MG PO TABS
25.0000 mg | ORAL_TABLET | Freq: Two times a day (BID) | ORAL | Status: DC
  Administered 2018-02-25 – 2018-02-27 (×4): 25 mg via ORAL
  Filled 2018-02-25 (×4): qty 1

## 2018-02-25 NOTE — Progress Notes (Signed)
Pt arrived to Endo for a scheduled TEE/Cardioversion on 02/25/18. Upon arrival it was noted that the patient was in NSR, EKG confirmed, Dr. Radford Pax made aware. Pt was complaining of SOB and her b/p was 268/78. After Dr. Radford Pax assessed the patient it was it was decided that her appointment today would be cancelled and that the patient would be admitted for blood pressure management. Pt admitted to 2C05, report given to Anda Kraft, [place].

## 2018-02-25 NOTE — Progress Notes (Signed)
Patient reports that she does have an advanced directive and is DNR. Copy requested, MD notified.

## 2018-02-25 NOTE — Progress Notes (Signed)
Pt hr 38-43, asymptomatic.  133/60 NTG being weaned down, now at 20 mcg.  Dr. Elson Areas paged and notified of findings.  No orders given at this time.  Will continue to monitor pt and wean NTG as tolerated and ordered.

## 2018-02-25 NOTE — H&P (Addendum)
Admit date: 02/25/2018 Referring Physician: Roderic Palau, NP Primary Cardiologist:  Peter Martinique, MD Chief complaint/reason for admission: bradycardia and hypertensive urgency  HPI: Carly Romero is a 72 y.o. female who is being seen today for the evaluation of atrial fibrillation, tachybrady syndrome and hypertension.    This is a 72yo female with a history of Factor V Leiden on chronic anticoagulation, HTN, Hyperlipidemia and paroxysmal atrial fibrillation.  She has had 2 afib ablations at [facility] (2016 & 2017) by Dr. Glennon Mac and multiple cardioversions.  She is intolerant to Amio due to significant hair loss and failed Tikosyn although this was prior to her ablations. She recently was seen by Kerin Ransom, PA, for [name] as she is transitioning care form Dr. Wynonia Lawman. After she was seen by Lurena Joiner, she called stating that she had been in symptomatic persistent afib for the last 8 days.  Her Atenolol was changed to Metoprolol tartrate 50mg  BID due to a reoccurrence of afib noted on heart monitor.    She was referred to Roderic Palau, NP in afib clinic and was still in afib.  Her lopressor was increased to 75mg  BID for better rate control and she was set up for TEE/DCCV today with instructions to decrease lopressor back to 50mg  BID on the am of her procedure as she tends to run brady in sinus.  On arrival in endoscopy she was noted to be back in sinus but with bradycardia with HR 43bpm at rest and as high as 51bpm with moving.  She says that she is still very SOB and fatigued despite being back in sinus.  Her BP is markedly elevated at 268/75mmHg which was rechecked in both arms and manually.  She is not on any other BP meds and took Lopressor 50mg  this am.  Of note, she was set up for TEE along with her DCCV because she had recently missed a dose of her DOAC.   PMH:    Past Medical History:  Diagnosis Date  . Atrial fibrillation ([place])   . Factor V Leiden ([place])   . Hyperlipidemia   . Hypertension     . Hypertensive heart disease   . Hypothyroidism   . Obesity (BMI 30-39.9)   . Thyroid disease     PSH:    Past Surgical History:  Procedure Laterality Date  . ATRIAL FIBRILLATION ABLATION  12/2014   Dr Glennon Mac at [facility]  . ATRIAL FIBRILLATION ABLATION  08/2015   Dr Glennon Mac at [facility]  . CARDIOVERSION N/A 05/26/2014   Procedure: CARDIOVERSION;  Surgeon: Jacolyn Reedy, MD;  Location: [facility] ENDOSCOPY;  Service: Cardiovascular;  Laterality: N/A;  . CARDIOVERSION N/A 09/14/2014   Procedure: CARDIOVERSION;  Surgeon: Jacolyn Reedy, MD;  Location: [facility] ENDOSCOPY;  Service: Cardiovascular;  Laterality: N/A;  . CARDIOVERSION N/A 05/11/2015   Procedure: CARDIOVERSION;  Surgeon: Jacolyn Reedy, MD;  Location: [place];  Service: Cardiovascular;  Laterality: N/A;  . COSMETIC SURGERY     lower lids  . DENTAL SURGERY     wisdom teeth    ALLERGIES:   Amiodarone; Chantix [varenicline]; Codeine; and Lipitor [atorvastatin]  Prior to Admit Meds:   Medications Prior to Admission  Medication Sig Dispense Refill Last Dose  . BIOTIN PO Take 1 tablet by mouth daily.   02/24/2018 at Unknown time  . Calcium Carbonate-Vitamin D (CALCIUM + D PO) Take 1 tablet by mouth daily.   02/24/2018 at Unknown time  . ELIQUIS 5 MG TABS tablet Take 5 mg by  mouth 2 (two) times daily.    02/25/2018 at Unknown time  . FLUoxetine (PROZAC) 20 MG tablet Take 20 mg by mouth daily.   02/25/2018 at Unknown time  . furosemide (LASIX) 20 MG tablet Take 1 tablet (20 mg total) by mouth daily. 30 tablet 12 02/25/2018 at Unknown time  . levothyroxine (SYNTHROID, LEVOTHROID) 100 MCG tablet Take 100 mcg by mouth daily before breakfast.    02/25/2018 at Unknown time  . loratadine (CLARITIN) 10 MG tablet Take 10 mg by mouth daily as needed for allergies.    02/24/2018 at Unknown time  . metoprolol tartrate (LOPRESSOR) 50 MG tablet 1 and 1/2 tablet twice daily 270 tablet 3 02/25/2018 at Unknown time  . Multiple Vitamin (MULTIVITAMIN WITH MINERALS) TABS  tablet Take 1 tablet by mouth daily.   02/24/2018 at Unknown time  . Polyethyl Glycol-Propyl Glycol (SYSTANE OP) Place 1 drop into both eyes daily as needed (dry eyes).   02/24/2018 at Unknown time  . potassium chloride (K-DUR,KLOR-CON) 10 MEQ tablet Take 10 mEq by mouth daily.   02/25/2018 at Unknown time  . rosuvastatin (CRESTOR) 20 MG tablet Take 20 mg by mouth daily.   02/25/2018 at Unknown time   Family HX:  There is no family history of CAD Social HX:    Social History   Socioeconomic History  . Marital status: Single    Spouse name: Not on file  . Number of children: Not on file  . Years of education: Not on file  . Highest education level: Not on file  Occupational History  . Not on file  Social Needs  . Financial resource strain: Not on file  . Food insecurity:    Worry: Not on file    Inability: Not on file  . Transportation needs:    Medical: Not on file    Non-medical: Not on file  Tobacco Use  . Smoking status: Former Smoker    Packs/day: 1.00    Years: 40.00    Pack years: 40.00    Last attempt to quit: 12/11/2013    Years since quitting: 4.2  . Smokeless tobacco: Never Used  Substance and Sexual Activity  . Alcohol use: Yes    Alcohol/week: 4.8 oz    Types: 8 Glasses of wine per week  . Drug use: No  . Sexual activity: Never  Lifestyle  . Physical activity:    Days per week: Not on file    Minutes per session: Not on file  . Stress: Not on file  Relationships  . Social connections:    Talks on phone: Not on file    Gets together: Not on file    Attends religious service: Not on file    Active member of club or organization: Not on file    Attends meetings of clubs or organizations: Not on file    Relationship status: Not on file  . Intimate partner violence:    Fear of current or ex partner: Not on file    Emotionally abused: Not on file    Physically abused: Not on file    Forced sexual activity: Not on file  Other Topics Concern  . Not on file    Social History Narrative  . Not on file     ROS:  All ROS were addressed and are negative except what is stated in the HPI  PHYSICAL EXAM Vitals:   02/25/18 1149  BP: (!) 268/78  Resp: 20  Temp: 98.6 F (37  C)  SpO2: 96%   General: Well developed, well nourished, in no acute distress Head: Eyes PERRLA, No xanthomas.   Normal cephalic and atramatic  Lungs:   Clear bilaterally to auscultation and percussion. Heart:   HRRR S1 S2 Pulses are 2+ & equal.            No carotid bruit. No JVD.  No abdominal bruits. No femoral bruits. Abdomen: Bowel sounds are positive, abdomen soft and non-tender without masses or                  Hernia's noted. Msk:  Back normal, normal gait. Normal strength and tone for age. Extremities:   No clubbing, cyanosis or edema.  DP +1 Neuro: Alert and oriented X 3. Psych:  Good affect, responds appropriately   Labs:   Lab Results  Component Value Date   WBC 9.9 02/20/2018   HGB 14.7 02/20/2018   HCT 45.1 02/20/2018   MCV 91.7 02/20/2018   PLT 234 02/20/2018    Recent Labs  Lab 02/20/18 0957  NA 142  K 4.3  CL 106  CO2 29  BUN 18  CREATININE 0.99  CALCIUM 9.2  GLUCOSE 116*   No results found for: CKTOTAL, CKMB, CKMBINDEX, TROPONINI No results found for: PTT Lab Results  Component Value Date   INR 2.84 (H) 05/13/2015   INR 3.06 (H) 05/12/2015   INR 2.41 (H) 09/15/2014     Lab Results  Component Value Date   CHOL 208 (H) 01/14/2018   Lab Results  Component Value Date   HDL 73 01/14/2018   Lab Results  Component Value Date   LDLCALC 102 (H) 01/14/2018   Lab Results  Component Value Date   TRIG 164 (H) 01/14/2018   Lab Results  Component Value Date   CHOLHDL 2.8 01/14/2018   No results found for: LDLDIRECT    Radiology:  No results found.   Telemetry    Sinus bradycardia - Personally Reviewed  ECG    Sinus bradycardia with HR 43bpm - Personally Reviewed   ASSESSMENT/PLAN:   1.  Paroxysmal atrial  fibrillation - She has had 2 afib ablations at [facility] (2016 & 2017) by Dr. Glennon Mac and multiple cardioversions.  She is intolerant to Amio due to significant hair loss and failed Tikosyn although this was prior to her ablations.  Developed recurrent afib last week and lopressor increased to 75mg  BID.  Now back in sinus bradycardia at 43bpm after taking Lopressor 50mg  this am.  -TEE/DCCV cancelled as she is in sinus -continue Apixaban 5mg  BID for CHADS2VASC score of 3 -decrease lopressor to 25mg  BID due to marked symptomatic bradycardia -may consider retrial of Tikosyn since she has not been tried on this since her afib ablation. -will admit to tele bed under EP for further evaluation -repeat 2D echo to assess LA size.  If LA markedly dilated then may have trouble holding NSR without AAD. -check TSH  2.  Tachybrady syndrome -she is symptomatic with her afib with RVR but on higher doses of BB she has marked sinus bradycardia that is symptomatic. -May need to consider PPM to treat bradyarrhythmias and allow for higher doses of BB to control her afib. -could consider re challenge of Tikosyn and if she has recurrent afib then PPM with higher dose of BB.  3.  Hypertensive urgency -BP has been running high at home but today is 268/79mmHg and this was rechecked in both arms and manually.   -She has normal  renal function so will start Losartan 25mg  daily. -continue BB but decreasing dose to 25mg  BID due to bradycardia -check BMET in am -start IV NTG gtt for now for acute BP control -could consider changing lopressor to Carvedilol for better BP control or add Amlodipine.  4.  Factor 5 Leiden def -on chronic anticoagulation with Apixaban   Fransico Him, MD  02/25/2018  12:49 PM

## 2018-02-25 NOTE — Progress Notes (Signed)
  Echocardiogram 2D Echocardiogram has been performed.  Bobbye Charleston 02/25/2018, 4:24 PM

## 2018-02-26 ENCOUNTER — Encounter (HOSPITAL_COMMUNITY)
Admission: AD | Disposition: A | Discharge status: Home / Self Care | Payer: Self-pay | Source: Ambulatory Visit | Attending: Internal Medicine

## 2018-02-26 ENCOUNTER — Encounter (HOSPITAL_COMMUNITY): Payer: Self-pay | Admitting: Internal Medicine

## 2018-02-26 DIAGNOSIS — I495 Sick sinus syndrome: Secondary | ICD-10-CM

## 2018-02-26 HISTORY — PX: PACEMAKER IMPLANT: EP1218

## 2018-02-26 SURGERY — PACEMAKER IMPLANT
Anesthesia: LOCAL

## 2018-02-26 MED ORDER — SODIUM CHLORIDE 0.9 % IV SOLN
Freq: Once | INTRAVENOUS | Status: AC
  Administered 2018-02-26: 10 mL/h via INTRAVENOUS

## 2018-02-26 MED ORDER — CEFAZOLIN SODIUM-DEXTROSE 2-4 GM/100ML-% IV SOLN
INTRAVENOUS | Status: AC
  Filled 2018-02-26: qty 100

## 2018-02-26 MED ORDER — SODIUM CHLORIDE 0.9 % IV SOLN
INTRAVENOUS | Status: DC
  Administered 2018-02-26: 10:00:00 via INTRAVENOUS

## 2018-02-26 MED ORDER — LIDOCAINE HCL (PF) 1 % IJ SOLN
INTRAMUSCULAR | Status: AC
  Filled 2018-02-26: qty 30

## 2018-02-26 MED ORDER — CEFAZOLIN SODIUM-DEXTROSE 2-4 GM/100ML-% IV SOLN
2.0000 g | INTRAVENOUS | Status: AC
  Administered 2018-02-26: 2 g via INTRAVENOUS

## 2018-02-26 MED ORDER — LIDOCAINE HCL (PF) 1 % IJ SOLN
INTRAMUSCULAR | Status: AC
  Filled 2018-02-26: qty 60

## 2018-02-26 MED ORDER — CHLORHEXIDINE GLUCONATE 4 % EX LIQD: 60.0000 mL | Freq: Once | CUTANEOUS | Status: AC

## 2018-02-26 MED ORDER — MIDAZOLAM HCL 5 MG/5ML IJ SOLN
INTRAMUSCULAR | Status: AC
  Filled 2018-02-26: qty 5

## 2018-02-26 MED ORDER — HEPARIN (PORCINE) IN NACL 2-0.9 UNITS/ML
INTRAMUSCULAR | Status: AC | PRN
  Administered 2018-02-26: 500 mL

## 2018-02-26 MED ORDER — ONDANSETRON HCL 4 MG/2ML IJ SOLN: 4.0000 mg | Freq: Four times a day (QID) | INTRAMUSCULAR | Status: DC | PRN

## 2018-02-26 MED ORDER — LIDOCAINE HCL (PF) 1 % IJ SOLN
INTRAMUSCULAR | Status: DC | PRN
  Administered 2018-02-26: 50 mL

## 2018-02-26 MED ORDER — FENTANYL CITRATE (PF) 100 MCG/2ML IJ SOLN
INTRAMUSCULAR | Status: DC | PRN
  Administered 2018-02-26 (×2): 12.5 ug via INTRAVENOUS
  Administered 2018-02-26: 25 ug via INTRAVENOUS

## 2018-02-26 MED ORDER — SODIUM CHLORIDE 0.9 % IV SOLN
INTRAVENOUS | Status: AC
  Filled 2018-02-26: qty 2

## 2018-02-26 MED ORDER — CHLORHEXIDINE GLUCONATE 4 % EX LIQD
60.0000 mL | Freq: Once | CUTANEOUS | Status: AC
  Administered 2018-02-26: 4 via TOPICAL

## 2018-02-26 MED ORDER — HEPARIN (PORCINE) IN NACL 1000-0.9 UT/500ML-% IV SOLN
INTRAVENOUS | Status: AC
  Filled 2018-02-26: qty 500

## 2018-02-26 MED ORDER — ACETAMINOPHEN 325 MG PO TABS
325.0000 mg | ORAL_TABLET | ORAL | Status: DC | PRN
  Administered 2018-02-26: 650 mg via ORAL
  Filled 2018-02-26: qty 2

## 2018-02-26 MED ORDER — SODIUM CHLORIDE 0.9 % IV SOLN
80.0000 mg | INTRAVENOUS | Status: AC
  Administered 2018-02-26: 80 mg

## 2018-02-26 MED ORDER — FENTANYL CITRATE (PF) 100 MCG/2ML IJ SOLN
INTRAMUSCULAR | Status: AC
  Filled 2018-02-26: qty 2

## 2018-02-26 MED ORDER — LIDOCAINE HCL 1 % IJ SOLN
INTRAMUSCULAR | Status: AC
  Filled 2018-02-26: qty 20

## 2018-02-26 MED ORDER — CEFAZOLIN SODIUM-DEXTROSE 1-4 GM/50ML-% IV SOLN
1.0000 g | Freq: Four times a day (QID) | INTRAVENOUS | Status: AC
  Administered 2018-02-26 – 2018-02-27 (×3): 1 g via INTRAVENOUS
  Filled 2018-02-26 (×3): qty 50

## 2018-02-26 MED ORDER — MIDAZOLAM HCL 5 MG/5ML IJ SOLN
INTRAMUSCULAR | Status: DC | PRN
  Administered 2018-02-26 (×4): 1 mg via INTRAVENOUS

## 2018-02-26 SURGICAL SUPPLY — 7 items
CABLE SURGICAL S-101-97-12 (CABLE) ×2 IMPLANT
LEAD TENDRIL MRI 46CM LPA1200M (Lead) ×1 IMPLANT
LEAD TENDRIL MRI 52CM LPA1200M (Lead) ×1 IMPLANT
PACEMAKER ASSURITY DR-RF (Pacemaker) ×1 IMPLANT
PAD DEFIB LIFELINK (PAD) ×2 IMPLANT
SHEATH CLASSIC 8F (SHEATH) ×2 IMPLANT
TRAY PACEMAKER INSERTION (PACKS) ×2 IMPLANT

## 2018-02-26 NOTE — Discharge Instructions (Signed)
° ° °  Supplemental Discharge Instructions for  Pacemaker/Defibrillator Patients  Activity No heavy lifting or vigorous activity with your left/right arm for 6 to 8 weeks.  Do not raise your left/right arm above your head for one week.  Gradually raise your affected arm as drawn below.           __     03/02/18                   03/03/18                    03/04/18                   03/05/18  NO DRIVING for  1 week   ; you may begin driving on   1/61/09  .  WOUND CARE - Keep the wound area clean and dry.  Do not get this area wet for one week. No showers for one week; you may shower on   03/05/18  . - The tape/steri-strips on your wound will fall off; do not pull them off.  No bandage is needed on the site.  DO  NOT apply any creams, oils, or ointments to the wound area. - If you notice any drainage or discharge from the wound, any swelling or bruising at the site, or you develop a fever > 101? F after you are discharged home, call the office at once.  Special Instructions - You are still able to use cellular telephones; use the ear opposite the side where you have your pacemaker/defibrillator.  Avoid carrying your cellular phone near your device. - When traveling through airports, show security personnel your identification card to avoid being screened in the metal detectors.  Ask the security personnel to use the hand wand. - Avoid arc welding equipment, MRI testing (magnetic resonance imaging), TENS units (transcutaneous nerve stimulators).  Call the office for questions about other devices. - Avoid electrical appliances that are in poor condition or are not properly grounded. - Microwave ovens are safe to be near or to operate.

## 2018-02-26 NOTE — Discharge Summary (Addendum)
ELECTROPHYSIOLOGY PROCEDURE DISCHARGE SUMMARY    Patient ID: Carly Romero,  MRN: [id number], DOB/AGE: 1945-10-09 72 y.o.  Admit date: 02/25/2018 Discharge date: 02/27/2018  Primary Care Physician: Aretta Nip, MD Primary Cardiologist: [place] Electrophysiologist: Lovena Le  Primary Discharge Diagnosis:  Symptomatic bradycardia status post pacemaker implantation this admission  Secondary Discharge Diagnosis:  1.  Paroxysmal atrial fibrillation with RVR (2 prior PVI's at [facility], failed Tikosyn, intolerant of amiodarone) 2.  HTN 3.  Factor V Leiden 4.  Hypothyroidism   Allergies  Allergen Reactions  . Amiodarone Other (See Comments)    alopecia  . Chantix [Varenicline]     Suicidal thoughts  . Codeine Swelling    Swelling as a young adult  . Lipitor [Atorvastatin] Diarrhea    And cramping.     Procedures This Admission:  1.  Implantation of a STJ dual chamber PPM on 02/26/18 by Dr Lovena Le.  See op note for full details. There were no immediate post procedure complications. 2.  CXR on 02/27/18 demonstrated no pneumothorax status post device implantation.   Brief HPI: Carly Romero is a 72 y.o. female with the above past medical history. She was diagnosed with atrial fibrillation several years ago and intially was placed on Tikosyn for rhythm control. This was ineffective and she was switched to amiodarone but had significant hair loss and did not tolerate.  She then underwent PVI at [facility] in 2016 and again in 2017.  She has done relatively well since then with improved control of atrial fibrillation.  Lately, she has had increasing episodes of AF that are symptomatic with increased exercise intolerance and fatigue. She was seen in the AF clinic and set up for TEE guided cardioversion. Because of significant bradycardia in the past on rate control, her Metoprolol was decreased in anticipation of DCCV.  On arrival to endo, she was markedly hypertensive and bradycardic in the  40's with shortness of breath, fatigue, and exercise intolerance. She was placed on IV NTG and admitted for further observation.  She remains bradycardic this morning with continued symptoms of fatigue with exertion. She was seen by Dr Lovena Le who recommended pacemaker implant for tachy/brady syndrome.  Risks, benefits, and alternatives to PPM implantation were reviewed with the patient who wished to proceed. The patient underwent implantation of a STJ dual chamber pacemaker with details as outlined above.  She  was monitored on telemetry overnight which demonstrated atrial pacing with intrinsic ventricular conduction.  Left chest was without hematoma or ecchymosis.  The device was interrogated and found to be functioning normally.  CXR was obtained and demonstrated no pneumothorax status post device implantation.  Wound care, arm mobility, and restrictions were reviewed with the patient.  The patient was examined and considered stable for discharge to home.    Physical Exam: Vitals:   02/26/18 1944 02/26/18 2052 02/27/18 0309 02/27/18 0311  BP: (!) 155/76  (!) 159/81   Pulse: (!) 59 85 (!) 58   Resp: 18  18   Temp: 98.6 F (37 C)  97.6 F (36.4 C)   TempSrc: Oral  Oral   SpO2: 92%  93%   Weight:    202 lb 11.2 oz (91.9 kg)  Height:        GEN- The patient is well appearing, alert and oriented x 3 today.   HEENT: normocephalic, atraumatic; sclera clear, conjunctiva pink; hearing intact; oropharynx clear; neck supple  Lungs- Clear to ausculation bilaterally, normal work of breathing.  No wheezes, rales, rhonchi  Heart- Regular rate and rhythm, no murmurs, rubs or gallops  GI- soft, non-tender, non-distended, bowel sounds present  Extremities- no clubbing, cyanosis, or edema; DP/PT/radial pulses 2+ bilaterally MS- no significant deformity or atrophy Skin- warm and dry, no rash or lesion, left chest without hematoma/ecchymosis Psych- euthymic mood, full affect Neuro- strength and sensation are  intact   Labs:   Lab Results  Component Value Date   WBC 11.3 (H) 02/25/2018   HGB 13.3 02/25/2018   HCT 41.8 02/25/2018   MCV 93.3 02/25/2018   PLT 183 02/25/2018    Recent Labs  Lab 02/25/18 1401  NA 143  K 4.0  CL 106  CO2 29  BUN 21  CREATININE 0.91  CALCIUM 9.0  PROT 6.5  BILITOT 1.9*  ALKPHOS 69  ALT 31  AST 34  GLUCOSE 106*    Discharge Medications:  Allergies as of 02/27/2018      Reactions   Amiodarone Other (See Comments)   alopecia   Chantix [varenicline]    Suicidal thoughts   Codeine Swelling   Swelling as a young adult   Lipitor [atorvastatin] Diarrhea   And cramping.      Medication List    TAKE these medications   BIOTIN PO Take 1 tablet by mouth daily.   CALCIUM + D PO Take 1 tablet by mouth daily.   cloNIDine 0.1 MG tablet Commonly known as:  CATAPRES Take 1 tablet (0.1 mg total) by mouth 3 (three) times daily.   ELIQUIS 5 MG Tabs tablet Generic drug:  apixaban Take 1 tablet (5 mg total) by mouth 2 (two) times daily. Resume 03/02/18 am What changed:  additional instructions   FLUoxetine 20 MG tablet Commonly known as:  PROZAC Take 20 mg by mouth daily.   furosemide 20 MG tablet Commonly known as:  LASIX Take 1 tablet (20 mg total) by mouth daily.   levothyroxine 100 MCG tablet Commonly known as:  SYNTHROID, LEVOTHROID Take 100 mcg by mouth daily before breakfast.   loratadine 10 MG tablet Commonly known as:  CLARITIN Take 10 mg by mouth daily as needed for allergies.   losartan 50 MG tablet Commonly known as:  COZAAR Take 1 tablet (50 mg total) by mouth daily.   metoprolol tartrate 50 MG tablet Commonly known as:  LOPRESSOR 1 and 1/2 tablet twice daily What changed:    how much to take  how to take this  when to take this  additional instructions   multivitamin with minerals Tabs tablet Take 1 tablet by mouth daily.   potassium chloride 10 MEQ tablet Commonly known as:  K-DUR,KLOR-CON Take 10 mEq by  mouth daily.   rosuvastatin 20 MG tablet Commonly known as:  CRESTOR Take 20 mg by mouth daily.   SYSTANE OP Place 1 drop into both eyes daily as needed (dry eyes).       Disposition:   Follow-up Information    [place], Peter M, MD Follow up on 05/07/2018.   Specialty:  Cardiology Why:  at 11:40AM Contact information: [address] [place] [address] [phone]        [place] Office Follow up on 03/03/2018.   Specialty:  Cardiology Why:  at [facility] information: [address] [place]       Evans Lance, MD Follow up on 05/29/2018.   Specialty:  Cardiology Why:  at 11:30AM Contact information: [address]  [place] [address] [phone]           Duration of Discharge Encounter: Greater than 30 minutes including physician time.  Signed, Chanetta Marshall, NP 02/27/2018 8:12 AM  EP Attending  Patient seen and examined. Agree with above. The patient is doing well, s/p PPM insertion. She will undergo usual device followup.   Mikle Bosworth.D.

## 2018-02-26 NOTE — Progress Notes (Signed)
Pt being seen by nurse.  Pt will receive AD form once by to unit. Nurse will page Chaplain when pt is ready. Will follow as needed.  Jaclynn Major, [place], [facility], Pager [phone]

## 2018-02-26 NOTE — H&P (View-Only) (Signed)
ELECTROPHYSIOLOGY CONSULT NOTE    Patient ID: Carly Romero MRN: [id number], DOB/AGE: 1946/02/22 72 y.o.  Admit date: 02/25/2018 Date of Consult: 02/26/2018  Primary Physician: Aretta Nip, MD Primary Cardiologist: [place] Electrophysiologist: Lovena Le (new this admission)  Patient Profile: Carly Romero is a 72 y.o. female with a history of Factor V leiden, hypertension, hyperlipidemia and persistent atrial fibrillation who is being seen today for the evaluation of AF and sinus bradycardia at the request of Dr Radford Pax.  HPI:  Carly Romero is a 72 y.o. female with the above past medical history. She was diagnosed with atrial fibrillation several years ago and intially was placed on Tikosyn for rhythm control. This was ineffective and she was switched to amiodarone but had significant hair loss and did not tolerate.  She then underwent PVI at [facility] in 2016 and again in 2017.  She has done relatively well since then with improved control of atrial fibrillation.  Lately, she has had increasing episodes of AF that are symptomatic with increased exercise intolerance and fatigue. She was seen in the AF clinic and set up for TEE guided cardioversion. Because of significant bradycardia in the past on rate control, her Metoprolol was decreased in anticipation of DCCV.  On arrival to endo, she was markedly hypertensive and bradycardic in the 40's with shortness of breath, fatigue, and exercise intolerance. She was placed on IV NTG and admitted for further observation.  She remains bradycardic this morning with continued symptoms of fatigue with exertion.  EP has been asked to evaluate for treatment options.    She denies chest pain, PND, orthopnea, nausea, vomiting, dizziness, syncope, edema, weight gain, or early satiety.  Past Medical History:  Diagnosis Date  . Atrial fibrillation ([place])   . Factor V Leiden ([place])   . Hyperlipidemia   . Hypertension   . Hypertensive heart disease   .  Hypothyroidism   . Obesity (BMI 30-39.9)   . Thyroid disease      Surgical History:  Past Surgical History:  Procedure Laterality Date  . ATRIAL FIBRILLATION ABLATION  12/2014   Dr Glennon Mac at [facility]  . ATRIAL FIBRILLATION ABLATION  08/2015   Dr Glennon Mac at [facility]  . CARDIOVERSION N/A 05/26/2014   Procedure: CARDIOVERSION;  Surgeon: Jacolyn Reedy, MD;  Location: [facility] ENDOSCOPY;  Service: Cardiovascular;  Laterality: N/A;  . CARDIOVERSION N/A 09/14/2014   Procedure: CARDIOVERSION;  Surgeon: Jacolyn Reedy, MD;  Location: [facility] ENDOSCOPY;  Service: Cardiovascular;  Laterality: N/A;  . CARDIOVERSION N/A 05/11/2015   Procedure: CARDIOVERSION;  Surgeon: Jacolyn Reedy, MD;  Location: [place];  Service: Cardiovascular;  Laterality: N/A;  . COSMETIC SURGERY     lower lids  . DENTAL SURGERY     wisdom teeth     Medications Prior to Admission  Medication Sig Dispense Refill Last Dose  . BIOTIN PO Take 1 tablet by mouth daily.   02/24/2018 at Unknown time  . Calcium Carbonate-Vitamin D (CALCIUM + D PO) Take 1 tablet by mouth daily.   02/24/2018 at Unknown time  . ELIQUIS 5 MG TABS tablet Take 5 mg by mouth 2 (two) times daily.    02/25/2018 at Unknown time  . FLUoxetine (PROZAC) 20 MG tablet Take 20 mg by mouth daily.   02/25/2018 at Unknown time  . furosemide (LASIX) 20 MG tablet Take 1 tablet (20 mg total) by mouth daily. 30 tablet 12 02/25/2018 at Unknown time  . levothyroxine (SYNTHROID, LEVOTHROID) 100 MCG tablet Take 100 mcg by  mouth daily before breakfast.    02/25/2018 at Unknown time  . loratadine (CLARITIN) 10 MG tablet Take 10 mg by mouth daily as needed for allergies.    02/24/2018 at Unknown time  . metoprolol tartrate (LOPRESSOR) 50 MG tablet 1 and 1/2 tablet twice daily (Patient taking differently: Take 50 mg by mouth 2 (two) times daily. ) 270 tablet 3 02/25/2018 at Unknown time  . Multiple Vitamin (MULTIVITAMIN WITH MINERALS) TABS tablet Take 1 tablet by mouth daily.   02/24/2018 at Unknown  time  . Polyethyl Glycol-Propyl Glycol (SYSTANE OP) Place 1 drop into both eyes daily as needed (dry eyes).   02/24/2018 at Unknown time  . potassium chloride (K-DUR,KLOR-CON) 10 MEQ tablet Take 10 mEq by mouth daily.   02/25/2018 at Unknown time  . rosuvastatin (CRESTOR) 20 MG tablet Take 20 mg by mouth daily.   02/25/2018 at Unknown time    Inpatient Medications:  . cloNIDine  0.1 mg Oral TID  . FLUoxetine  20 mg Oral Daily  . furosemide  20 mg Oral Daily  . levothyroxine  100 mcg Oral QAC breakfast  . losartan  25 mg Oral Daily  . metoprolol tartrate  25 mg Oral BID  . potassium chloride  10 mEq Oral Daily  . rosuvastatin  20 mg Oral Daily    Allergies:  Allergies  Allergen Reactions  . Amiodarone Other (See Comments)    alopecia  . Chantix [Varenicline]     Suicidal thoughts  . Codeine Swelling    Swelling as a young adult  . Lipitor [Atorvastatin] Diarrhea    And cramping.    Social History   Socioeconomic History  . Marital status: Single    Spouse name: Not on file  . Number of children: Not on file  . Years of education: Not on file  . Highest education level: Not on file  Occupational History  . Not on file  Social Needs  . Financial resource strain: Not on file  . Food insecurity:    Worry: Not on file    Inability: Not on file  . Transportation needs:    Medical: Not on file    Non-medical: Not on file  Tobacco Use  . Smoking status: Former Smoker    Packs/day: 1.00    Years: 40.00    Pack years: 40.00    Last attempt to quit: 12/11/2013    Years since quitting: 4.2  . Smokeless tobacco: Never Used  Substance and Sexual Activity  . Alcohol use: Yes    Alcohol/week: 4.8 oz    Types: 8 Glasses of wine per week  . Drug use: No  . Sexual activity: Never  Lifestyle  . Physical activity:    Days per week: Not on file    Minutes per session: Not on file  . Stress: Not on file  Relationships  . Social connections:    Talks on phone: Not on file     Gets together: Not on file    Attends religious service: Not on file    Active member of club or organization: Not on file    Attends meetings of clubs or organizations: Not on file    Relationship status: Not on file  . Intimate partner violence:    Fear of current or ex partner: Not on file    Emotionally abused: Not on file    Physically abused: Not on file    Forced sexual activity: Not on file  Other Topics  Concern  . Not on file  Social History Narrative  . Not on file     Family History: no premature CAD  Review of Systems: All other systems reviewed and are otherwise negative except as noted above.  Physical Exam: Vitals:   02/26/18 0400 02/26/18 0500 02/26/18 0600 02/26/18 0745  BP: (!) 142/58 (!) 138/59 (!) 151/98 (!) 190/63  Pulse:    (!) 46  Resp: 15 18 20  (!) 21  Temp:    98.3 F (36.8 C)  TempSrc:    Oral  SpO2: 94% 93% 95% 96%  Weight:      Height:        GEN- The patient is elderly appearing, alert and oriented x 3 today.   HEENT: normocephalic, atraumatic; sclera clear, conjunctiva pink; hearing intact; oropharynx clear; neck supple Lungs- Clear to ausculation bilaterally, normal work of breathing.  No wheezes, rales, rhonchi Heart- bradycardic regular rate and rhythm  GI- soft, non-tender, non-distended, bowel sounds present Extremities- no clubbing, cyanosis, or edema  MS- no significant deformity or atrophy Skin- warm and dry, no rash or lesion Psych- euthymic mood, full affect Neuro- strength and sensation are intact  Labs:   Lab Results  Component Value Date   WBC 11.3 (H) 02/25/2018   HGB 13.3 02/25/2018   HCT 41.8 02/25/2018   MCV 93.3 02/25/2018   PLT 183 02/25/2018    Recent Labs  Lab 02/25/18 1401  NA 143  K 4.0  CL 106  CO2 29  BUN 21  CREATININE 0.91  CALCIUM 9.0  PROT 6.5  BILITOT 1.9*  ALKPHOS 69  ALT 31  AST 34  GLUCOSE 106*      Radiology/Studies: Dg Chest Port 1 View  Result Date: 02/25/2018 CLINICAL DATA:   Dyspnea EXAM: PORTABLE CHEST 1 VIEW COMPARISON:  05/13/2015 chest radiograph. FINDINGS: Stable cardiomediastinal silhouette with mild cardiomegaly. No pneumothorax. No pleural effusion. No pulmonary edema. Minimal bibasilar scarring versus atelectasis. No acute consolidative airspace disease. IMPRESSION: 1. Stable cardiomegaly without pulmonary edema. 2. Minimal bibasilar scarring versus atelectasis. Electronically Signed   By: Ilona Sorrel M.D.   On: 02/25/2018 16:23    UJW:JXBJY brady, rate 43 (personally reviewed)  TELEMETRY: sinus brady (personally reviewed)  Assessment/Plan: 1.  Symptomatic sinus bradycardia/persistent atrial fibrillation The patient has symptomatic sinus bradycardia that limits our treatment options for atrial fibrillation. She has undergone 2 prior PVI's at [facility]: [facility] and has previously failed Tikosyn and been intolerant of amiodarone. We discussed treatment options today. I think best option at this point is to proceed with pacemaker implantation which will allow for uptitration of AVN blocking agents and potentially allow [place] to use Sotalol in the future. Another option would be AVN ablation if she fails repeat attempts at AAD therapy.   Continue Eliquis for CHADS2VASC of at least 3 - will hold this am in anticipation of PPM implant Risks, benefits reviewed with patient who wishes to proceed  2.  HTN Will wean NTG to off today Uptitrate BB after PPM implant    Signed, Chanetta Marshall, NP  02/26/2018 8:39 AM  EP Attending  Patient seen and examined. Agree with above. The patient has been admitted with atrial fib and RVR/symptomatic bradycardia due to sinus node dysfunction despite 2 afib ablations. In addition she has uncontrolled HTN. I have discussed the indications/risks/benefits/goals/expectations of PPM insertion and she wishes to proceed.  Mikle Bosworth.D.

## 2018-02-26 NOTE — Consult Note (Addendum)
ELECTROPHYSIOLOGY CONSULT NOTE    Patient ID: Carly Romero MRN: [id number], DOB/AGE: 71/25/1947 71 y.o.  Admit date: 02/25/2018 Date of Consult: 02/26/2018  Primary Physician: Aretta Nip, MD Primary Cardiologist: [place] Electrophysiologist: Lovena Le (new this admission)  Patient Profile: Carly Romero is a 72 y.o. female with a history of Factor V leiden, hypertension, hyperlipidemia and persistent atrial fibrillation who is being seen today for the evaluation of AF and sinus bradycardia at the request of Dr Radford Pax.  HPI:  Carly Romero is a 72 y.o. female with the above past medical history. She was diagnosed with atrial fibrillation several years ago and intially was placed on Tikosyn for rhythm control. This was ineffective and she was switched to amiodarone but had significant hair loss and did not tolerate.  She then underwent PVI at [facility] in 2016 and again in 2017.  She has done relatively well since then with improved control of atrial fibrillation.  Lately, she has had increasing episodes of AF that are symptomatic with increased exercise intolerance and fatigue. She was seen in the AF clinic and set up for TEE guided cardioversion. Because of significant bradycardia in the past on rate control, her Metoprolol was decreased in anticipation of DCCV.  On arrival to endo, she was markedly hypertensive and bradycardic in the 40's with shortness of breath, fatigue, and exercise intolerance. She was placed on IV NTG and admitted for further observation.  She remains bradycardic this morning with continued symptoms of fatigue with exertion.  EP has been asked to evaluate for treatment options.    She denies chest pain, PND, orthopnea, nausea, vomiting, dizziness, syncope, edema, weight gain, or early satiety.  Past Medical History:  Diagnosis Date  . Atrial fibrillation ([place])   . Factor V Leiden ([place])   . Hyperlipidemia   . Hypertension   . Hypertensive heart disease   .  Hypothyroidism   . Obesity (BMI 30-39.9)   . Thyroid disease      Surgical History:  Past Surgical History:  Procedure Laterality Date  . ATRIAL FIBRILLATION ABLATION  12/2014   Dr Glennon Mac at [facility]  . ATRIAL FIBRILLATION ABLATION  08/2015   Dr Glennon Mac at [facility]  . CARDIOVERSION N/A 05/26/2014   Procedure: CARDIOVERSION;  Surgeon: Jacolyn Reedy, MD;  Location: [facility] ENDOSCOPY;  Service: Cardiovascular;  Laterality: N/A;  . CARDIOVERSION N/A 09/14/2014   Procedure: CARDIOVERSION;  Surgeon: Jacolyn Reedy, MD;  Location: [facility] ENDOSCOPY;  Service: Cardiovascular;  Laterality: N/A;  . CARDIOVERSION N/A 05/11/2015   Procedure: CARDIOVERSION;  Surgeon: Jacolyn Reedy, MD;  Location: [place];  Service: Cardiovascular;  Laterality: N/A;  . COSMETIC SURGERY     lower lids  . DENTAL SURGERY     wisdom teeth     Medications Prior to Admission  Medication Sig Dispense Refill Last Dose  . BIOTIN PO Take 1 tablet by mouth daily.   02/24/2018 at Unknown time  . Calcium Carbonate-Vitamin D (CALCIUM + D PO) Take 1 tablet by mouth daily.   02/24/2018 at Unknown time  . ELIQUIS 5 MG TABS tablet Take 5 mg by mouth 2 (two) times daily.    02/25/2018 at Unknown time  . FLUoxetine (PROZAC) 20 MG tablet Take 20 mg by mouth daily.   02/25/2018 at Unknown time  . furosemide (LASIX) 20 MG tablet Take 1 tablet (20 mg total) by mouth daily. 30 tablet 12 02/25/2018 at Unknown time  . levothyroxine (SYNTHROID, LEVOTHROID) 100 MCG tablet Take 100 mcg by  mouth daily before breakfast.    02/25/2018 at Unknown time  . loratadine (CLARITIN) 10 MG tablet Take 10 mg by mouth daily as needed for allergies.    02/24/2018 at Unknown time  . metoprolol tartrate (LOPRESSOR) 50 MG tablet 1 and 1/2 tablet twice daily (Patient taking differently: Take 50 mg by mouth 2 (two) times daily. ) 270 tablet 3 02/25/2018 at Unknown time  . Multiple Vitamin (MULTIVITAMIN WITH MINERALS) TABS tablet Take 1 tablet by mouth daily.   02/24/2018 at Unknown  time  . Polyethyl Glycol-Propyl Glycol (SYSTANE OP) Place 1 drop into both eyes daily as needed (dry eyes).   02/24/2018 at Unknown time  . potassium chloride (K-DUR,KLOR-CON) 10 MEQ tablet Take 10 mEq by mouth daily.   02/25/2018 at Unknown time  . rosuvastatin (CRESTOR) 20 MG tablet Take 20 mg by mouth daily.   02/25/2018 at Unknown time    Inpatient Medications:  . cloNIDine  0.1 mg Oral TID  . FLUoxetine  20 mg Oral Daily  . furosemide  20 mg Oral Daily  . levothyroxine  100 mcg Oral QAC breakfast  . losartan  25 mg Oral Daily  . metoprolol tartrate  25 mg Oral BID  . potassium chloride  10 mEq Oral Daily  . rosuvastatin  20 mg Oral Daily    Allergies:  Allergies  Allergen Reactions  . Amiodarone Other (See Comments)    alopecia  . Chantix [Varenicline]     Suicidal thoughts  . Codeine Swelling    Swelling as a young adult  . Lipitor [Atorvastatin] Diarrhea    And cramping.    Social History   Socioeconomic History  . Marital status: Single    Spouse name: Not on file  . Number of children: Not on file  . Years of education: Not on file  . Highest education level: Not on file  Occupational History  . Not on file  Social Needs  . Financial resource strain: Not on file  . Food insecurity:    Worry: Not on file    Inability: Not on file  . Transportation needs:    Medical: Not on file    Non-medical: Not on file  Tobacco Use  . Smoking status: Former Smoker    Packs/day: 1.00    Years: 40.00    Pack years: 40.00    Last attempt to quit: 12/11/2013    Years since quitting: 4.2  . Smokeless tobacco: Never Used  Substance and Sexual Activity  . Alcohol use: Yes    Alcohol/week: 4.8 oz    Types: 8 Glasses of wine per week  . Drug use: No  . Sexual activity: Never  Lifestyle  . Physical activity:    Days per week: Not on file    Minutes per session: Not on file  . Stress: Not on file  Relationships  . Social connections:    Talks on phone: Not on file     Gets together: Not on file    Attends religious service: Not on file    Active member of club or organization: Not on file    Attends meetings of clubs or organizations: Not on file    Relationship status: Not on file  . Intimate partner violence:    Fear of current or ex partner: Not on file    Emotionally abused: Not on file    Physically abused: Not on file    Forced sexual activity: Not on file  Other Topics  Concern  . Not on file  Social History Narrative  . Not on file     Family History: no premature CAD  Review of Systems: All other systems reviewed and are otherwise negative except as noted above.  Physical Exam: Vitals:   02/26/18 0400 02/26/18 0500 02/26/18 0600 02/26/18 0745  BP: (!) 142/58 (!) 138/59 (!) 151/98 (!) 190/63  Pulse:    (!) 46  Resp: 15 18 20  (!) 21  Temp:    98.3 F (36.8 C)  TempSrc:    Oral  SpO2: 94% 93% 95% 96%  Weight:      Height:        GEN- The patient is elderly appearing, alert and oriented x 3 today.   HEENT: normocephalic, atraumatic; sclera clear, conjunctiva pink; hearing intact; oropharynx clear; neck supple Lungs- Clear to ausculation bilaterally, normal work of breathing.  No wheezes, rales, rhonchi Heart- bradycardic regular rate and rhythm  GI- soft, non-tender, non-distended, bowel sounds present Extremities- no clubbing, cyanosis, or edema  MS- no significant deformity or atrophy Skin- warm and dry, no rash or lesion Psych- euthymic mood, full affect Neuro- strength and sensation are intact  Labs:   Lab Results  Component Value Date   WBC 11.3 (H) 02/25/2018   HGB 13.3 02/25/2018   HCT 41.8 02/25/2018   MCV 93.3 02/25/2018   PLT 183 02/25/2018    Recent Labs  Lab 02/25/18 1401  NA 143  K 4.0  CL 106  CO2 29  BUN 21  CREATININE 0.91  CALCIUM 9.0  PROT 6.5  BILITOT 1.9*  ALKPHOS 69  ALT 31  AST 34  GLUCOSE 106*      Radiology/Studies: Dg Chest Port 1 View  Result Date: 02/25/2018 CLINICAL DATA:   Dyspnea EXAM: PORTABLE CHEST 1 VIEW COMPARISON:  05/13/2015 chest radiograph. FINDINGS: Stable cardiomediastinal silhouette with mild cardiomegaly. No pneumothorax. No pleural effusion. No pulmonary edema. Minimal bibasilar scarring versus atelectasis. No acute consolidative airspace disease. IMPRESSION: 1. Stable cardiomegaly without pulmonary edema. 2. Minimal bibasilar scarring versus atelectasis. Electronically Signed   By: Ilona Sorrel M.D.   On: 02/25/2018 16:23    EQA:STMHD brady, rate 43 (personally reviewed)  TELEMETRY: sinus brady (personally reviewed)  Assessment/Plan: 1.  Symptomatic sinus bradycardia/persistent atrial fibrillation The patient has symptomatic sinus bradycardia that limits our treatment options for atrial fibrillation. She has undergone 2 prior PVI's at [facility] and has previously failed Tikosyn and been intolerant of amiodarone. We discussed treatment options today. I think best option at this point is to proceed with pacemaker implantation which will allow for uptitration of AVN blocking agents and potentially allow [place] to use Sotalol in the future. Another option would be AVN ablation if she fails repeat attempts at AAD therapy.   Continue Eliquis for CHADS2VASC of at least 3 - will hold this am in anticipation of PPM implant Risks, benefits reviewed with patient who wishes to proceed  2.  HTN Will wean NTG to off today Uptitrate BB after PPM implant    Signed, Chanetta Marshall, NP  02/26/2018 8:39 AM  EP Attending  Patient seen and examined. Agree with above. The patient has been admitted with atrial fib and RVR/symptomatic bradycardia due to sinus node dysfunction despite 2 afib ablations. In addition she has uncontrolled HTN. I have discussed the indications/risks/benefits/goals/expectations of PPM insertion and she wishes to proceed.  Mikle Bosworth.D.

## 2018-02-26 NOTE — Interval H&P Note (Signed)
History and Physical Interval Note:  02/26/2018 10:16 AM  Carly Romero  has presented today for surgery, with the diagnosis of bradycardia  The various methods of treatment have been discussed with the patient and family. After consideration of risks, benefits and other options for treatment, the patient has consented to  Procedure(s): PACEMAKER IMPLANT (N/A) as a surgical intervention .  The patient's history has been reviewed, patient examined, no change in status, stable for surgery.  I have reviewed the patient's chart and labs.  Questions were answered to the patient's satisfaction.     Carly Romero

## 2018-02-27 ENCOUNTER — Inpatient Hospital Stay (HOSPITAL_COMMUNITY): Payer: Medicare HMO

## 2018-02-27 MED ORDER — CLONIDINE HCL 0.1 MG PO TABS: 0.1000 mg | ORAL_TABLET | Freq: Three times a day (TID) | ORAL | 11 refills | Status: DC

## 2018-02-27 MED ORDER — LOSARTAN POTASSIUM 50 MG PO TABS: 50.0000 mg | ORAL_TABLET | Freq: Every day | ORAL | 1 refills | Status: DC

## 2018-02-27 MED ORDER — LOSARTAN POTASSIUM 50 MG PO TABS
50.0000 mg | ORAL_TABLET | Freq: Every day | ORAL | Status: DC
  Administered 2018-02-27: 50 mg via ORAL
  Filled 2018-02-27: qty 1

## 2018-02-27 MED ORDER — ELIQUIS 5 MG PO TABS: 5.0000 mg | ORAL_TABLET | Freq: Two times a day (BID) | ORAL | Status: DC

## 2018-02-27 NOTE — Progress Notes (Signed)
Discharge to home.Pt alert and oriented, no complaints of any pain or discomfort. Discharge instructions, activity limitations  and follow up appointments discussed with patient.Verbalized understanding.

## 2018-02-27 NOTE — Consult Note (Signed)
[facility] CM Primary Care Navigator  02/27/2018  Carly Romero 1945-10-25 [id number]   Met withpatient at the bedsideto identify possible discharge needs. Patientreports having increasing episodes of atrial fibrillation that are symptomatic with increased exercise intolerance and fatigue. Patient  added that she was markedly hypertensive and bradycardic which all led to this admission/ surgery. (pacemaker implant for tachy/ brady syndrome).  Patientendorses Dr.Victoria Rankins with [facility] as herprimary care provider.   Patient verbalized using[facility] [place] to obtain medications without difficulty.   Patient reportsmanagingherownmedications at [facility] of "pill box" system filled every 6 weeks.  Patient statesthatshe drives prior to admission,but her friend/ neighbor Anderson [place]) can providetransportation to herdoctors' appointmentsif needed after discharge.  Patient verbalizedthat she lives alone, independent and is the primary caregiver for herself. She reports that friends/ neighbors are willing to help her out if needed after discharge.  Anticipateddischarge planishomeaccording to patient.  Patientvoiced understanding to call primary care provider's officewhen shereturnshomefor a post discharge follow-up within1- 2weeksor sooner if needs arise.Patient letter (with PCP's contact number) was provided asareminder.   Discussed with patientregarding [facility] CM services available for health management at home butshe indicated managing herself well and "does not really feel needing help so far". Shedenies current needs or concernsat this point.  [place] calls to follow-uprecoveryas well.   She was encouragedto seekreferral from primary care provider to [facility] care management if deemednecessaryand appropriate for any services in the future.  [facility]  care management information was provided for future needs thatshe may have.    For additional questions please contact:  Edwena Felty A. Wanna Gully, BSN, RN-BC [facility] PRIMARY CARE Navigator Cell: [phone]

## 2018-03-03 ENCOUNTER — Ambulatory Visit (INDEPENDENT_AMBULATORY_CARE_PROVIDER_SITE_OTHER): Payer: Medicare HMO | Admitting: *Deleted

## 2018-03-03 VITALS — BP 138/68 | HR 69

## 2018-03-03 DIAGNOSIS — I4819 Other persistent atrial fibrillation: Secondary | ICD-10-CM

## 2018-03-03 DIAGNOSIS — I481 Persistent atrial fibrillation: Secondary | ICD-10-CM | POA: Diagnosis not present

## 2018-03-03 NOTE — Progress Notes (Signed)
Incision site assessed s/p resumption of Eliquis 03/02/2018. No hematoma note, no bruising at all noted, steri-strips dry and intact. Pt stated that she was SOB but this was not new and that she had no pain upon inspiration or expiration. SOB only when active, quick check of PPM pt in SR Ap/Vs, no episodes of AF lead measurement WNL pt O2 95% and BP 138/68. ROV 03/10/18 for removal of steri-strips and device check. Pt educated to use Biotene mouth wash for dry mouth and she may resume an exercise regime of walking or stationary bike that the only restriction she has until 6 weeks s/p PPM placement is no lifting over 10 pound with left arm. Pt voiced understanding.

## 2018-03-04 ENCOUNTER — Ambulatory Visit (HOSPITAL_COMMUNITY): Payer: Medicare HMO | Admitting: Nurse Practitioner

## 2018-03-05 ENCOUNTER — Ambulatory Visit: Payer: Medicare HMO | Admitting: Cardiology

## 2018-03-05 ENCOUNTER — Ambulatory Visit: Payer: Medicare HMO

## 2018-03-10 ENCOUNTER — Ambulatory Visit (INDEPENDENT_AMBULATORY_CARE_PROVIDER_SITE_OTHER): Payer: Medicare HMO | Admitting: *Deleted

## 2018-03-10 DIAGNOSIS — I495 Sick sinus syndrome: Secondary | ICD-10-CM | POA: Diagnosis not present

## 2018-03-10 LAB — CUP PACEART INCLINIC DEVICE CHECK
Battery Remaining Longevity: 130 mo
Implantable Lead Implant Date: 20190711
Implantable Lead Location: 753859
Implantable Pulse Generator Implant Date: 20190711
Lead Channel Impedance Value: 587.5 Ohm
Lead Channel Impedance Value: 587.5 Ohm
Lead Channel Pacing Threshold Amplitude: 0.75 V
Lead Channel Pacing Threshold Amplitude: 0.75 V
Lead Channel Pacing Threshold Amplitude: 0.75 V
Lead Channel Pacing Threshold Pulse Width: 0.4 ms
Lead Channel Pacing Threshold Pulse Width: 0.4 ms
Lead Channel Pacing Threshold Pulse Width: 0.5 ms
Lead Channel Sensing Intrinsic Amplitude: 4.1 mV
Lead Channel Setting Pacing Amplitude: 0.875
Lead Channel Setting Pacing Pulse Width: 0.4 ms
Lead Channel Setting Sensing Sensitivity: 2 mV
MDC IDC LEAD IMPLANT DT: 20190711
MDC IDC LEAD LOCATION: 753860
MDC IDC MSMT BATTERY VOLTAGE: 3.11 V
MDC IDC MSMT LEADCHNL RA PACING THRESHOLD AMPLITUDE: 0.75 V
MDC IDC MSMT LEADCHNL RA PACING THRESHOLD PULSEWIDTH: 0.5 ms
MDC IDC MSMT LEADCHNL RV SENSING INTR AMPL: 12 mV
MDC IDC PG SERIAL: 9041042
MDC IDC SESS DTM: 20190723154839
MDC IDC SET LEADCHNL RA PACING AMPLITUDE: 1.625
MDC IDC STAT BRADY RA PERCENT PACED: 96 %
MDC IDC STAT BRADY RV PERCENT PACED: 1.1 %

## 2018-03-10 NOTE — Progress Notes (Signed)
Wound check appointment. Steri-strips removed. Wound without redness or edema. Incision edges approximated, wound well healed. Normal device function. Thresholds, sensing, and impedances consistent with implant measurements. Device programmed at auto capture programmed on for extra safety margin until 3 month visit. Histogram distribution appropriate for patient and level of activity. No mode switches or high ventricular rates noted. Patient educated about wound care, arm mobility, lifting restrictions. ROV 05/29/2018 w/ GT

## 2018-03-11 DIAGNOSIS — I10 Essential (primary) hypertension: Secondary | ICD-10-CM | POA: Diagnosis not present

## 2018-03-11 DIAGNOSIS — E039 Hypothyroidism, unspecified: Secondary | ICD-10-CM | POA: Diagnosis not present

## 2018-03-11 DIAGNOSIS — I480 Paroxysmal atrial fibrillation: Secondary | ICD-10-CM | POA: Diagnosis not present

## 2018-03-11 DIAGNOSIS — G4730 Sleep apnea, unspecified: Secondary | ICD-10-CM | POA: Diagnosis not present

## 2018-03-11 DIAGNOSIS — D6851 Activated protein C resistance: Secondary | ICD-10-CM | POA: Diagnosis not present

## 2018-03-11 DIAGNOSIS — Z7901 Long term (current) use of anticoagulants: Secondary | ICD-10-CM | POA: Diagnosis not present

## 2018-03-11 DIAGNOSIS — Z950 Presence of cardiac pacemaker: Secondary | ICD-10-CM | POA: Diagnosis not present

## 2018-03-11 DIAGNOSIS — E7800 Pure hypercholesterolemia, unspecified: Secondary | ICD-10-CM | POA: Diagnosis not present

## 2018-03-11 DIAGNOSIS — I495 Sick sinus syndrome: Secondary | ICD-10-CM | POA: Diagnosis not present

## 2018-03-17 ENCOUNTER — Ambulatory Visit: Payer: Medicare HMO | Admitting: Internal Medicine

## 2018-03-17 ENCOUNTER — Encounter: Payer: Self-pay | Admitting: Internal Medicine

## 2018-03-17 VITALS — BP 124/66 | HR 60 | Ht 66.0 in | Wt 202.0 lb

## 2018-03-17 DIAGNOSIS — I495 Sick sinus syndrome: Secondary | ICD-10-CM

## 2018-03-17 DIAGNOSIS — R0602 Shortness of breath: Secondary | ICD-10-CM | POA: Diagnosis not present

## 2018-03-17 DIAGNOSIS — Z950 Presence of cardiac pacemaker: Secondary | ICD-10-CM

## 2018-03-17 DIAGNOSIS — R5383 Other fatigue: Secondary | ICD-10-CM | POA: Diagnosis not present

## 2018-03-17 NOTE — Patient Instructions (Addendum)
Medication Instructions:  Your physician has recommended you make the following change in your medication: 1.  Stop taking clonidine 2.  Increase your lasix 20 mg tablets-  Take 3 tablets by mouth daily for 3 days.  Then decrease to 2 tablets by mouth daily after that.    Labwork: You will need blood work on March 30, 2018-You can come in any time between 8:00 am and 5:00 pm for CBC, BMP and TSH.  Testing/Procedures: None ordered.  Follow-Up:  Your physician wants you to follow-up in: 3 months with Dr. Lovena Le.    June 10, 2018 at 2:00 pm.  Please arrive 15 minutes early.    Any Other Special Instructions Will Be Listed Below (If Applicable).  If you need a refill on your cardiac medications before your next appointment, please call your pharmacy.

## 2018-03-17 NOTE — Progress Notes (Signed)
HPI Mrs. Braid returns today having been referred back for an early evaluation by Dr. Radene Ou. She is a 72 yo woman with PAF, s/p 2 ablations, recurrent atrial arrhythmias, and pauses, s/p PPM. The patient c/o sob and fatigue. She get tired. No cough or hemoptysis. She denies dietary indiscretion. She has been maintaining NSR. No significant palpitations.  Allergies  Allergen Reactions  . Amiodarone Other (See Comments)    alopecia  . Chantix [Varenicline]     Suicidal thoughts  . Codeine Swelling    Swelling as a young adult  . Lipitor [Atorvastatin] Diarrhea    And cramping.     Current Outpatient Medications  Medication Sig Dispense Refill  . BIOTIN PO Take 1 tablet by mouth daily.    . Calcium Carbonate-Vitamin D (CALCIUM + D PO) Take 1 tablet by mouth daily.    . cloNIDine (CATAPRES) 0.1 MG tablet Take 1 tablet (0.1 mg total) by mouth 3 (three) times daily. 60 tablet 11  . ELIQUIS 5 MG TABS tablet Take 1 tablet (5 mg total) by mouth 2 (two) times daily. Resume 03/02/18 am    . FLUoxetine (PROZAC) 20 MG tablet Take 20 mg by mouth daily.    . furosemide (LASIX) 20 MG tablet Take 1 tablet (20 mg total) by mouth daily. 30 tablet 12  . levothyroxine (SYNTHROID, LEVOTHROID) 100 MCG tablet Take 100 mcg by mouth daily before breakfast.     . loratadine (CLARITIN) 10 MG tablet Take 10 mg by mouth daily as needed for allergies.     Marland Kitchen losartan (COZAAR) 50 MG tablet Take 1 tablet (50 mg total) by mouth daily. 30 tablet 1  . metoprolol tartrate (LOPRESSOR) 50 MG tablet 1 and 1/2 tablet twice daily (Patient taking differently: Take 75 mg by mouth 2 (two) times daily. ) 270 tablet 3  . Multiple Vitamin (MULTIVITAMIN WITH MINERALS) TABS tablet Take 1 tablet by mouth daily.    Vladimir Faster Glycol-Propyl Glycol (SYSTANE OP) Place 1 drop into both eyes daily as needed (dry eyes).    . potassium chloride (K-DUR,KLOR-CON) 10 MEQ tablet Take 10 mEq by mouth daily.    . rosuvastatin (CRESTOR) 20  MG tablet Take 20 mg by mouth daily.     No current facility-administered medications for this visit.      Past Medical History:  Diagnosis Date  . Atrial fibrillation ([place])   . Factor V Leiden ([place])   . Hyperlipidemia   . Hypertension   . Hypertensive heart disease   . Hypothyroidism   . Obesity (BMI 30-39.9)   . Thyroid disease     ROS:   All systems reviewed and negative except as noted in the HPI.   Past Surgical History:  Procedure Laterality Date  . ATRIAL FIBRILLATION ABLATION  12/2014   Dr Glennon Mac at [facility]  . ATRIAL FIBRILLATION ABLATION  08/2015   Dr Glennon Mac at [facility]  . CARDIOVERSION N/A 05/26/2014   Procedure: CARDIOVERSION;  Surgeon: Jacolyn Reedy, MD;  Location: [facility] ENDOSCOPY;  Service: Cardiovascular;  Laterality: N/A;  . CARDIOVERSION N/A 09/14/2014   Procedure: CARDIOVERSION;  Surgeon: Jacolyn Reedy, MD;  [facility] ENDOSCOPY;  Service: Cardiovascular;  Laterality: N/A;  . CARDIOVERSION N/A 05/11/2015   Procedure: CARDIOVERSION;  Surgeon: Jacolyn Reedy, MD;  Location: [place];  Service: Cardiovascular;  Laterality: N/A;  . COSMETIC SURGERY     lower lids  . DENTAL SURGERY     wisdom teeth  . PACEMAKER  IMPLANT N/A 02/26/2018   Procedure: PACEMAKER IMPLANT;  Surgeon: Evans Lance, MD;  Location: [place] CV LAB;  Service: Cardiovascular;  Laterality: N/A;     No family history on file.   Social History   Socioeconomic History  . Marital status: Single    Spouse name: Not on file  . Number of children: Not on file  . Years of education: Not on file  . Highest education level: Not on file  Occupational History  . Not on file  Social Needs  . Financial resource strain: Not on file  . Food insecurity:    Worry: Not on file    Inability: Not on file  . Transportation needs:    Medical: Not on file    Non-medical: Not on file  Tobacco Use  . Smoking status: Former Smoker    Packs/day: 1.00    Years: 40.00    Pack years: 40.00      Last attempt to quit: 12/11/2013    Years since quitting: 4.2  . Smokeless tobacco: Never Used  Substance and Sexual Activity  . Alcohol use: Yes    Alcohol/week: 4.8 oz    Types: 8 Glasses of wine per week  . Drug use: No  . Sexual activity: Never  Lifestyle  . Physical activity:    Days per week: Not on file    Minutes per session: Not on file  . Stress: Not on file  Relationships  . Social connections:    Talks on phone: Not on file    Gets together: Not on file    Attends religious service: Not on file    Active member of club or organization: Not on file    Attends meetings of clubs or organizations: Not on file    Relationship status: Not on file  . Intimate partner violence:    Fear of current or ex partner: Not on file    Emotionally abused: Not on file    Physically abused: Not on file    Forced sexual activity: Not on file  Other Topics Concern  . Not on file  Social History Narrative  . Not on file     BP 124/66   Pulse 60   Ht 5\' 6"  (1.676 m)   Wt 202 lb (91.6 kg)   BMI 32.60 kg/m   Physical Exam:  Well appearing 72 yo woman, NAD HEENT: Unremarkable Neck:  No JVD, no thyromegally Lymphatics:  No adenopathy Back:  No CVA tenderness Lungs:  Clear with no wheezes HEART:  Regular rate rhythm, no murmurs, no rubs, no clicks Abd:  soft, positive bowel sounds, no organomegally, no rebound, no guarding Ext:  2 plus pulses, no edema, no cyanosis, no clubbing Skin:  No rashes no nodules Neuro:  CN II through XII intact, motor grossly intact  EKG - none  DEVICE  Normal device function.  See PaceArt for details. Atrial pacing is ongoing  Assess/Plan: 1. Dyspnea - the etiology is unclear. She has had 2 ablations raising the possibility of pulmonary vein stenosis. She however more than likely has diastolic heart failure. I have asked her to increase her dose of lasix. If better then no additional evaluation will be needed and she will take additional  lasix. If no better than she will need a CT of the chest and a 2D echo and PFT's.  2. PAF - on medical therapy and 2 ablations, she appears to be maintaining NSR. 3. PPM - her device  has been interogated and demonstrates normal function. She has not had any significant atrial fib.  4. Obesity -  She is overweight and I suspect that this is playing a role. 5. Sinus node dysfunction - she is pacing 96% in the atrium.  6. HTN - her blood pressure is good. She c/o a dry mouth. I have asked her to stop the clonidine for now.  Mikle Bosworth.D.

## 2018-03-24 ENCOUNTER — Other Ambulatory Visit: Payer: Self-pay | Admitting: *Deleted

## 2018-03-24 MED ORDER — LOSARTAN POTASSIUM 50 MG PO TABS: 50.0000 mg | ORAL_TABLET | Freq: Every day | ORAL | 1 refills | Status: DC

## 2018-03-26 DIAGNOSIS — G4733 Obstructive sleep apnea (adult) (pediatric): Secondary | ICD-10-CM | POA: Diagnosis not present

## 2018-03-30 ENCOUNTER — Other Ambulatory Visit: Payer: Medicare HMO

## 2018-04-03 LAB — CUP PACEART INCLINIC DEVICE CHECK
Battery Remaining Longevity: 130 mo
Date Time Interrogation Session: 20190730200442
Implantable Lead Implant Date: 20190711
Implantable Pulse Generator Implant Date: 20190711
Lead Channel Impedance Value: 587.5 Ohm
Lead Channel Pacing Threshold Amplitude: 0.5 V
Lead Channel Pacing Threshold Pulse Width: 0.5 ms
Lead Channel Sensing Intrinsic Amplitude: 12 mV
Lead Channel Setting Sensing Sensitivity: 2 mV
MDC IDC LEAD IMPLANT DT: 20190711
MDC IDC LEAD LOCATION: 753859
MDC IDC LEAD LOCATION: 753860
MDC IDC MSMT BATTERY VOLTAGE: 3.08 V
MDC IDC MSMT LEADCHNL RA IMPEDANCE VALUE: 612.5 Ohm
MDC IDC MSMT LEADCHNL RA PACING THRESHOLD AMPLITUDE: 0.625 V
MDC IDC MSMT LEADCHNL RA SENSING INTR AMPL: 4.7 mV
MDC IDC MSMT LEADCHNL RV PACING THRESHOLD PULSEWIDTH: 0.4 ms
MDC IDC SET LEADCHNL RA PACING AMPLITUDE: 1.625
MDC IDC SET LEADCHNL RV PACING AMPLITUDE: 0.75 V
MDC IDC SET LEADCHNL RV PACING PULSEWIDTH: 0.4 ms
MDC IDC STAT BRADY RA PERCENT PACED: 96 %
MDC IDC STAT BRADY RV PERCENT PACED: 0.77 %
Pulse Gen Model: 2272
Pulse Gen Serial Number: 9041042

## 2018-04-05 DIAGNOSIS — J984 Other disorders of lung: Secondary | ICD-10-CM | POA: Diagnosis not present

## 2018-04-05 DIAGNOSIS — J209 Acute bronchitis, unspecified: Secondary | ICD-10-CM | POA: Diagnosis not present

## 2018-04-09 DIAGNOSIS — J22 Unspecified acute lower respiratory infection: Secondary | ICD-10-CM | POA: Diagnosis not present

## 2018-04-13 ENCOUNTER — Other Ambulatory Visit: Payer: Medicare HMO

## 2018-04-21 ENCOUNTER — Encounter: Payer: Self-pay | Admitting: *Deleted

## 2018-05-02 NOTE — Progress Notes (Signed)
05/07/2018 Carly Romero   1946/02/04  [id number]  Primary Physician Rankins, Bill Salinas, MD Primary Cardiologist: initial visit.  EP Carly Norm Salt- [facility], Carly. Crissie Sickles.  HPI:  This is my first visit with Carly Romero. She was initially seen by Carly Ransom PA-C in May 2019. She is a pleasant 72 y/o female with a history of PAF dating back to 79. She had been followed by Carly Romero as her general cardiologist but she has requested transfer of her care to our group. She had RFA in May 2016 and again in Jan 2017 by Carly Romero at [facility]. She has failed Tikosyn and has been intolerant to Amiodarone (alopecia). Since her last RFA she has maintained  NSR well. She is s/p pacemaker implant and is followed by Carly. Lovena Romero.  She has no history of coronary artery disease and has never had a cath but had a stress test remotely.   Her major complaint today is of chronic dyspnea on exertion. She states she cannot do anything without giving out of breath. No cough. No chest pain. No swelling. She does use CPAP managed by Carly. Maxwell Caul. She has long prior history of tobacco abuse. No orthopnea or PND. She has a long history of DVTs with Factor V Leiden deficiency. Has been on anticoagulation since she was 72 years old.    Current Outpatient Medications  Medication Sig Dispense Refill  . BIOTIN PO Take 1 tablet by mouth daily.    . Calcium Carbonate-Vitamin D (CALCIUM + D PO) Take 1 tablet by mouth daily.    Marland Kitchen ELIQUIS 5 MG TABS tablet Take 1 tablet (5 mg total) by mouth 2 (two) times daily. Resume 03/02/18 am    . FLUoxetine (PROZAC) 20 MG tablet Take 20 mg by mouth daily.    . furosemide (LASIX) 20 MG tablet Take 1 tablet (20 mg total) by mouth daily. 30 tablet 12  . levothyroxine (SYNTHROID, LEVOTHROID) 100 MCG tablet Take 100 mcg by mouth daily before breakfast.     . loratadine (CLARITIN) 10 MG tablet Take 10 mg by mouth daily as needed for allergies.     Marland Kitchen losartan (COZAAR) 50 MG tablet Take 1 tablet (50  mg total) by mouth daily. 30 tablet 1  . metoprolol tartrate (LOPRESSOR) 50 MG tablet 1 and 1/2 tablet twice daily (Patient taking differently: Take 75 mg by mouth 2 (two) times daily. ) 270 tablet 3  . Multiple Vitamin (MULTIVITAMIN WITH MINERALS) TABS tablet Take 1 tablet by mouth daily.    Vladimir Faster Glycol-Propyl Glycol (SYSTANE OP) Place 1 drop into both eyes daily as needed (dry eyes).    . potassium chloride (K-DUR,KLOR-CON) 10 MEQ tablet Take 10 mEq by mouth daily.    . rosuvastatin (CRESTOR) 20 MG tablet Take 20 mg by mouth daily.     No current facility-administered medications for this visit.     Allergies  Allergen Reactions  . Amiodarone Other (See Comments)    alopecia  . Chantix [Varenicline]     Suicidal thoughts  . Codeine Swelling    Swelling as a young adult  . Lipitor [Atorvastatin] Diarrhea    And cramping.    Past Medical History:  Diagnosis Date  . Atrial fibrillation ([place])   . Factor V Leiden ([place])   . Hyperlipidemia   . Hypertension   . Hypertensive heart disease   . Hypothyroidism   . Obesity (BMI 30-39.9)   . Thyroid disease     Social  History   Socioeconomic History  . Marital status: Single    Spouse name: Not on file  . Number of children: Not on file  . Years of education: Not on file  . Highest education level: Not on file  Occupational History  . Not on file  Social Needs  . Financial resource strain: Not on file  . Food insecurity:    Worry: Not on file    Inability: Not on file  . Transportation needs:    Medical: Not on file    Non-medical: Not on file  Tobacco Use  . Smoking status: Former Smoker    Packs/day: 1.00    Years: 40.00    Pack years: 40.00    Last attempt to quit: 12/11/2013    Years since quitting: 4.4  . Smokeless tobacco: Never Used  Substance and Sexual Activity  . Alcohol use: Yes    Alcohol/week: 8.0 standard drinks    Types: 8 Glasses of wine per week  . Drug use: No  . Sexual activity: Never    Lifestyle  . Physical activity:    Days per week: Not on file    Minutes per session: Not on file  . Stress: Not on file  Relationships  . Social connections:    Talks on phone: Not on file    Gets together: Not on file    Attends religious service: Not on file    Active member of club or organization: Not on file    Attends meetings of clubs or organizations: Not on file    Relationship status: Not on file  . Intimate partner violence:    Fear of current or ex partner: Not on file    Emotionally abused: Not on file    Physically abused: Not on file    Forced sexual activity: Not on file  Other Topics Concern  . Not on file  Social History Narrative  . Not on file     Family History  Problem Relation Age of Onset  . Diabetes Brother      Review of Systems: As noted in HPI. Otherwise complete ROS is negative.    Blood pressure (!) 152/92, pulse 68, height 5\' 6"  (1.676 m), weight 207 lb (93.9 kg).  GENERAL:  Well appearing obese WF in NAD HEENT:  PERRL, EOMI, sclera are clear. Oropharynx is clear. NECK:  No jugular venous distention, carotid upstroke brisk and symmetric, no bruits, no thyromegaly or adenopathy LUNGS:  Clear to auscultation bilaterally CHEST:  Unremarkable HEART:  RRR,  PMI not displaced or sustained,S1 and S2 within normal limits, no S3, no S4: no clicks, no rubs, no murmurs ABD:  Soft, nontender. BS +, no masses or bruits. No hepatomegaly, no splenomegaly EXT:  2 + pulses throughout, no edema, multiple superficial varicosities.  SKIN:  Warm and dry.  No rashes NEURO:  Alert and oriented x 3. Cranial nerves II through XII intact. PSYCH:  Cognitively intact   Lab Results  Component Value Date   WBC 11.2 (H) 05/04/2018   HGB 14.4 05/04/2018   HCT 41.0 05/04/2018   PLT 223 05/04/2018   GLUCOSE 110 (H) 05/04/2018   CHOL 208 (H) 01/14/2018   TRIG 164 (H) 01/14/2018   HDL 73 01/14/2018   LDLCALC 102 (H) 01/14/2018   ALT 31 02/25/2018   AST 34  02/25/2018   NA 144 05/04/2018   K 4.3 05/04/2018   CL 102 05/04/2018   CREATININE 1.27 (H) 05/04/2018  BUN 20 05/04/2018   CO2 27 05/04/2018   TSH 2.420 05/04/2018   INR 1.37 02/25/2018    Echo 02/25/18: Study Conclusions  - Left ventricle: The cavity size was normal. There was severe   concentric hypertrophy. Systolic function was normal. The   estimated ejection fraction was in the range of 50% to 55%. Wall   motion was normal; there were no regional wall motion   abnormalities. Doppler parameters are consistent with a   reversible restrictive pattern, indicative of decreased left   ventricular diastolic compliance and/or increased left atrial   pressure (grade 3 diastolic dysfunction). - Aortic valve: There was mild to moderate stenosis. There was mild   regurgitation. Mean gradient (S): 9 mm Hg. Peak gradient (S): 21   mm Hg. Valve area (VTI): 1.44 cm^2. - Aorta: The aorta was mildly dilated. Ascending aortic diameter:   36 mm (S). - Mitral valve: Calcified annulus. - Left atrium: The atrium was moderately to severely dilated.   Volume, S: 102 ml. Volume/bsa, S: 48.4 ml/m^2.  Impressions:  - Diastolic dysfunction, severe LVH, calcified aortic and mitral   valves with stenosis/regurgitation as noted, severely dilated   left atrium.  Event monitor June 2019: no Afib.  Carotid dopplers June 2019 no significant stenosis.   ASSESSMENT AND PLAN:  1. Paroxysmal AFib s/p ablation x 2. Maintaining NSR 2. Sinus node dysfunction. S/p pacemaker 3. Chronic dyspnea. I think this is multifactorial. She has some diastolic dysfunction on Echo but does not appear volume overloaded. She has sleep apnea treated with CPAP. She likely has underlying pulmonary disease with prior tobacco use. She is deconditioned. No significant pulmonary HTN on Echo but some concern with history of DVTs that she could have PEs in the past. Recommend pulmonary evaluation. Encourage daily walking program.    4. HTN controlled.  5. OSA on CPAP 6. Recurrent DVTs with Factor V Leiden deficiency. On chronic anticoagulation.

## 2018-05-04 ENCOUNTER — Other Ambulatory Visit: Payer: Self-pay

## 2018-05-04 DIAGNOSIS — E039 Hypothyroidism, unspecified: Secondary | ICD-10-CM

## 2018-05-04 DIAGNOSIS — I481 Persistent atrial fibrillation: Secondary | ICD-10-CM | POA: Diagnosis not present

## 2018-05-04 DIAGNOSIS — I4819 Other persistent atrial fibrillation: Secondary | ICD-10-CM

## 2018-05-05 LAB — TSH: TSH: 2.42 u[IU]/mL (ref 0.450–4.500)

## 2018-05-05 LAB — BASIC METABOLIC PANEL
BUN/Creatinine Ratio: 16 (ref 12–28)
BUN: 20 mg/dL (ref 8–27)
CO2: 27 mmol/L (ref 20–29)
CREATININE: 1.27 mg/dL — AB (ref 0.57–1.00)
Calcium: 9.3 mg/dL (ref 8.7–10.3)
Chloride: 102 mmol/L (ref 96–106)
GFR calc Af Amer: 49 mL/min/{1.73_m2} — ABNORMAL LOW (ref >59)
GFR, EST NON AFRICAN AMERICAN: 43 mL/min/{1.73_m2} — AB (ref >59)
Glucose: 110 mg/dL — ABNORMAL HIGH (ref 65–99)
POTASSIUM: 4.3 mmol/L (ref 3.5–5.2)
Sodium: 144 mmol/L (ref 134–144)

## 2018-05-05 LAB — CBC WITH DIFFERENTIAL/PLATELET
BASOS: 0 %
Basophils Absolute: 0 10*3/uL (ref 0.0–0.2)
EOS (ABSOLUTE): 0.2 10*3/uL (ref 0.0–0.4)
EOS: 2 %
HEMATOCRIT: 41 % (ref 34.0–46.6)
HEMOGLOBIN: 14.4 g/dL (ref 11.1–15.9)
IMMATURE GRANS (ABS): 0 10*3/uL (ref 0.0–0.1)
IMMATURE GRANULOCYTES: 0 %
Lymphocytes Absolute: 2.4 10*3/uL (ref 0.7–3.1)
Lymphs: 22 %
MCH: 31 pg (ref 26.6–33.0)
MCHC: 35.1 g/dL (ref 31.5–35.7)
MCV: 88 fL (ref 79–97)
MONOCYTES: 8 %
Monocytes Absolute: 0.9 10*3/uL (ref 0.1–0.9)
NEUTROS PCT: 68 %
Neutrophils Absolute: 7.5 10*3/uL — ABNORMAL HIGH (ref 1.4–7.0)
Platelets: 223 10*3/uL (ref 150–450)
RBC: 4.65 x10E6/uL (ref 3.77–5.28)
RDW: 14.5 % (ref 12.3–15.4)
WBC: 11.2 10*3/uL — AB (ref 3.4–10.8)

## 2018-05-07 ENCOUNTER — Encounter: Payer: Self-pay | Admitting: Cardiology

## 2018-05-07 ENCOUNTER — Ambulatory Visit: Payer: Medicare HMO | Admitting: Cardiology

## 2018-05-07 VITALS — BP 152/92 | HR 68 | Ht 66.0 in | Wt 207.0 lb

## 2018-05-07 DIAGNOSIS — I10 Essential (primary) hypertension: Secondary | ICD-10-CM

## 2018-05-07 DIAGNOSIS — I495 Sick sinus syndrome: Secondary | ICD-10-CM | POA: Diagnosis not present

## 2018-05-07 DIAGNOSIS — R0602 Shortness of breath: Secondary | ICD-10-CM

## 2018-05-07 DIAGNOSIS — I480 Paroxysmal atrial fibrillation: Secondary | ICD-10-CM

## 2018-05-07 NOTE — Patient Instructions (Signed)
We will refer you to pulmonary  Continue your current therapy  I will see you in 6 months

## 2018-05-13 ENCOUNTER — Encounter: Payer: Self-pay | Admitting: Emergency Medicine

## 2018-05-13 ENCOUNTER — Ambulatory Visit: Payer: Medicare HMO | Admitting: Emergency Medicine

## 2018-05-13 VITALS — BP 122/74 | HR 66 | Ht 65.0 in | Wt 205.6 lb

## 2018-05-13 DIAGNOSIS — J310 Chronic rhinitis: Secondary | ICD-10-CM | POA: Insufficient documentation

## 2018-05-13 DIAGNOSIS — R0600 Dyspnea, unspecified: Secondary | ICD-10-CM | POA: Diagnosis not present

## 2018-05-13 DIAGNOSIS — Z23 Encounter for immunization: Secondary | ICD-10-CM | POA: Diagnosis not present

## 2018-05-13 MED ORDER — AZELASTINE HCL 0.1 % NA SOLN: 2.0000 | Freq: Two times a day (BID) | NASAL | 12 refills | Status: DC

## 2018-05-13 NOTE — Assessment & Plan Note (Signed)
She has not gotten much response from antihistamines.  We may decide to restart one at some point in the future.  She will continue her nasal steroid.  She has breakthrough symptoms and asked her to start using Astelin nasal spray as needed to see if we can get better control.  Hopefully this will also help control her occasional cough.

## 2018-05-13 NOTE — Progress Notes (Signed)
Subjective:    Patient ID: Carly Romero, female    DOB: [birth year], 72 y.o.   MRN: [id number]  HPI 72 year old former smoker (40 pack years) with a history of DVT / PE, factor V Leiden homozygote, atrial fibrillation status post radiofrequency ablation in the past with pacemaker (also on Tikosyn amiodarone in the past, not currently), hypertension with diastolic dysfunction, obstructive sleep apnea on CPAP.  She is referred today for shortness of breath.  She tells me that she has been experiencing progressive SOB for 5 yrs, more noticeable exertional SOB since about 2 yrs ago. She describes persistent nasal congestion, worse over the last several months. Hasn't seemed to be helped recently by anti-histamines, nasal steroid. She was on allergy shots as a young adult. She has some occasional cough, clear mucous esp in the am. Has been on loratadine and xyzal in the past, not currently. She is using fluticasone nasal spray daily.   She doesn't believe that she has been in A Fib since her pacer earlier this year.   She has had episodic bronchitis over the years, has been treated w abx, pred for episodes that sound like AE-COPD.   Chest x-ray 02/27/2018 was reviewed by me, shows some mild hyperinflation with some prominent coarse interstitial markings, no evidence of infiltrate, effusion.  Pacemaker in place.   TTE 02/25/18 -- EF 56%, diastolic dysfxn, normal RV fxn.    Review of Systems  Constitutional: Positive for unexpected weight change. Negative for fever.  HENT: Positive for congestion and postnasal drip. Negative for dental problem, ear pain, nosebleeds, rhinorrhea, sinus pressure, sneezing, sore throat and trouble swallowing.   Eyes: Negative for redness and itching.  Respiratory: Positive for cough, shortness of breath and wheezing. Negative for chest tightness.   Cardiovascular: Positive for palpitations and leg swelling.  Gastrointestinal: Negative for nausea and vomiting.    Genitourinary: Negative for dysuria.  Musculoskeletal: Negative for joint swelling.  Skin: Negative for rash.  Allergic/Immunologic: Positive for environmental allergies and food allergies. Negative for immunocompromised state.  Neurological: Negative for headaches.  Hematological: Does not bruise/bleed easily.  Psychiatric/Behavioral: Positive for dysphoric mood. The patient is nervous/anxious.    Past Medical History:  Diagnosis Date  . Atrial fibrillation ([place])   . Factor V Leiden ([place])   . Hyperlipidemia   . Hypertension   . Hypertensive heart disease   . Hypothyroidism   . Obesity (BMI 30-39.9)   . Thyroid disease      Family History  Problem Relation Age of Onset  . Diabetes Brother      Social History   Socioeconomic History  . Marital status: Single    Spouse name: Not on file  . Number of children: Not on file  . Years of education: Not on file  . Highest education level: Not on file  Occupational History  . Not on file  Social Needs  . Financial resource strain: Not on file  . Food insecurity:    Worry: Not on file    Inability: Not on file  . Transportation needs:    Medical: Not on file    Non-medical: Not on file  Tobacco Use  . Smoking status: Former Smoker    Packs/day: 1.00    Years: 40.00    Pack years: 40.00    Last attempt to quit: 12/11/2013    Years since quitting: 4.4  . Smokeless tobacco: Never Used  Substance and Sexual Activity  . Alcohol use: Yes    Alcohol/week:  8.0 standard drinks    Types: 8 Glasses of wine per week  . Drug use: No  . Sexual activity: Never  Lifestyle  . Physical activity:    Days per week: Not on file    Minutes per session: Not on file  . Stress: Not on file  Relationships  . Social connections:    Talks on phone: Not on file    Gets together: Not on file    Attends religious service: Not on file    Active member of club or organization: Not on file    Attends meetings of clubs or organizations: Not  on file    Relationship status: Not on file  . Intimate partner violence:    Fear of current or ex partner: Not on file    Emotionally abused: Not on file    Physically abused: Not on file    Forced sexual activity: Not on file  Other Topics Concern  . Not on file  Social History Narrative  . 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   Allergies  Allergen Reactions  . Amiodarone Other (See Comments)    alopecia  . Chantix [Varenicline]     Suicidal thoughts  . Codeine Swelling    Swelling as a young adult  . Lipitor [Atorvastatin] Diarrhea    And cramping.     Outpatient Medications Prior to Visit  Medication Sig Dispense Refill  . BIOTIN PO Take 1 tablet by mouth daily.    . Calcium Carbonate-Vitamin D (CALCIUM + D PO) Take 1 tablet by mouth daily.    Marland Kitchen ELIQUIS 5 MG TABS tablet Take 1 tablet (5 mg total) by mouth 2 (two) times daily. Resume 03/02/18 am    . FLUoxetine (PROZAC) 20 MG tablet Take 20 mg by mouth daily.    . furosemide (LASIX) 20 MG tablet Take 1 tablet (20 mg total) by mouth daily. 30 tablet 12  . levothyroxine (SYNTHROID, LEVOTHROID) 100 MCG tablet Take 100 mcg by mouth daily before breakfast.     . loratadine (CLARITIN) 10 MG tablet Take 10 mg by mouth daily as needed for allergies.     Marland Kitchen losartan (COZAAR) 50 MG tablet Take 1 tablet (50 mg total) by mouth daily. 30 tablet 1  . metoprolol tartrate (LOPRESSOR) 50 MG tablet 1 and 1/2 tablet twice daily (Patient taking differently: Take 75 mg by mouth 2 (two) times daily. ) 270 tablet 3  . Multiple Vitamin (MULTIVITAMIN WITH MINERALS) TABS tablet Take 1 tablet by mouth daily.    Vladimir Faster Glycol-Propyl Glycol (SYSTANE OP) Place 1 drop into both eyes daily as needed (dry eyes).    . potassium chloride (K-DUR,KLOR-CON) 10 MEQ tablet Take 10 mEq by mouth daily.    . rosuvastatin (CRESTOR) 20 MG tablet Take 20 mg by mouth daily.     No facility-administered  medications prior to visit.         Objective:   Physical Exam Vitals:   05/13/18 1517  BP: 122/74  Pulse: 66  SpO2: 96%  Weight: 205 lb 9.6 oz (93.3 kg)  Height: 5\' 5"  (1.651 m)   Gen: Pleasant, well-nourished, in no distress,  normal affect  ENT: No lesions,  mouth clear,  oropharynx clear, no postnasal drip  Neck: No JVD, no stridor  Lungs: No use of accessory muscles, no crackles or wheezes.   Cardiovascular: RRR, heart sounds normal, no  murmur or gallops, no peripheral edema  Musculoskeletal: No deformities, no cyanosis or clubbing  Neuro: alert, non focal  Skin: Warm, no lesions or rash     Assessment & Plan:  Dyspnea on exertion Certainly could be multifactorial.  She noticed that it was more bothersome about the time that she started having to deal with her atrial fibrillation.  Her A. fib has been well managed but the dyspnea persists.  I suspect that she does have some degree of COPD.  I like to quantify this with pulmonary function testing.  Once we have done so we will talk about starting bronchodilator therapy.  Certainly her history of thromboembolic disease puts her at risk but I do not see any other symptoms pointing at recurrent thromboembolism.  She is on Eliquis and has good compliance.  Her most recent echocardiogram did not show any evidence for RV dysfunction.  Chronic rhinitis She has not gotten much response from antihistamines.  We may decide to restart one at some point in the future.  She will continue her nasal steroid.  She has breakthrough symptoms and asked her to start using Astelin nasal spray as needed to see if we can get better control.  Hopefully this will also help control her occasional cough.  Baltazar Apo, MD, PhD 05/13/2018, 3:58 PM [facility] [phone] or if no answer [phone]

## 2018-05-13 NOTE — Patient Instructions (Signed)
Please continue your fluticasone nasal spray 2 sprays each nostril once daily. We will start Astelin nasal spray 2 sprays each nostril 2-3 times daily if needed for congestion or drainage. We will perform full pulmonary function testing at your next office visit. Follow with Dr Lamonte Sakai next available with full PFT

## 2018-05-13 NOTE — Assessment & Plan Note (Signed)
Certainly could be multifactorial.  She noticed that it was more bothersome about the time that she started having to deal with her atrial fibrillation.  Her A. fib has been well managed but the dyspnea persists.  I suspect that she does have some degree of COPD.  I like to quantify this with pulmonary function testing.  Once we have done so we will talk about starting bronchodilator therapy.  Certainly her history of thromboembolic disease puts her at risk but I do not see any other symptoms pointing at recurrent thromboembolism.  She is on Eliquis and has good compliance.  Her most recent echocardiogram did not show any evidence for RV dysfunction.

## 2018-05-18 ENCOUNTER — Other Ambulatory Visit: Payer: Self-pay

## 2018-05-18 DIAGNOSIS — H02831 Dermatochalasis of right upper eyelid: Secondary | ICD-10-CM | POA: Diagnosis not present

## 2018-05-18 DIAGNOSIS — Z7901 Long term (current) use of anticoagulants: Secondary | ICD-10-CM | POA: Diagnosis not present

## 2018-05-18 DIAGNOSIS — H02429 Myogenic ptosis of unspecified eyelid: Secondary | ICD-10-CM | POA: Diagnosis not present

## 2018-05-18 DIAGNOSIS — H02834 Dermatochalasis of left upper eyelid: Secondary | ICD-10-CM | POA: Diagnosis not present

## 2018-05-18 DIAGNOSIS — H0289 Other specified disorders of eyelid: Secondary | ICD-10-CM | POA: Diagnosis not present

## 2018-05-18 DIAGNOSIS — H57813 Brow ptosis, bilateral: Secondary | ICD-10-CM | POA: Diagnosis not present

## 2018-05-18 DIAGNOSIS — I4819 Other persistent atrial fibrillation: Secondary | ICD-10-CM

## 2018-05-29 ENCOUNTER — Encounter: Payer: Medicare HMO | Admitting: Internal Medicine

## 2018-06-01 ENCOUNTER — Other Ambulatory Visit: Payer: Self-pay

## 2018-06-01 MED ORDER — LOSARTAN POTASSIUM 50 MG PO TABS: 50.0000 mg | ORAL_TABLET | Freq: Every day | ORAL | 1 refills | Status: DC

## 2018-06-10 ENCOUNTER — Encounter: Payer: Medicare HMO | Admitting: Internal Medicine

## 2018-06-11 DIAGNOSIS — Z961 Presence of intraocular lens: Secondary | ICD-10-CM | POA: Diagnosis not present

## 2018-06-11 DIAGNOSIS — H04123 Dry eye syndrome of bilateral lacrimal glands: Secondary | ICD-10-CM | POA: Diagnosis not present

## 2018-06-18 ENCOUNTER — Encounter: Payer: Self-pay | Admitting: Internal Medicine

## 2018-06-18 ENCOUNTER — Ambulatory Visit (INDEPENDENT_AMBULATORY_CARE_PROVIDER_SITE_OTHER): Payer: Medicare HMO | Admitting: Internal Medicine

## 2018-06-18 VITALS — BP 128/78 | HR 60 | Ht 65.0 in | Wt 207.8 lb

## 2018-06-18 DIAGNOSIS — I495 Sick sinus syndrome: Secondary | ICD-10-CM | POA: Diagnosis not present

## 2018-06-18 DIAGNOSIS — R0602 Shortness of breath: Secondary | ICD-10-CM

## 2018-06-18 DIAGNOSIS — Z950 Presence of cardiac pacemaker: Secondary | ICD-10-CM | POA: Diagnosis not present

## 2018-06-18 DIAGNOSIS — I4819 Other persistent atrial fibrillation: Secondary | ICD-10-CM

## 2018-06-18 DIAGNOSIS — I10 Essential (primary) hypertension: Secondary | ICD-10-CM

## 2018-06-18 NOTE — Patient Instructions (Signed)
Medication Instructions:  Your physician recommends that you continue on your current medications as directed. Please refer to the Current Medication list given to you today.  If you need a refill on your cardiac medications before your next appointment, please call your pharmacy.   Lab work: You will get lab work today:  BMP  If you have labs (blood work) drawn today and your tests are completely normal, you will receive your results only by: Marland Kitchen MyChart Message (if you have MyChart) OR . A paper copy in the mail If you have any lab test that is abnormal or we need to change your treatment, we will call you to review the results.  Testing/Procedures: None ordered.  Follow-Up:  Your physician wants you to follow-up in: 9 months with Dr. Lovena Le.    You will receive a reminder letter in the mail two months in advance. If you don't receive a letter, please call our office to schedule the follow-up appointment.  Remote monitoring is used to monitor your Pacemaker from home. This monitoring reduces the number of office visits required to check your device to one time per year. It allows [place] to keep an eye on the functioning of your device to ensure it is working properly. You are scheduled for a device check from home on 09/17/2018. You may send your transmission at any time that day. If you have a wireless device, the transmission will be sent automatically. After your physician reviews your transmission, you will receive a postcard with your next transmission date.  At [facility], you and your health needs are our priority.  As part of our continuing mission to provide you with exceptional heart care, we have created designated Provider Care Teams.  These Care Teams include your primary Cardiologist (physician) and Advanced Practice Providers (APPs -  Physician Assistants and Nurse Practitioners) who all work together to provide you with the care you need, when you need it. Carly Romero may see [name], MD or one of the following Advanced Practice Providers on your designated Care Team:   . Chanetta Marshall, NP . Tommye Standard, PA-C   Any Other Special Instructions Will Be Listed Below (If Applicable).

## 2018-06-18 NOTE — Progress Notes (Signed)
HPI Ms. Burnette returns today for followup of her PPM and ongoing evaluation and management of PAF. She is a 72 yo woman who has had 2 ablations. She has been maintained on systemic anti-coagulation. Since her PPM was placed back in July, she has done well in the interim with no chest pain. She has occaisional palpitations. No edema. She is pending a trip to visit a friend in [place]. Allergies  Allergen Reactions  . Amiodarone Other (See Comments)    alopecia  . Chantix [Varenicline]     Suicidal thoughts  . Codeine Swelling    Swelling as a young adult  . Lipitor [Atorvastatin] Diarrhea    And cramping.     Current Outpatient Medications  Medication Sig Dispense Refill  . BIOTIN PO Take 1 tablet by mouth daily.    . Calcium Carbonate-Vitamin D (CALCIUM + D PO) Take 1 tablet by mouth daily.    Marland Kitchen ELIQUIS 5 MG TABS tablet Take 1 tablet (5 mg total) by mouth 2 (two) times daily. Resume 03/02/18 am    . FLUoxetine (PROZAC) 20 MG tablet Take 20 mg by mouth daily.    . furosemide (LASIX) 20 MG tablet Take 1 tablet (20 mg total) by mouth daily. 30 tablet 12  . levothyroxine (SYNTHROID, LEVOTHROID) 100 MCG tablet Take 100 mcg by mouth daily before breakfast.     . loratadine (CLARITIN) 10 MG tablet Take 10 mg by mouth daily as needed for allergies.     Marland Kitchen losartan (COZAAR) 50 MG tablet Take 1 tablet (50 mg total) by mouth daily. 30 tablet 1  . metoprolol tartrate (LOPRESSOR) 50 MG tablet Take 75 mg by mouth 2 (two) times daily.    . Multiple Vitamin (MULTIVITAMIN WITH MINERALS) TABS tablet Take 1 tablet by mouth daily.    Vladimir Faster Glycol-Propyl Glycol (SYSTANE OP) Place 1 drop into both eyes daily as needed (dry eyes).    . potassium chloride (K-DUR,KLOR-CON) 10 MEQ tablet Take 10 mEq by mouth daily.    . rosuvastatin (CRESTOR) 20 MG tablet Take 20 mg by mouth daily.     No current facility-administered medications for this visit.      Past Medical History:  Diagnosis Date  .  Atrial fibrillation ([place])   . Factor V Leiden ([place])   . Hyperlipidemia   . Hypertension   . Hypertensive heart disease   . Hypothyroidism   . Obesity (BMI 30-39.9)   . Thyroid disease     ROS:   All systems reviewed and negative except as noted in the HPI.   Past Surgical History:  Procedure Laterality Date  . ATRIAL FIBRILLATION ABLATION  12/2014   Dr Glennon Mac at [facility]  . ATRIAL FIBRILLATION ABLATION  08/2015   Dr Glennon Mac at [facility]  . CARDIOVERSION N/A 05/26/2014   Procedure: CARDIOVERSION;  Surgeon: Jacolyn Reedy, MD;  Location: [facility] ENDOSCOPY;  Service: Cardiovascular;  Laterality: N/A;  . CARDIOVERSION N/A 09/14/2014   Procedure: CARDIOVERSION;  Surgeon: Jacolyn Reedy, MD;  Location: [facility] ENDOSCOPY;  Service: Cardiovascular;  Laterality: N/A;  . CARDIOVERSION N/A 05/11/2015   Procedure: CARDIOVERSION;  Surgeon: Jacolyn Reedy, MD;  Location: [facility];  Service: Cardiovascular;  Laterality: N/A;  . COSMETIC SURGERY     lower lids  . DENTAL SURGERY     wisdom teeth  . PACEMAKER IMPLANT N/A 02/26/2018   Procedure: PACEMAKER IMPLANT;  Surgeon: Evans Lance, MD;  Location: [place] CV LAB;  Service:  Cardiovascular;  Laterality: N/A;     Family History  Problem Relation Age of Onset  . Diabetes Brother      Social History   Socioeconomic History  . Marital status: Single    Spouse name: Not on file  . Number of children: Not on file  . Years of education: Not on file  . Highest education level: Not on file  Occupational History  . Not on file  Social Needs  . Financial resource strain: Not on file  . Food insecurity:    Worry: Not on file    Inability: Not on file  . Transportation needs:    Medical: Not on file    Non-medical: Not on file  Tobacco Use  . Smoking status: Former Smoker    Packs/day: 1.00    Years: 40.00    Pack years: 40.00    Last attempt to quit: 12/11/2013    Years since quitting: 4.5  . Smokeless tobacco: Never Used  Substance  and Sexual Activity  . Alcohol use: Yes    Alcohol/week: 8.0 standard drinks    Types: 8 Glasses of wine per week  . Drug use: No  . Sexual activity: Never  Lifestyle  . Physical activity:    Days per week: Not on file    Minutes per session: Not on file  . Stress: Not on file  Relationships  . Social connections:    Talks on phone: Not on file    Gets together: Not on file    Attends religious service: Not on file    Active member of club or organization: Not on file    Attends meetings of clubs or organizations: Not on file    Relationship status: Not on file  . Intimate partner violence:    Fear of current or ex partner: Not on file    Emotionally abused: Not on file    Physically abused: Not on file    Forced sexual activity: Not on file  Other Topics Concern  . Not on file  Social History Narrative  . Not on file     BP 128/78   Pulse 60   Ht 5\' 5"  (1.651 m)   Wt 207 lb 12.8 oz (94.3 kg)   SpO2 97%   BMI 34.58 kg/m   Physical Exam:  Well appearing NAD HEENT: Unremarkable Neck:  No JVD, no thyromegally Lymphatics:  No adenopathy Back:  No CVA tenderness Lungs:  Clear with no wheezes HEART:  Regular rate rhythm, no murmurs, no rubs, no clicks Abd:  soft, positive bowel sounds, no organomegally, no rebound, no guarding Ext:  2 plus pulses, no edema, no cyanosis, no clubbing Skin:  No rashes no nodules Neuro:  CN II through XII intact, motor grossly intact  EKG - nsr with atrial pacing  DEVICE  Normal device function.  See PaceArt for details.   Assess/Plan: 1. Sinus node dysfunction - she is doing well, s/p PPM insertion. 2. PPM - interogation of her St[employer] Jude DDD PM demonstrates normal device function.  3. HTN - her blood pressure today is well controlled. We will follow.  Mikle Bosworth.D.

## 2018-06-19 LAB — BASIC METABOLIC PANEL
BUN / CREAT RATIO: 23 (ref 12–28)
BUN: 25 mg/dL (ref 8–27)
CHLORIDE: 106 mmol/L (ref 96–106)
CO2: 23 mmol/L (ref 20–29)
Calcium: 9 mg/dL (ref 8.7–10.3)
Creatinine, Ser: 1.07 mg/dL — ABNORMAL HIGH (ref 0.57–1.00)
GFR calc Af Amer: 60 mL/min/{1.73_m2} (ref >59)
GFR calc non Af Amer: 52 mL/min/{1.73_m2} — ABNORMAL LOW (ref >59)
GLUCOSE: 99 mg/dL (ref 65–99)
Potassium: 4.1 mmol/L (ref 3.5–5.2)
SODIUM: 144 mmol/L (ref 134–144)

## 2018-06-22 ENCOUNTER — Telehealth: Payer: Self-pay

## 2018-06-22 NOTE — Telephone Encounter (Signed)
Carly Romero. Per Dr.Jordan's nurse Malachy Mood pt will not need labs sch for 06/23/18 @ the [address] office. Lab appt cancelled. Pt is to f/u with Dr.Jordan as planned. She is to call the office if any questions or concerns.

## 2018-06-22 NOTE — Telephone Encounter (Signed)
-----   Message from Michaelyn Barter, RN sent at 06/22/2018  4:29 PM EST ----- Malachy Mood or Triage,  Patient on schedule tomorrow for lab work- does not look like she needs any lab work done. Can you please call and confirm?  Thanks, Pam

## 2018-06-23 ENCOUNTER — Other Ambulatory Visit: Payer: Medicare HMO

## 2018-06-23 ENCOUNTER — Other Ambulatory Visit: Payer: Self-pay | Admitting: Cardiology

## 2018-06-23 NOTE — Telephone Encounter (Signed)
Rx request sent to pharmacy.  

## 2018-06-24 DIAGNOSIS — G4733 Obstructive sleep apnea (adult) (pediatric): Secondary | ICD-10-CM | POA: Diagnosis not present

## 2018-06-25 ENCOUNTER — Encounter: Payer: Self-pay | Admitting: Emergency Medicine

## 2018-06-25 ENCOUNTER — Ambulatory Visit (INDEPENDENT_AMBULATORY_CARE_PROVIDER_SITE_OTHER): Payer: Medicare HMO | Admitting: Emergency Medicine

## 2018-06-25 ENCOUNTER — Ambulatory Visit: Payer: Medicare HMO | Admitting: Emergency Medicine

## 2018-06-25 DIAGNOSIS — R0609 Other forms of dyspnea: Secondary | ICD-10-CM

## 2018-06-25 DIAGNOSIS — R0600 Dyspnea, unspecified: Secondary | ICD-10-CM

## 2018-06-25 DIAGNOSIS — G4733 Obstructive sleep apnea (adult) (pediatric): Secondary | ICD-10-CM

## 2018-06-25 DIAGNOSIS — J449 Chronic obstructive pulmonary disease, unspecified: Secondary | ICD-10-CM | POA: Diagnosis not present

## 2018-06-25 DIAGNOSIS — G4730 Sleep apnea, unspecified: Secondary | ICD-10-CM | POA: Insufficient documentation

## 2018-06-25 LAB — PULMONARY FUNCTION TEST
DL/VA % PRED: 71 %
DL/VA: 3.32 ml/min/mmHg/L
DLCO COR: 13.14 ml/min/mmHg
DLCO cor % pred: 57 %
DLCO unc % pred: 59 %
DLCO unc: 13.53 ml/min/mmHg
FEF 25-75 Post: 0.86 L/sec
FEF 25-75 Pre: 0.46 L/sec
FEF2575-%Change-Post: 89 %
FEF2575-%Pred-Post: 48 %
FEF2575-%Pred-Pre: 25 %
FEV1-%CHANGE-POST: 25 %
FEV1-%PRED-PRE: 48 %
FEV1-%Pred-Post: 61 %
FEV1-POST: 1.32 L
FEV1-Pre: 1.05 L
FEV1FVC-%Change-Post: 4 %
FEV1FVC-%Pred-Pre: 73 %
FEV6-%Change-Post: 19 %
FEV6-%PRED-PRE: 67 %
FEV6-%Pred-Post: 80 %
FEV6-POST: 2.18 L
FEV6-PRE: 1.82 L
FEV6FVC-%Change-Post: 0 %
FEV6FVC-%PRED-PRE: 101 %
FEV6FVC-%Pred-Post: 101 %
FVC-%CHANGE-POST: 19 %
FVC-%PRED-POST: 79 %
FVC-%PRED-PRE: 65 %
FVC-POST: 2.25 L
FVC-PRE: 1.87 L
POST FEV6/FVC RATIO: 97 %
PRE FEV1/FVC RATIO: 56 %
PRE FEV6/FVC RATIO: 97 %
Post FEV1/FVC ratio: 59 %
RV % PRED: 135 %
RV: 2.93 L
TLC % pred: 104 %
TLC: 5.12 L

## 2018-06-25 MED ORDER — TIOTROPIUM BROMIDE-OLODATEROL 2.5-2.5 MCG/ACT IN AERS: 2.0000 | INHALATION_SPRAY | Freq: Every day | RESPIRATORY_TRACT | 0 refills | Status: DC

## 2018-06-25 NOTE — Assessment & Plan Note (Signed)
Continue CPAP every night. 

## 2018-06-25 NOTE — Assessment & Plan Note (Signed)
Clear obstruction with a positive bronchodilator response noted on today's coronary function testing.  She remains limited with regard to exertion.  I think we should do a trial of Stiolto to see if she benefits.  Given her decreased diffusion capacity we will also check a walking oximetry today.  We will do a trial of Stiolto 2 puffs once daily.  Please call us to let us know if this medication helps you.  If so then we will order it through your pharmacy and continue. Walking oximetry today on room air. Follow with Dr Lamonte Sakai in 6 months or sooner if you have any problems

## 2018-06-25 NOTE — Progress Notes (Signed)
Patient completed full PFT today. 

## 2018-06-25 NOTE — Patient Instructions (Addendum)
We will do a trial of Stiolto 2 puffs once daily.  Please call us to let us know if this medication helps you.  If so then we will order it through your pharmacy and continue. Walking oximetry today on room air. Please continue your other medications as you have been taking them. Please continue your CPAP every night while sleeping. Follow with Dr Lamonte Sakai in 6 months or sooner if you have any problems

## 2018-06-25 NOTE — Progress Notes (Signed)
Subjective:    Patient ID: Carly Romero, female    DOB: 1945/09/16, 72 y.o.   MRN: [id number]  HPI 72 year old former smoker (40 pack years) with a history of DVT / PE, factor V Leiden homozygote, atrial fibrillation status post radiofrequency ablation in the past with pacemaker (also on Tikosyn amiodarone in the past, not currently), hypertension with diastolic dysfunction, obstructive sleep apnea on CPAP.  She is referred today for shortness of breath.  She tells me that she has been experiencing progressive SOB for 5 yrs, more noticeable exertional SOB since about 2 yrs ago. She describes persistent nasal congestion, worse over the last several months. Hasn't seemed to be helped recently by anti-histamines, nasal steroid. She was on allergy shots as a young adult. She has some occasional cough, clear mucous esp in the am. Has been on loratadine and xyzal in the past, not currently. She is using fluticasone nasal spray daily.   She doesn't believe that she has been in A Fib since her pacer earlier this year.   She has had episodic bronchitis over the years, has been treated w abx, pred for episodes that sound like AE-COPD.   Chest x-ray 02/27/2018 was reviewed by me, shows some mild hyperinflation with some prominent coarse interstitial markings, no evidence of infiltrate, effusion.  Pacemaker in place.   TTE 02/25/18 -- EF 56%, diastolic dysfxn, normal RV fxn.     ROV 06/25/18 --this follow-up visit for exertional dyspnea.  Patient has a history of tobacco use, DVT/PE (factor V Leiden homozygous), atrial fibrillation, hypertension, OSA on CPAP.  She is on Eliquis for her A. fib and also history of PE.  At her last visit we arrange for pulmonary function testing and these were done today.  I have reviewed.  This shows severe obstruction with a positive bronchodilator response consistent with COPD/asthma.  Her lung volumes show borderline hyperinflation.  Her diffusion capacity is decreased and does  not fully correct when adjusted for alveolar volume. Her functional capacity remains limited.    Review of Systems  Constitutional: Positive for unexpected weight change. Negative for fever.  HENT: Positive for congestion and postnasal drip. Negative for dental problem, ear pain, nosebleeds, rhinorrhea, sinus pressure, sneezing, sore throat and trouble swallowing.   Eyes: Negative for redness and itching.  Respiratory: Positive for cough, shortness of breath and wheezing. Negative for chest tightness.   Cardiovascular: Positive for palpitations and leg swelling.  Gastrointestinal: Negative for nausea and vomiting.  Genitourinary: Negative for dysuria.  Musculoskeletal: Negative for joint swelling.  Skin: Negative for rash.  Allergic/Immunologic: Positive for environmental allergies and food allergies. Negative for immunocompromised state.  Neurological: Negative for headaches.  Hematological: Does not bruise/bleed easily.  Psychiatric/Behavioral: Positive for dysphoric mood. The patient is nervous/anxious.    Past Medical History:  Diagnosis Date  . Atrial fibrillation ([place])   . Factor V Leiden ([place])   . Hyperlipidemia   . Hypertension   . Hypertensive heart disease   . Hypothyroidism   . Obesity (BMI 30-39.9)   . Thyroid disease      Family History  Problem Relation Age of Onset  . Diabetes Brother      Social History   Socioeconomic History  . Marital status: Single    Spouse name: Not on file  . Number of children: Not on file  . Years of education: Not on file  . Highest education level: Not on file  Occupational History  . Not on file  Social  Needs  . Financial resource strain: Not on file  . Food insecurity:    Worry: Not on file    Inability: Not on file  . Transportation needs:    Medical: Not on file    Non-medical: Not on file  Tobacco Use  . Smoking status: Former Smoker    Packs/day: 1.00    Years: 40.00    Pack years: 40.00    Last attempt to  quit: 12/11/2013    Years since quitting: 4.5  . Smokeless tobacco: Never Used  Substance and Sexual Activity  . Alcohol use: Yes    Alcohol/week: 8.0 standard drinks    Types: 8 Glasses of wine per week  . Drug use: No  . Sexual activity: Never  Lifestyle  . Physical activity:    Days per week: Not on file    Minutes per session: Not on file  . Stress: Not on file  Relationships  . Social connections:    Talks on phone: Not on file    Gets together: Not on file    Attends religious service: Not on file    Active member of club or organization: Not on file    Attends meetings of clubs or organizations: Not on file    Relationship status: Not on file  . Intimate partner violence:    Fear of current or ex partner: Not on file    Emotionally abused: Not on file    Physically abused: Not on file    Forced sexual activity: Not on file  Other Topics Concern  . Not on file  Social History Narrative  . 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   Allergies  Allergen Reactions  . Amiodarone Other (See Comments)    alopecia  . Chantix [Varenicline]     Suicidal thoughts  . Codeine Swelling    Swelling as a young adult  . Lipitor [Atorvastatin] Diarrhea    And cramping.     Outpatient Medications Prior to Visit  Medication Sig Dispense Refill  . BIOTIN PO Take 1 tablet by mouth daily.    . Calcium Carbonate-Vitamin D (CALCIUM + D PO) Take 1 tablet by mouth daily.    Marland Kitchen ELIQUIS 5 MG TABS tablet Take 1 tablet (5 mg total) by mouth 2 (two) times daily. Resume 03/02/18 am    . FLUoxetine (PROZAC) 20 MG tablet Take 20 mg by mouth daily.    . furosemide (LASIX) 20 MG tablet Take 1 tablet (20 mg total) by mouth daily. 30 tablet 12  . levothyroxine (SYNTHROID, LEVOTHROID) 100 MCG tablet Take 100 mcg by mouth daily before breakfast.     . loratadine (CLARITIN) 10 MG tablet Take 10 mg by mouth daily as needed for allergies.     Marland Kitchen  losartan (COZAAR) 50 MG tablet TAKE ONE TABLET BY MOUTH DAILY 30 tablet 0  . metoprolol tartrate (LOPRESSOR) 50 MG tablet Take 75 mg by mouth 2 (two) times daily.    . Multiple Vitamin (MULTIVITAMIN WITH MINERALS) TABS tablet Take 1 tablet by mouth daily.    Vladimir Faster Glycol-Propyl Glycol (SYSTANE OP) Place 1 drop into both eyes daily as needed (dry eyes).    . potassium chloride (K-DUR,KLOR-CON) 10 MEQ tablet Take 10 mEq by mouth daily.    . rosuvastatin (CRESTOR) 20 MG tablet Take 20 mg by mouth daily.     No facility-administered medications prior to  visit.         Objective:   Physical Exam Vitals:   06/25/18 1330  BP: 136/86  Pulse: (!) 110  SpO2: 97%  Weight: 208 lb (94.3 kg)  Height: 5\' 3"  (1.6 m)   Gen: Pleasant, well-nourished, in no distress,  normal affect  ENT: No lesions,  mouth clear,  oropharynx clear, no postnasal drip  Neck: No JVD, no stridor  Lungs: No use of accessory muscles, no crackles or wheezes.   Cardiovascular: RRR, heart sounds normal, no murmur or gallops, no peripheral edema  Musculoskeletal: No deformities, no cyanosis or clubbing  Neuro: alert, non focal  Skin: Warm, no lesions or rash     Assessment & Plan:  Dyspnea on exertion Multifactorial but certainly a significant component of obstructive lung disease present based on today's pulmonary function testing.  Her FEV1 is 48% predicted with a positive bronchodilator response.  COPD with asthma ([place]) Clear obstruction with a positive bronchodilator response noted on today's coronary function testing.  She remains limited with regard to exertion.  I think we should do a trial of Stiolto to see if she benefits.  Given her decreased diffusion capacity we will also check a walking oximetry today.  We will do a trial of Stiolto 2 puffs once daily.  Please call us to let us know if this medication helps you.  If so then we will order it through your pharmacy and continue. Walking oximetry  today on room air. Follow with Dr Lamonte Sakai in 6 months or sooner if you have any problems  Sleep apnea Continue CPAP every night  Baltazar Apo, MD, PhD 06/25/2018, 1:47 PM [facility] [phone] or if no answer [phone]

## 2018-06-25 NOTE — Assessment & Plan Note (Signed)
Multifactorial but certainly a significant component of obstructive lung disease present based on today's pulmonary function testing.  Her FEV1 is 48% predicted with a positive bronchodilator response.

## 2018-06-26 DIAGNOSIS — H353132 Nonexudative age-related macular degeneration, bilateral, intermediate dry stage: Secondary | ICD-10-CM | POA: Diagnosis not present

## 2018-06-26 DIAGNOSIS — Z961 Presence of intraocular lens: Secondary | ICD-10-CM | POA: Diagnosis not present

## 2018-06-26 DIAGNOSIS — H5111 Convergence insufficiency: Secondary | ICD-10-CM | POA: Diagnosis not present

## 2018-06-26 DIAGNOSIS — H1852 Epithelial (juvenile) corneal dystrophy: Secondary | ICD-10-CM | POA: Diagnosis not present

## 2018-07-07 DIAGNOSIS — H5010 Unspecified exotropia: Secondary | ICD-10-CM | POA: Diagnosis not present

## 2018-07-07 DIAGNOSIS — H04123 Dry eye syndrome of bilateral lacrimal glands: Secondary | ICD-10-CM | POA: Diagnosis not present

## 2018-08-06 ENCOUNTER — Telehealth: Payer: Self-pay | Admitting: Cardiology

## 2018-08-06 NOTE — Telephone Encounter (Signed)
Wrong provider on encounter

## 2018-08-06 NOTE — Telephone Encounter (Signed)
Spoke to patient she stated she already spoke to someone about having a fast heart beat.Stated her B/P today 124/64.Heart beat ranging 130 to 140.After reviewing chart appointment scheduled with Roderic Palau NP 08/07/18 at 11:00 [place].

## 2018-08-06 NOTE — Telephone Encounter (Signed)
Apt tomorrow 12/19 at 11 am, I had to leave message for her, parking garage code is [id number]

## 2018-08-06 NOTE — Telephone Encounter (Signed)
Patient called stating she had returned from [place] and for nearly 2 weeks has had elevated heart rates 130-140's . She has a watch monitor that tells her she is in A-fib.She has not taken her BP, although she is going to look for BP machine in her home.   She found BP machine, but doesn't know how to use it.   She made f/u apt for Monday, 07/31/18 , but DOD, Dr.Hochrein feels she needs sooner apt and suggested A-fib clinic.I had to leave message for them to call me back

## 2018-08-07 ENCOUNTER — Ambulatory Visit (HOSPITAL_COMMUNITY)
Admission: RE | Admit: 2018-08-07 | Discharge: 2018-08-07 | Disposition: A | Discharge status: Home / Self Care | Payer: Medicare HMO | Source: Ambulatory Visit | Attending: Nurse Practitioner | Admitting: Nurse Practitioner

## 2018-08-07 ENCOUNTER — Encounter (HOSPITAL_COMMUNITY): Payer: Self-pay | Admitting: Nurse Practitioner

## 2018-08-07 ENCOUNTER — Telehealth: Payer: Self-pay

## 2018-08-07 VITALS — BP 132/70 | HR 70 | Ht 63.0 in | Wt 209.4 lb

## 2018-08-07 DIAGNOSIS — Z7901 Long term (current) use of anticoagulants: Secondary | ICD-10-CM | POA: Insufficient documentation

## 2018-08-07 DIAGNOSIS — Z79899 Other long term (current) drug therapy: Secondary | ICD-10-CM | POA: Insufficient documentation

## 2018-08-07 DIAGNOSIS — E669 Obesity, unspecified: Secondary | ICD-10-CM | POA: Insufficient documentation

## 2018-08-07 DIAGNOSIS — I4819 Other persistent atrial fibrillation: Secondary | ICD-10-CM

## 2018-08-07 DIAGNOSIS — Z79890 Hormone replacement therapy (postmenopausal): Secondary | ICD-10-CM | POA: Diagnosis not present

## 2018-08-07 DIAGNOSIS — E079 Disorder of thyroid, unspecified: Secondary | ICD-10-CM | POA: Diagnosis not present

## 2018-08-07 DIAGNOSIS — Z87891 Personal history of nicotine dependence: Secondary | ICD-10-CM | POA: Diagnosis not present

## 2018-08-07 DIAGNOSIS — E039 Hypothyroidism, unspecified: Secondary | ICD-10-CM | POA: Insufficient documentation

## 2018-08-07 DIAGNOSIS — I480 Paroxysmal atrial fibrillation: Secondary | ICD-10-CM

## 2018-08-07 DIAGNOSIS — I110 Hypertensive heart disease with heart failure: Secondary | ICD-10-CM | POA: Diagnosis not present

## 2018-08-07 DIAGNOSIS — D6851 Activated protein C resistance: Secondary | ICD-10-CM | POA: Insufficient documentation

## 2018-08-07 DIAGNOSIS — I495 Sick sinus syndrome: Secondary | ICD-10-CM | POA: Insufficient documentation

## 2018-08-07 DIAGNOSIS — Z950 Presence of cardiac pacemaker: Secondary | ICD-10-CM | POA: Insufficient documentation

## 2018-08-07 DIAGNOSIS — I509 Heart failure, unspecified: Secondary | ICD-10-CM | POA: Diagnosis not present

## 2018-08-07 DIAGNOSIS — Z6837 Body mass index (BMI) 37.0-37.9, adult: Secondary | ICD-10-CM | POA: Diagnosis not present

## 2018-08-07 DIAGNOSIS — E785 Hyperlipidemia, unspecified: Secondary | ICD-10-CM | POA: Insufficient documentation

## 2018-08-07 NOTE — Telephone Encounter (Signed)
Left a VM for Ms. Mac asking her to call back [facility]

## 2018-08-07 NOTE — Progress Notes (Signed)
Primary Care Physician: Aretta Nip, MD Referring Physician:  Kerin Ransom, PA to establish wit [name]  Prior EP: Dr. Glennon Mac at [facility]/Dr. Reymundo Poll   Carly Romero is a 72 y.o. female with a complex h/o of afib. Her EP management has been at [facility] with 2 previous ablations 5/222016 and 1/22017. She has also used amiodarone in the past with significant hair loss so this was stopped. Failed Tikosyn  to keep her in rhythm but this was tried before any ablations. She is transitioning  to [name] from  Dr. Wynonia Lawman. After she was seen by Carly Romero, she has gone into symptomatic  persistent afib for the last 8 days. She was referred to the Afib Clinc for further evaluation. She reports missing a dose of eliquis last week. Chadsvasc score of 3. H/o of longstanding anticoagulation use due to Factor V Leiden. Plans were for TEE guided DCCV. BB was increased for RVR but reduced back to usual dose am of cardioversion. She presented in SR to endo unit with HR in the 40's and lethargy. Admitted with tachy/brady and received PPM.  She is now being seen back in afib clinic as she has had afib for the last 2 weeks, she had been traveling during this period of time.  She talked to [facility] triage yesterday and had taken an extra metoprolol and went back into SR within a few hours. She did not try any extra metoprolol over the last 2 weeks.  She continues in SR today.  Today, she denies symptoms of palpitations, chest pain, shortness of breath, orthopnea, PND, lower extremity edema, dizziness, presyncope, syncope, or neurologic sequela. t for fatigue and exertional dyspnea. The patient is tolerating medications without difficulties and is otherwise without complaint today.   Past Medical History:  Diagnosis Date  . Atrial fibrillation ([place])   . Factor V Leiden ([place])   . Hyperlipidemia   . Hypertension   . Hypertensive heart disease   . Hypothyroidism   . Obesity (BMI 30-39.9)   . Thyroid disease    Past  Surgical History:  Procedure Laterality Date  . ATRIAL FIBRILLATION ABLATION  12/2014   Dr Glennon Mac at [facility]  . ATRIAL FIBRILLATION ABLATION  08/2015   Dr Glennon Mac at [facility]  . CARDIOVERSION N/A 05/26/2014   Procedure: CARDIOVERSION;  Surgeon: Jacolyn Reedy, MD;  Location: [facility] ENDOSCOPY;  Service: Cardiovascular;  Laterality: N/A;  . CARDIOVERSION N/A 09/14/2014   Procedure: CARDIOVERSION;  Surgeon: Jacolyn Reedy, MD;  Location: [facility] ENDOSCOPY;  Service: Cardiovascular;  Laterality: N/A;  . CARDIOVERSION N/A 05/11/2015   Procedure: CARDIOVERSION;  Surgeon: Jacolyn Reedy, MD;  Location: [place];  Service: Cardiovascular;  Laterality: N/A;  . COSMETIC SURGERY     lower lids  . DENTAL SURGERY     wisdom teeth  . PACEMAKER IMPLANT N/A 02/26/2018   Procedure: PACEMAKER IMPLANT;  Surgeon: Evans Lance, MD;  Location: [place] CV LAB;  Service: Cardiovascular;  Laterality: N/A;    Current Outpatient Medications  Medication Sig Dispense Refill  . BIOTIN PO Take 1 tablet by mouth daily.    Marland Kitchen ELIQUIS 5 MG TABS tablet Take 1 tablet (5 mg total) by mouth 2 (two) times daily. Resume 03/02/18 am    . FLUoxetine (PROZAC) 20 MG tablet Take 20 mg by mouth daily.    . furosemide (LASIX) 20 MG tablet Take 1 tablet (20 mg total) by mouth daily. 30 tablet 12  . levothyroxine (SYNTHROID, LEVOTHROID) 100 MCG  tablet Take 100 mcg by mouth daily before breakfast.     . loratadine (CLARITIN) 10 MG tablet Take 10 mg by mouth daily as needed for allergies.     Marland Kitchen losartan (COZAAR) 50 MG tablet TAKE ONE TABLET BY MOUTH DAILY 30 tablet 0  . metoprolol tartrate (LOPRESSOR) 50 MG tablet Take 75 mg by mouth 2 (two) times daily.    . Multiple Vitamin (MULTIVITAMIN WITH MINERALS) TABS tablet Take 1 tablet by mouth daily.    . potassium chloride (K-DUR,KLOR-CON) 10 MEQ tablet Take 10 mEq by mouth daily.    . rosuvastatin (CRESTOR) 20 MG tablet Take 20 mg by mouth daily.    . Tiotropium Bromide-Olodaterol (STIOLTO  RESPIMAT) 2.5-2.5 MCG/ACT AERS Inhale 2 puffs into the lungs daily. 2 Inhaler 0  . Calcium Carbonate-Vitamin D (CALCIUM + D PO) Take 1 tablet by mouth daily.    Vladimir Faster Glycol-Propyl Glycol (SYSTANE OP) Place 1 drop into both eyes daily as needed (dry eyes).     No current facility-administered medications for this encounter.     Allergies  Allergen Reactions  . Amiodarone Other (See Comments)    alopecia  . Chantix [Varenicline]     Suicidal thoughts  . Codeine Swelling    Swelling as a young adult  . Lipitor [Atorvastatin] Diarrhea    And cramping.    Social History   Socioeconomic History  . Marital status: Single    Spouse name: Not on file  . Number of children: Not on file  . Years of education: Not on file  . Highest education level: Not on file  Occupational History  . Not on file  Social Needs  . Financial resource strain: Not on file  . Food insecurity:    Worry: Not on file    Inability: Not on file  . Transportation needs:    Medical: Not on file    Non-medical: Not on file  Tobacco Use  . Smoking status: Former Smoker    Packs/day: 1.00    Years: 40.00    Pack years: 40.00    Last attempt to quit: 12/11/2013    Years since quitting: 4.6  . Smokeless tobacco: Never Used  Substance and Sexual Activity  . Alcohol use: Yes    Alcohol/week: 8.0 standard drinks    Types: 8 Glasses of wine per week  . Drug use: No  . Sexual activity: Never  Lifestyle  . Physical activity:    Days per week: Not on file    Minutes per session: Not on file  . Stress: Not on file  Relationships  . Social connections:    Talks on phone: Not on file    Gets together: Not on file    Attends religious service: Not on file    Active member of club or organization: Not on file    Attends meetings of clubs or organizations: Not on file    Relationship status: Not on file  . Intimate partner violence:    Fear of current or ex partner: Not on file    Emotionally abused:  Not on file    Physically abused: Not on file    Forced sexual activity: Not on file  Other Topics Concern  . Not on file  Social History Narrative  . Not on file    Family History  Problem Relation Age of Onset  . Diabetes Brother     ROS- All systems are reviewed and negative except as per the  HPI above  Physical Exam: Vitals:   08/07/18 0958  BP: 132/70  Pulse: 70  Weight: 95 kg  Height: 5\' 3"  (1.6 m)   Wt Readings from Last 3 Encounters:  08/07/18 95 kg  06/25/18 94.3 kg  06/18/18 94.3 kg    Labs: Lab Results  Component Value Date   NA 144 06/18/2018   K 4.1 06/18/2018   CL 106 06/18/2018   CO2 23 06/18/2018   GLUCOSE 99 06/18/2018   BUN 25 06/18/2018   CREATININE 1.07 (H) 06/18/2018   CALCIUM 9.0 06/18/2018   MG 2.0 02/25/2018   Lab Results  Component Value Date   INR 1.37 02/25/2018   Lab Results  Component Value Date   CHOL 208 (H) 01/14/2018   HDL 73 01/14/2018   LDLCALC 102 (H) 01/14/2018   TRIG 164 (H) 01/14/2018     GEN- The patient is well appearing, alert and oriented x 3 today.   Head- normocephalic, atraumatic Eyes-  Sclera clear, conjunctiva pink Ears- hearing intact Oropharynx- clear Neck- supple, no JVP Lymph- no cervical lymphadenopathy Lungs- Clear to ausculation bilaterally, normal work of breathing Heart- irregular rate and rhythm, no murmurs, rubs or gallops, PMI not laterally displaced GI- soft, NT, ND, + BS Extremities- no clubbing, cyanosis, or edema MS- no significant deformity or atrophy Skin- no rash or lesion Psych- euthymic mood, full affect Neuro- strength and sensation are intact  EKG- afib at 96 bpm, qrs int 86 ms, qtc 469 ms Echo-12/02/16- per Dr. Wynonia Lawman- Showed  Moderate concentric LVH, moderate Left atrial enlargement , grade 1 aortic regurgitation, trace MR, trace TR    Assessment and Plan: 1. Persistent afib with complex afib history Prior amiodarone/tikosyn use Out of rhythm x 2 weeks, converted  with an extra 50 mg metoprolol yesterday May have been precipitated by travel for the last 2 weeks Discussed how to use prn metoprolol for afib Continue 75 mg bid of metoprolol daily Continue eliquis 5 mg bid for chadvasc score of at least 3   2. Tachy/brady syndrome S/p PPM Per Dr. Lovena Le  3. BMI 37.09 Pt is interested in the weight management class and [facility] free  3 month exercsie program and will be referred  F/u with [name] 3/16 afib clinic as needed  Butch Penny C. Letzy Gullickson, [facility] [address] [place], [place] [address] [phone]

## 2018-08-10 ENCOUNTER — Ambulatory Visit: Payer: Medicare HMO | Admitting: Physician Assistant

## 2018-08-17 ENCOUNTER — Telehealth: Payer: Self-pay

## 2018-08-17 NOTE — Telephone Encounter (Signed)
Left a VM on Ms. Null's cell regarding PREP at the [facility] and for her to call back when she can.

## 2018-08-27 ENCOUNTER — Encounter (HOSPITAL_COMMUNITY): Payer: Self-pay | Admitting: Nurse Practitioner

## 2018-08-27 ENCOUNTER — Ambulatory Visit (HOSPITAL_COMMUNITY)
Admission: RE | Admit: 2018-08-27 | Discharge: 2018-08-27 | Disposition: A | Discharge status: Home / Self Care | Payer: Medicare HMO | Source: Ambulatory Visit | Attending: Nurse Practitioner | Admitting: Nurse Practitioner

## 2018-08-27 VITALS — BP 154/88 | HR 96 | Ht 63.0 in | Wt 209.0 lb

## 2018-08-27 DIAGNOSIS — I119 Hypertensive heart disease without heart failure: Secondary | ICD-10-CM | POA: Insufficient documentation

## 2018-08-27 DIAGNOSIS — Z950 Presence of cardiac pacemaker: Secondary | ICD-10-CM | POA: Insufficient documentation

## 2018-08-27 DIAGNOSIS — Z87891 Personal history of nicotine dependence: Secondary | ICD-10-CM | POA: Insufficient documentation

## 2018-08-27 DIAGNOSIS — I495 Sick sinus syndrome: Secondary | ICD-10-CM | POA: Diagnosis not present

## 2018-08-27 DIAGNOSIS — Z79899 Other long term (current) drug therapy: Secondary | ICD-10-CM | POA: Diagnosis not present

## 2018-08-27 DIAGNOSIS — E039 Hypothyroidism, unspecified: Secondary | ICD-10-CM | POA: Diagnosis not present

## 2018-08-27 DIAGNOSIS — E785 Hyperlipidemia, unspecified: Secondary | ICD-10-CM | POA: Insufficient documentation

## 2018-08-27 DIAGNOSIS — Z833 Family history of diabetes mellitus: Secondary | ICD-10-CM | POA: Diagnosis not present

## 2018-08-27 DIAGNOSIS — D6851 Activated protein C resistance: Secondary | ICD-10-CM | POA: Insufficient documentation

## 2018-08-27 DIAGNOSIS — Z79890 Hormone replacement therapy (postmenopausal): Secondary | ICD-10-CM | POA: Insufficient documentation

## 2018-08-27 DIAGNOSIS — E669 Obesity, unspecified: Secondary | ICD-10-CM | POA: Insufficient documentation

## 2018-08-27 DIAGNOSIS — Z6837 Body mass index (BMI) 37.0-37.9, adult: Secondary | ICD-10-CM | POA: Insufficient documentation

## 2018-08-27 DIAGNOSIS — Z885 Allergy status to narcotic agent status: Secondary | ICD-10-CM | POA: Diagnosis not present

## 2018-08-27 DIAGNOSIS — I4819 Other persistent atrial fibrillation: Secondary | ICD-10-CM

## 2018-08-27 DIAGNOSIS — Z888 Allergy status to other drugs, medicaments and biological substances status: Secondary | ICD-10-CM | POA: Insufficient documentation

## 2018-08-27 DIAGNOSIS — I4891 Unspecified atrial fibrillation: Secondary | ICD-10-CM | POA: Diagnosis present

## 2018-08-27 DIAGNOSIS — Z7901 Long term (current) use of anticoagulants: Secondary | ICD-10-CM | POA: Diagnosis not present

## 2018-08-27 NOTE — Progress Notes (Signed)
Primary Care Physician: Aretta Nip, MD Referring Physician:  Kerin Ransom, PA to establish wit [name]  Prior EP: Dr. Glennon Mac at [facility]/Dr. Reymundo Poll   Carly Romero is a 73 y.o. female with a complex h/o of afib. Her EP management has been at [facility] with 2 previous ablations 5/222016 and 1/22017. She has also used amiodarone in the past with significant hair loss so this was stopped. Failed Tikosyn  to keep her in rhythm but this was tried before any ablations. She is transitioning  to [name] from  Dr. Wynonia Lawman. After she was seen by Lurena Joiner, she has gone into symptomatic  persistent afib for the last 8 days. She was referred to the Afib Clinc for further evaluation. She reports missing a dose of eliquis last week. Chadsvasc score of 3. H/o of longstanding anticoagulation use due to Factor V Leiden. Plans were for TEE guided DCCV. BB was increased for RVR but reduced back to usual dose am of cardioversion. She presented in SR to endo unit with HR in the 40's and lethargy. Admitted with tachy/brady and received PPM.  She is now being seen back in afib clinic as she has had afib for the last 2 weeks, she had been traveling during this period of time.  She talked to [facility] triage yesterday and had taken an extra metoprolol and went back into SR within a few hours. She did not try any extra metoprolol over the last 2 weeks.  She continues in SR today.  F/u in afib clinic 1/9 as she called to the office and reported that she has been in [place] since Christmas when she was visiting with her sister. She has been taking as extra 50 mg metoprolol tartrate mid day, in addition to her 75 mg bid dose. This has failed to convert pt. She has chronic exertional dyspnea but fel it is some worse in afib but overall appears to tolerate.  Today, she denies symptoms of palpitations, chest pain, shortness of breath, orthopnea, PND, lower extremity edema, dizziness, presyncope, syncope, or neurologic sequela. t for  fatigue and exertional dyspnea. The patient is tolerating medications without difficulties and is otherwise without complaint today.   Past Medical History:  Diagnosis Date  . Atrial fibrillation ([place])   . Factor V Leiden ([place])   . Hyperlipidemia   . Hypertension   . Hypertensive heart disease   . Hypothyroidism   . Obesity (BMI 30-39.9)   . Thyroid disease    Past Surgical History:  Procedure Laterality Date  . ATRIAL FIBRILLATION ABLATION  12/2014   Dr Glennon Mac at [facility]  . ATRIAL FIBRILLATION ABLATION  08/2015   Dr Glennon Mac at [facility]  . CARDIOVERSION N/A 05/26/2014   Procedure: CARDIOVERSION;  Surgeon: Jacolyn Reedy, MD;  Location: [facility] ENDOSCOPY;  Service: Cardiovascular;  Laterality: N/A;  . CARDIOVERSION N/A 09/14/2014   Procedure: CARDIOVERSION;  Surgeon: Jacolyn Reedy, MD;  Location: [facility] ENDOSCOPY;  Service: Cardiovascular;  Laterality: N/A;  . CARDIOVERSION N/A 05/11/2015   Procedure: CARDIOVERSION;  Surgeon: Jacolyn Reedy, MD;  Location: [place];  Service: Cardiovascular;  Laterality: N/A;  . COSMETIC SURGERY     lower lids  . DENTAL SURGERY     wisdom teeth  . PACEMAKER IMPLANT N/A 02/26/2018   Procedure: PACEMAKER IMPLANT;  Surgeon: Evans Lance, MD;  Location: [place] CV LAB;  Service: Cardiovascular;  Laterality: N/A;    Current Outpatient Medications  Medication Sig Dispense Refill  . BIOTIN PO  Take 1 tablet by mouth daily.    . Calcium Carbonate-Vitamin D (CALCIUM + D PO) Take 1 tablet by mouth daily.    Marland Kitchen ELIQUIS 5 MG TABS tablet Take 1 tablet (5 mg total) by mouth 2 (two) times daily. Resume 03/02/18 am    . FLUoxetine (PROZAC) 20 MG tablet Take 20 mg by mouth daily.    . furosemide (LASIX) 20 MG tablet Take 1 tablet (20 mg total) by mouth daily. 30 tablet 12  . levothyroxine (SYNTHROID, LEVOTHROID) 100 MCG tablet Take 100 mcg by mouth daily before breakfast.     . loratadine (CLARITIN) 10 MG tablet Take 10 mg by mouth daily as needed for allergies.      Marland Kitchen losartan (COZAAR) 50 MG tablet TAKE ONE TABLET BY MOUTH DAILY 30 tablet 0  . metoprolol tartrate (LOPRESSOR) 50 MG tablet Take 75 mg by mouth 2 (two) times daily.    . Multiple Vitamin (MULTIVITAMIN WITH MINERALS) TABS tablet Take 1 tablet by mouth daily.    Vladimir Faster Glycol-Propyl Glycol (SYSTANE OP) Place 1 drop into both eyes daily as needed (dry eyes).    . potassium chloride (K-DUR,KLOR-CON) 10 MEQ tablet Take 10 mEq by mouth daily.    . rosuvastatin (CRESTOR) 20 MG tablet Take 20 mg by mouth daily.    . Tiotropium Bromide-Olodaterol (STIOLTO RESPIMAT) 2.5-2.5 MCG/ACT AERS Inhale 2 puffs into the lungs daily. 2 Inhaler 0   No current facility-administered medications for this encounter.     Allergies  Allergen Reactions  . Amiodarone Other (See Comments)    alopecia  . Chantix [Varenicline]     Suicidal thoughts  . Codeine Swelling    Swelling as a young adult  . Lipitor [Atorvastatin] Diarrhea    And cramping.    Social History   Socioeconomic History  . Marital status: Single    Spouse name: Not on file  . Number of children: Not on file  . Years of education: Not on file  . Highest education level: Not on file  Occupational History  . Not on file  Social Needs  . Financial resource strain: Not on file  . Food insecurity:    Worry: Not on file    Inability: Not on file  . Transportation needs:    Medical: Not on file    Non-medical: Not on file  Tobacco Use  . Smoking status: Former Smoker    Packs/day: 1.00    Years: 40.00    Pack years: 40.00    Last attempt to quit: 12/11/2013    Years since quitting: 4.7  . Smokeless tobacco: Never Used  Substance and Sexual Activity  . Alcohol use: Yes    Alcohol/week: 8.0 standard drinks    Types: 8 Glasses of wine per week  . Drug use: No  . Sexual activity: Never  Lifestyle  . Physical activity:    Days per week: Not on file    Minutes per session: Not on file  . Stress: Not on file  Relationships  .  Social connections:    Talks on phone: Not on file    Gets together: Not on file    Attends religious service: Not on file    Active member of club or organization: Not on file    Attends meetings of clubs or organizations: Not on file    Relationship status: Not on file  . Intimate partner violence:    Fear of current or ex partner: Not on file  Emotionally abused: Not on file    Physically abused: Not on file    Forced sexual activity: Not on file  Other Topics Concern  . Not on file  Social History Narrative  . Not on file    Family History  Problem Relation Age of Onset  . Diabetes Brother     ROS- All systems are reviewed and negative except as per the HPI above  Physical Exam: Vitals:   08/27/18 0858  BP: (!) 154/88  Pulse: 96  SpO2: 96%  Weight: 94.8 kg  Height: 5\' 3"  (1.6 m)   Wt Readings from Last 3 Encounters:  08/27/18 94.8 kg  08/07/18 95 kg  06/25/18 94.3 kg    Labs: Lab Results  Component Value Date   NA 144 06/18/2018   K 4.1 06/18/2018   CL 106 06/18/2018   CO2 23 06/18/2018   GLUCOSE 99 06/18/2018   BUN 25 06/18/2018   CREATININE 1.07 (H) 06/18/2018   CALCIUM 9.0 06/18/2018   MG 2.0 02/25/2018   Lab Results  Component Value Date   INR 1.37 02/25/2018   Lab Results  Component Value Date   CHOL 208 (H) 01/14/2018   HDL 73 01/14/2018   LDLCALC 102 (H) 01/14/2018   TRIG 164 (H) 01/14/2018     GEN- The patient is well appearing, alert and oriented x 3 today.   Head- normocephalic, atraumatic Eyes-  Sclera clear, conjunctiva pink Ears- hearing intact Oropharynx- clear Neck- supple, no JVP Lymph- no cervical lymphadenopathy Lungs- Clear to ausculation bilaterally, normal work of breathing Heart- irregular rate and rhythm, no murmurs, rubs or gallops, PMI not laterally displaced GI- soft, NT, ND, + BS Extremities- no clubbing, cyanosis, or edema MS- no significant deformity or atrophy Skin- no rash or lesion Psych- euthymic  mood, full affect Neuro- strength and sensation are intact  EKG- afib at 96 bpm, qrs int 84 ms, qtc 480 ms Echo-02/2018-ventricle: The cavity size was normal. There was severe   concentric hypertrophy. Systolic function was normal. The   estimated ejection fraction was in the range of 50% to 55%. Wall   motion was normal; there were no regional wall motion   abnormalities. Doppler parameters are consistent with a   reversible restrictive pattern, indicative of decreased left   ventricular diastolic compliance and/or increased left atrial   pressure (grade 3 diastolic dysfunction). - Aortic valve: There was mild to moderate stenosis. There was mild   regurgitation. Mean gradient (S): 9 mm Hg. Peak gradient (S): 21   mm Hg. Valve area (VTI): 1.44 cm^2. - Aorta: The aorta was mildly dilated. Ascending aortic diameter:   36 mm (S). - Mitral valve: Calcified annulus. - Left atrium: The atrium was moderately to severely dilated.   Volume, S: 102 ml. Volume/bsa, S: 48.4 ml/m^2.  Impressions:  - Diastolic dysfunction, severe LVH, calcified aortic and mitral   valves with stenosis/regurgitation as noted, severely dilated   left atrium. 35 mm but with volume of 102 ml    Assessment and Plan: 1. Persistent afib with complex afib history Increased afib burden over the last 6 months, persistent x 2-3 weeks Prior amiodarone contributed to alopecia("rather have afib than go bald") tikosyn failed to get pt back in rhythm but was tried prior to ablations  I discussed with pt options,  live in rate controlled afib, return to [facility] for consideration of another ablation, try a cardioversion(pt states does not usually hold her in SR) or another trial of tikosyn  Pt is leaning toward living in afib  I discussed with Dr. Rayann Heman to see if he would do another ablation, if pt would consent.  He  feels she needs to f/u with Dr. Glennon Mac, [place], at [facility] to further discuss options, since he has done the 2 prior  ablations Continue 75 mg bid of metoprolol daily, with an extra 50 mg mid day( she made this change), she is tolerating this and is rate controlled Continue eliquis 5 mg bid for chadvasc score of at least 3  2. Tachy/brady syndrome S/p PPM Per Dr. Lovena Le  3. BMI 37.09  pt was referred to the [facility] exercise program but does not feel she is in shape to do this now)  F/u with [name] 3/16, remote check 1/30 afib clinic as needed  Butch Penny C. Bart Ashford, [facility] [address] [place], [place] [address] [phone]

## 2018-08-31 ENCOUNTER — Other Ambulatory Visit: Payer: Self-pay | Admitting: Cardiology

## 2018-09-14 ENCOUNTER — Other Ambulatory Visit (HOSPITAL_COMMUNITY): Payer: Self-pay | Admitting: *Deleted

## 2018-09-14 ENCOUNTER — Telehealth: Payer: Self-pay | Admitting: Cardiology

## 2018-09-14 MED ORDER — ELIQUIS 5 MG PO TABS: 5.0000 mg | ORAL_TABLET | Freq: Two times a day (BID) | ORAL | 2 refills | Status: DC

## 2018-09-14 NOTE — Telephone Encounter (Signed)
°  1. Which medications need to be refilled? (please list name of each medication and dose if known) Eloquis 5mg  tablet  2. Which pharmacy/location (including street and city if local pharmacy) is medication to be sent to?[facility]  3. Do they need a 30 day or 90 day supply? [place]

## 2018-09-14 NOTE — Telephone Encounter (Signed)
Routed to afib clinic

## 2018-10-14 DIAGNOSIS — E039 Hypothyroidism, unspecified: Secondary | ICD-10-CM | POA: Diagnosis not present

## 2018-10-14 DIAGNOSIS — E7800 Pure hypercholesterolemia, unspecified: Secondary | ICD-10-CM | POA: Diagnosis not present

## 2018-10-14 DIAGNOSIS — I4891 Unspecified atrial fibrillation: Secondary | ICD-10-CM | POA: Diagnosis not present

## 2018-10-19 DIAGNOSIS — Z950 Presence of cardiac pacemaker: Secondary | ICD-10-CM | POA: Diagnosis not present

## 2018-10-19 DIAGNOSIS — E7800 Pure hypercholesterolemia, unspecified: Secondary | ICD-10-CM | POA: Diagnosis not present

## 2018-10-19 DIAGNOSIS — Z0000 Encounter for general adult medical examination without abnormal findings: Secondary | ICD-10-CM | POA: Diagnosis not present

## 2018-10-19 DIAGNOSIS — I10 Essential (primary) hypertension: Secondary | ICD-10-CM | POA: Diagnosis not present

## 2018-10-19 DIAGNOSIS — Z1159 Encounter for screening for other viral diseases: Secondary | ICD-10-CM | POA: Diagnosis not present

## 2018-10-19 DIAGNOSIS — E039 Hypothyroidism, unspecified: Secondary | ICD-10-CM | POA: Diagnosis not present

## 2018-10-19 DIAGNOSIS — D6851 Activated protein C resistance: Secondary | ICD-10-CM | POA: Diagnosis not present

## 2018-10-19 DIAGNOSIS — I4891 Unspecified atrial fibrillation: Secondary | ICD-10-CM | POA: Diagnosis not present

## 2018-10-19 DIAGNOSIS — Z7901 Long term (current) use of anticoagulants: Secondary | ICD-10-CM | POA: Diagnosis not present

## 2018-10-31 NOTE — Progress Notes (Signed)
11/02/2018 Carly Romero   [birth year]  [id number]  Primary Physician Rankins, Bill Salinas, MD Primary Cardiologist: initial visit.  EP Carly Romero- [facility], Carly Romero.  HPI:  This is my first visit with Carly Romero. She was initially seen by Carly Ransom PA-C in May 2019. She is a pleasant 73 y/o female with a history of PAF dating back to 76. She had been followed by Carly Romero as her general cardiologist but she has requested transfer of her care to our group. She had RFA in May 2016 and again in Jan 2017 by Carly Romero at [facility]. She has failed Tikosyn and has been intolerant to Amiodarone (alopecia).  She is s/p pacemaker implant and is followed by Carly. Lovena Romero.  She has no history of coronary artery disease and has never had a cath but had a stress test remotely.   On her last visit she complained of chronic dyspnea on exertion. She states she cannot do anything without giving out of breath. She was evaluated by Carly Romero with pulmonary. PFTs c/w severe COPD. Inhalers started. She did appear to have a significant bronchodilator response.  Developed AFib in December. Seen initially in the  AFib clinic and had converted to NSR. Seen a couple weeks later and Afib had returned and was persistent. Had failed Tikosyn before. Poor candidate for amiodarone with lung disease. Consideration was to live in AFib versus seeing Carly. Glennon Romero at [facility] to see if another ablation is an option.   She does use CPAP managed by Carly. Maxwell Romero. She has long prior history of tobacco abuse.  She has a long history of DVTs with Factor V Leiden deficiency. Has been on anticoagulation since she was 73 years old.   On follow up today she notes continued difficulty with SOB. No cough. Her understanding of her pulmonary evaluation was that she had mild asthma. Did not understand that she had severe COPD. Has not been using her inhaler therapy regularly. Notes HR is persistently elevated in 110 range. No chest pain or edema.     Current Outpatient Medications  Medication Sig Dispense Refill  . BIOTIN PO Take 1 tablet by mouth daily.    . Calcium Carbonate-Vitamin D (CALCIUM + D PO) Take 1 tablet by mouth daily.    Marland Kitchen ELIQUIS 5 MG TABS tablet Take 1 tablet (5 mg total) by mouth 2 (two) times daily. 180 tablet 2  . FLUoxetine (PROZAC) 20 MG tablet Take 20 mg by mouth daily.    . furosemide (LASIX) 20 MG tablet Take 1 tablet (20 mg total) by mouth daily. 30 tablet 12  . levothyroxine (SYNTHROID, LEVOTHROID) 100 MCG tablet Take 100 mcg by mouth daily before breakfast.     . loratadine (CLARITIN) 10 MG tablet Take 10 mg by mouth daily as needed for allergies.     Marland Kitchen losartan (COZAAR) 50 MG tablet TAKE ONE TABLET BY MOUTH DAILY 30 tablet 8  . metoprolol tartrate (LOPRESSOR) 50 MG tablet Take 50 mg by mouth 2 (two) times daily.    . Multiple Vitamin (MULTIVITAMIN WITH MINERALS) TABS tablet Take 1 tablet by mouth daily.    Vladimir Faster Glycol-Propyl Glycol (SYSTANE OP) Place 1 drop into both eyes daily as needed (dry eyes).    . potassium chloride (K-DUR,KLOR-CON) 10 MEQ tablet Take 10 mEq by mouth daily.    . rosuvastatin (CRESTOR) 20 MG tablet Take 20 mg by mouth daily.    . Tiotropium Bromide-Olodaterol (STIOLTO RESPIMAT) 2.5-2.5 MCG/ACT  AERS Inhale 2 puffs into the lungs daily. 2 Inhaler 0  . diltiazem (CARDIZEM CD) 240 MG 24 hr capsule Take 1 capsule (240 mg total) by mouth daily. 30 capsule 6   No current facility-administered medications for this visit.     Allergies  Allergen Reactions  . Amiodarone Other (See Comments)    alopecia  . Chantix [Varenicline]     Suicidal thoughts  . Codeine Swelling    Swelling as a young adult  . Lipitor [Atorvastatin] Diarrhea    And cramping.    Past Medical History:  Diagnosis Date  . Atrial fibrillation ([place])   . Factor V Leiden ([place])   . Hyperlipidemia   . Hypertension   . Hypertensive heart disease   . Hypothyroidism   . Obesity (BMI 30-39.9)   . Thyroid  disease     Social History   Socioeconomic History  . Marital status: Single    Spouse name: Not on file  . Number of children: Not on file  . Years of education: Not on file  . Highest education level: Not on file  Occupational History  . Not on file  Social Needs  . Financial resource strain: Not on file  . Food insecurity:    Worry: Not on file    Inability: Not on file  . Transportation needs:    Medical: Not on file    Non-medical: Not on file  Tobacco Use  . Smoking status: Former Smoker    Packs/day: 1.00    Years: 40.00    Pack years: 40.00    Last attempt to quit: 12/11/2013    Years since quitting: 4.8  . Smokeless tobacco: Never Used  Substance and Sexual Activity  . Alcohol use: Yes    Alcohol/week: 8.0 standard drinks    Types: 8 Glasses of wine per week  . Drug use: No  . Sexual activity: Never  Lifestyle  . Physical activity:    Days per week: Not on file    Minutes per session: Not on file  . Stress: Not on file  Relationships  . Social connections:    Talks on phone: Not on file    Gets together: Not on file    Attends religious service: Not on file    Active member of club or organization: Not on file    Attends meetings of clubs or organizations: Not on file    Relationship status: Not on file  . Intimate partner violence:    Fear of current or ex partner: Not on file    Emotionally abused: Not on file    Physically abused: Not on file    Forced sexual activity: Not on file  Other Topics Concern  . Not on file  Social History Narrative  . Not on file     Family History  Problem Relation Age of Onset  . Diabetes Brother      Review of Systems: As noted in HPI. Otherwise complete ROS is negative.    Blood pressure 132/90, pulse (!) 108, height 5' 5.5" (1.664 m), weight 208 lb (94.3 kg).  GENERAL:  Well appearing obese WF in NAD HEENT:  PERRL, EOMI, sclera are clear. Oropharynx is clear. NECK:  No jugular venous distention,  carotid upstroke brisk and symmetric, no bruits, no thyromegaly or adenopathy LUNGS:  Diminished BS. Clear.  CHEST:  Unremarkable HEART:  IRRR,  PMI not displaced or sustained,S1 and S2 within normal limits, no S3, no S4: no clicks,  no rubs, no murmurs ABD:  Soft, nontender. BS +, no masses or bruits. No hepatomegaly, no splenomegaly EXT:  2 + pulses throughout, no edema, multiple superficial varicosities.  SKIN:  Warm and dry.  No rashes NEURO:  Alert and oriented x 3. Cranial nerves II through XII intact. PSYCH:  Cognitively intact   Lab Results  Component Value Date   WBC 11.2 (H) 05/04/2018   HGB 14.4 05/04/2018   HCT 41.0 05/04/2018   PLT 223 05/04/2018   GLUCOSE 99 06/18/2018   CHOL 208 (H) 01/14/2018   TRIG 164 (H) 01/14/2018   HDL 73 01/14/2018   LDLCALC 102 (H) 01/14/2018   ALT 31 02/25/2018   AST 34 02/25/2018   NA 144 06/18/2018   K 4.1 06/18/2018   CL 106 06/18/2018   CREATININE 1.07 (H) 06/18/2018   BUN 25 06/18/2018   CO2 23 06/18/2018   TSH 2.420 05/04/2018   INR 1.37 02/25/2018   Dated 10/14/18: cholesterol 180, triglycerides 140, HDL 56, LDL 96. Chemistries, CBC, TSH normal  Ecg today shows Afib with rate 108. Nonspecific ST-T changes.   Echo 02/25/18: Study Conclusions  - Left ventricle: The cavity size was normal. There was severe   concentric hypertrophy. Systolic function was normal. The   estimated ejection fraction was in the range of 50% to 55%. Wall   motion was normal; there were no regional wall motion   abnormalities. Doppler parameters are consistent with a   reversible restrictive pattern, indicative of decreased left   ventricular diastolic compliance and/or increased left atrial   pressure (grade 3 diastolic dysfunction). - Aortic valve: There was mild to moderate stenosis. There was mild   regurgitation. Mean gradient (S): 9 mm Hg. Peak gradient (S): 21   mm Hg. Valve area (VTI): 1.44 cm^2. - Aorta: The aorta was mildly dilated.  Ascending aortic diameter:   36 mm (S). - Mitral valve: Calcified annulus. - Left atrium: The atrium was moderately to severely dilated.   Volume, S: 102 ml. Volume/bsa, S: 48.4 ml/m^2.  Impressions:  - Diastolic dysfunction, severe LVH, calcified aortic and mitral   valves with stenosis/regurgitation as noted, severely dilated   left atrium.   ASSESSMENT AND PLAN:  1. Persistent  AFib s/p ablation x 2. Failed therapy with Tikosyn. Intolerant of amiodarone due to alopecia but also should be avoided with underlying lung disease. A third ablation attempt is probably not the best option. She is on chronic anticoagulation. Due to significant bronchospasm component I would like to taper off metoprolol if possible. Will start Cardizem CD 240 mg daily now. Reduce metoprolol to 50 mg bid. She is to call me in 2 weeks with update on her HR. If controlled will reduce metoprolol further and hopefully DC or at least switch if needed to bisoprolol. We will update an Echo.  2. Sinus node dysfunction. S/p pacemaker 3. Severe COPD with bronchospasm. I have recommended she use her inhaler daily as ordered. Will need to follow up with Carly Romero 4. HTN controlled.  5. OSA on CPAP 6. Recurrent DVTs with Factor V Leiden deficiency. On chronic anticoagulation.

## 2018-11-02 ENCOUNTER — Encounter: Payer: Self-pay | Admitting: Cardiology

## 2018-11-02 ENCOUNTER — Ambulatory Visit: Payer: Medicare HMO | Admitting: Cardiology

## 2018-11-02 ENCOUNTER — Other Ambulatory Visit: Payer: Self-pay

## 2018-11-02 VITALS — BP 132/90 | HR 108 | Ht 65.5 in | Wt 208.0 lb

## 2018-11-02 DIAGNOSIS — R0602 Shortness of breath: Secondary | ICD-10-CM | POA: Diagnosis not present

## 2018-11-02 DIAGNOSIS — I10 Essential (primary) hypertension: Secondary | ICD-10-CM | POA: Diagnosis not present

## 2018-11-02 DIAGNOSIS — I482 Chronic atrial fibrillation, unspecified: Secondary | ICD-10-CM | POA: Diagnosis not present

## 2018-11-02 DIAGNOSIS — I4819 Other persistent atrial fibrillation: Secondary | ICD-10-CM | POA: Diagnosis not present

## 2018-11-02 DIAGNOSIS — G4733 Obstructive sleep apnea (adult) (pediatric): Secondary | ICD-10-CM | POA: Diagnosis not present

## 2018-11-02 MED ORDER — DILTIAZEM HCL ER COATED BEADS 240 MG PO CP24: 240.0000 mg | ORAL_CAPSULE | Freq: Every day | ORAL | 6 refills | Status: DC

## 2018-11-02 NOTE — Patient Instructions (Signed)
Reduce metoprolol to 50 mg twice a day  We will add Cardizem CD 240 mg daily  Use your inhaler daily as directed.  We will repeat an Echocardiogram  Report back to me in 2 weeks with your heart rate  Follow up in 3 months

## 2018-11-03 ENCOUNTER — Other Ambulatory Visit: Payer: Self-pay

## 2018-11-03 MED ORDER — TIOTROPIUM BROMIDE-OLODATEROL 2.5-2.5 MCG/ACT IN AERS: 2.0000 | INHALATION_SPRAY | Freq: Every day | RESPIRATORY_TRACT | 1 refills | Status: DC

## 2018-11-11 ENCOUNTER — Other Ambulatory Visit: Payer: Self-pay | Admitting: *Deleted

## 2018-11-12 ENCOUNTER — Telehealth: Payer: Self-pay | Admitting: Emergency Medicine

## 2018-11-12 NOTE — Telephone Encounter (Signed)
Medication name and strength: Stiolto Respimat 2.89mcg Aerosol Provider: RB Pharmacy: [address] Patient insurance ID: [id number]  Was the PA started on CMM?  today If yes, please enter the Key: PA Case: [id number], Status: Approved, Coverage Starts on: 08/17/2018, Coverage Ends on: 08/19/2019. Questions? Contact [phone]. Timeframe for approval/denial: PA is approved

## 2018-11-16 ENCOUNTER — Other Ambulatory Visit: Payer: Self-pay | Admitting: Cardiology

## 2018-11-16 MED ORDER — POTASSIUM CHLORIDE CRYS ER 10 MEQ PO TBCR: 10.0000 meq | EXTENDED_RELEASE_TABLET | Freq: Every day | ORAL | 0 refills | Status: DC

## 2018-11-16 MED ORDER — DILTIAZEM HCL ER COATED BEADS 240 MG PO CP24: 240.0000 mg | ORAL_CAPSULE | Freq: Every day | ORAL | 0 refills | Status: DC

## 2018-11-16 NOTE — Telephone Encounter (Signed)
°*  STAT* If patient is at the pharmacy, call can be transferred to refill team.   1. Which medications need to be refilled? (please list name of each medication and dose if known)  potassium chloride (K-DUR,KLOR-CON) 10 MEQ tablet  diltiazem (CARDIZEM CD) 240 MG 24 hr capsule  2. Which pharmacy/location (including street and city if local pharmacy) is medication to be sent to?  [facility]   3. Do they need a 30 day or 90 day supply?   Dr. Wynonia Lawman prescribed the potassium pill, and she forgot to to ask for the refill when she was in the office.    [name] prescribed Cardizem as a trial, but it is working well for her and she would like to stay on this medication

## 2018-12-02 ENCOUNTER — Other Ambulatory Visit (HOSPITAL_COMMUNITY): Payer: Medicare HMO

## 2018-12-24 ENCOUNTER — Other Ambulatory Visit: Payer: Self-pay | Admitting: Cardiology

## 2018-12-24 MED ORDER — LOSARTAN POTASSIUM 50 MG PO TABS: 50.0000 mg | ORAL_TABLET | Freq: Every day | ORAL | 3 refills | Status: DC

## 2018-12-24 NOTE — Telephone Encounter (Signed)
°*  STAT* If patient is at the pharmacy, call can be transferred to refill team.   1. Which medications need to be refilled? (please list name of each medication and dose if known) ELIQUIS 5 MG TABS tablet  2. Which pharmacy/location (including street and city if local pharmacy) is medication to be sent to? [facility]  Pt will be using [facility] as her new Pharmacy for all future refills   3. Do they need a 30 day or 90 day supply? 90 days

## 2018-12-24 NOTE — Telephone Encounter (Signed)
New message     *STAT* If patient is at the pharmacy, call can be transferred to refill team.   1. Which medications need to be refilled? (please list name of each medication and dose if known) Losartan 50mg    2. Which pharmacy/location (including street and city if local pharmacy) is medication to be sent to? [facility]  Pt will be using [facility] as her new Pharmacy for all future refills   3. Do they need a 30 day or 90 day supply? [place]

## 2018-12-25 MED ORDER — ELIQUIS 5 MG PO TABS: 5.0000 mg | ORAL_TABLET | Freq: Two times a day (BID) | ORAL | 1 refills | Status: DC

## 2018-12-25 NOTE — Telephone Encounter (Signed)
OV with Dr Martinique 11/02/2018 73yo Female 94.3kg Scr = 1.07 on 06/18/2018

## 2018-12-25 NOTE — Addendum Note (Signed)
Addended by: Harrington Challenger on: 12/25/2018 08:17 AM   Modules accepted: Orders

## 2018-12-28 ENCOUNTER — Ambulatory Visit (INDEPENDENT_AMBULATORY_CARE_PROVIDER_SITE_OTHER): Payer: Medicare HMO | Admitting: *Deleted

## 2018-12-28 ENCOUNTER — Other Ambulatory Visit: Payer: Self-pay

## 2018-12-28 DIAGNOSIS — I482 Chronic atrial fibrillation, unspecified: Secondary | ICD-10-CM | POA: Diagnosis not present

## 2018-12-28 DIAGNOSIS — I495 Sick sinus syndrome: Secondary | ICD-10-CM

## 2018-12-29 LAB — CUP PACEART REMOTE DEVICE CHECK
Date Time Interrogation Session: 20200512095116
Implantable Lead Implant Date: 20190711
Implantable Lead Implant Date: 20190711
Implantable Lead Location: 753859
Implantable Lead Location: 753860
Implantable Pulse Generator Implant Date: 20190711
Pulse Gen Model: 2272
Pulse Gen Serial Number: 9041042

## 2018-12-30 ENCOUNTER — Telehealth: Payer: Self-pay | Admitting: Cardiology

## 2018-12-30 ENCOUNTER — Ambulatory Visit: Payer: Medicare HMO | Admitting: Emergency Medicine

## 2018-12-30 ENCOUNTER — Telehealth: Payer: Self-pay | Admitting: Emergency Medicine

## 2018-12-30 MED ORDER — FUROSEMIDE 20 MG PO TABS: 20.0000 mg | ORAL_TABLET | Freq: Every day | ORAL | 3 refills | Status: DC

## 2018-12-30 MED ORDER — TIOTROPIUM BROMIDE-OLODATEROL 2.5-2.5 MCG/ACT IN AERS: 2.0000 | INHALATION_SPRAY | Freq: Every day | RESPIRATORY_TRACT | 1 refills | Status: AC

## 2018-12-30 NOTE — Telephone Encounter (Signed)
Returned call to patient she stated she needed 90 day refill for Lasix.Advised refill sent to mail order pharmacy.

## 2018-12-30 NOTE — Telephone Encounter (Signed)
New message   Pt c/o medication issue:  1. Name of Medication: furosemide (LASIX) 20 MG tablet  2. How are you currently taking this medication (dosage and times per day)? 1 time daily  3. Are you having a reaction (difficulty breathing--STAT)? no  4. What is your medication issue? Patient needs a new prescription sent to [facility] , [place], [place]. [phone]

## 2018-12-30 NOTE — Telephone Encounter (Signed)
Returned call to patient and verified Stiolto is only medication prescribed by Dr. Lamonte Sakai. It will be sent to her mail order pharmacy. Nothing further needed.

## 2019-01-12 NOTE — Progress Notes (Signed)
Remote pacemaker transmission.   

## 2019-01-28 ENCOUNTER — Telehealth: Payer: Self-pay | Admitting: *Deleted

## 2019-01-28 ENCOUNTER — Encounter: Payer: Self-pay | Admitting: *Deleted

## 2019-01-28 NOTE — Telephone Encounter (Signed)
Called patient to offer appointment with Chanetta Marshall, NP, for a device check on 02/01/19 at 12:40pm, prior to her echo. Pt is agreeable to this appointment and thanked me for my call.      COVID-19 Pre-Screening Questions:  . In the past 7 to 10 days have you had a cough,  shortness of breath, headache, congestion, fever (100 or greater) body aches, chills, sore throat, or sudden loss of taste or sense of smell? . Have you been around anyone with known Covid 19. . Have you been around anyone who is awaiting Covid 19 test results in the past 7 to 10 days? . Have you been around anyone who has been exposed to Covid 19, or has mentioned symptoms of Covid 19 within the past 7 to 10 days?  If you have any concerns/questions about symptoms patients report during screening (either on the phone or at threshold). Contact the provider seeing the patient or DOD for further guidance.  If neither are available contact a member of the leadership team.   Patient answered "no" to all questions. Aware to bring her own mask.

## 2019-01-29 ENCOUNTER — Telehealth (HOSPITAL_COMMUNITY): Payer: Self-pay | Admitting: Radiology

## 2019-01-29 NOTE — Telephone Encounter (Signed)

## 2019-02-01 ENCOUNTER — Other Ambulatory Visit: Payer: Self-pay

## 2019-02-01 ENCOUNTER — Ambulatory Visit (INDEPENDENT_AMBULATORY_CARE_PROVIDER_SITE_OTHER): Payer: Medicare HMO | Admitting: Nurse Practitioner

## 2019-02-01 ENCOUNTER — Ambulatory Visit (HOSPITAL_COMMUNITY): Payer: Medicare HMO | Attending: Cardiovascular Disease

## 2019-02-01 VITALS — BP 128/82 | HR 69 | Ht 65.5 in | Wt 218.0 lb

## 2019-02-01 DIAGNOSIS — Z79899 Other long term (current) drug therapy: Secondary | ICD-10-CM | POA: Diagnosis not present

## 2019-02-01 DIAGNOSIS — I482 Chronic atrial fibrillation, unspecified: Secondary | ICD-10-CM | POA: Diagnosis not present

## 2019-02-01 DIAGNOSIS — R600 Localized edema: Secondary | ICD-10-CM | POA: Diagnosis not present

## 2019-02-01 DIAGNOSIS — R001 Bradycardia, unspecified: Secondary | ICD-10-CM

## 2019-02-01 DIAGNOSIS — I10 Essential (primary) hypertension: Secondary | ICD-10-CM | POA: Diagnosis not present

## 2019-02-01 DIAGNOSIS — I4819 Other persistent atrial fibrillation: Secondary | ICD-10-CM

## 2019-02-01 DIAGNOSIS — R0602 Shortness of breath: Secondary | ICD-10-CM | POA: Insufficient documentation

## 2019-02-01 LAB — ECHOCARDIOGRAM COMPLETE
Height: 65.5 in
Weight: 3488 oz

## 2019-02-01 MED ORDER — DILTIAZEM HCL ER COATED BEADS 360 MG PO CP24: 360.0000 mg | ORAL_CAPSULE | Freq: Every day | ORAL | 3 refills | Status: DC

## 2019-02-01 MED ORDER — POTASSIUM CHLORIDE CRYS ER 10 MEQ PO TBCR: 10.0000 meq | EXTENDED_RELEASE_TABLET | Freq: Every day | ORAL | 3 refills | Status: DC

## 2019-02-01 NOTE — Patient Instructions (Signed)
Medication Instructions:  INCREASE DILTIAZEM to 360 mg DAILY INCREASE LASIX to 40 mg DAILY until Friday If you need a refill on your cardiac medications before your next appointment, please call your pharmacy.   Lab work:TODAY BMET If you have labs (blood work) drawn today and your tests are completely normal, you will receive your results only by: Marland Kitchen MyChart Message (if you have MyChart) OR . A paper copy in the mail If you have any lab test that is abnormal or we need to change your treatment, we will call you to review the results.  Testing/Procedures: NONE  Follow-Up: At [facility], you and your health needs are our priority.  As part of our continuing mission to provide you with exceptional heart care, we have created designated Provider Care Teams.  These Care Teams include your primary Cardiologist (physician) and Advanced Practice Providers (APPs -  Physician Assistants and Nurse Practitioners) who all work together to provide you with the care you need, when you need it. You will need a follow up appointment in 1 years.  Please call our office 2 months in advance to schedule this appointment.  You may see Dr Lovena Le or one of the following Advanced Practice Providers on your designated Care Team:   Chanetta Marshall, NP . Tommye Standard, PA-C  Any Other Special Instructions Will Be Listed Below (If Applicable). Remote monitoring is used to monitor your Pacemaker  from home. This monitoring reduces the number of office visits required to check your device to one time per year. It allows [place] to keep an eye on the functioning of your device to ensure it is working properly. You are scheduled for a device check from home on 03/29/19. You may send your transmission at any time that day. If you have a wireless device, the transmission will be sent automatically. After your physician reviews your transmission, you will receive a postcard with your next transmission date.

## 2019-02-01 NOTE — Progress Notes (Signed)
Electrophysiology Office Note Date: 02/01/2019  ID:  Carly Romero, DOB 1946/07/27, MRN [id number]  PCP: Carly Nip, MD Primary Cardiologist: Carly Electrophysiologist: Lovena Romero  CC: Pacemaker follow-up  Carly Romero is a 73 y.o. female seen today for Dr Lovena Romero.  She presents today for routine electrophysiology followup.  Since last being seen in our clinic, the patient reports doing relatively well.  After discussing with Dr Carly, she has decided not to pursue repeat ablation at [facility].  At home at rest, ventricular rates are 70-80's but she has RVR with activity.  She misses being able to garden and ride her bike.   She denies chest pain, PND, orthopnea, nausea, vomiting, dizziness, syncope, edema, weight gain, or early satiety.  Device History: STJ dual chamber PPM implanted 2019 for symptomatic bradycardia   Past Medical History:  Diagnosis Date  . Atrial fibrillation ([place])   . Factor V Leiden ([place])   . Hyperlipidemia   . Hypertension   . Hypertensive heart disease   . Hypothyroidism   . Obesity (BMI 30-39.9)   . Thyroid disease    Past Surgical History:  Procedure Laterality Date  . ATRIAL FIBRILLATION ABLATION  12/2014   Dr Carly [facility]ero [facility]  . ATRIAL FIBRILLATION ABLATION  08/2015   Dr Carly Rom[facility] at [facility]  . CARDIOVERSION N/A 05/26/2014   Procedure: CARDIOVERSION;  Surgeon: Carly Reedy, MD;  Location: [facility] ENDOSCOPY;  Service: Cardiovascular;  Laterality: N/A;  . CARDIOVERSION N/A 09/14/2014   Procedure: CARDIOVERSION;  Surgeon: Carly Reedy, MD;  Location: [facility] ENDOSCOPY;  Service: Cardiovascular;  Laterality: N/A;  . CARDIOVERSION N/A 05/11/2015   Procedure: CARDIOVERSION;  Surgeon: Carly Reedy, MD;  Location: [place];  Service: Cardiovascular;  Laterality: N/A;  . COSMETIC SURGERY     lower lids  . DENTAL SURGERY     wisdom teeth  . PACEMAKER IMPLANT N/A 02/26/2018   Procedure: PACEMAKER IMPLANT;  Surgeon: Evans Lance, MD;  Location: [facility] CV LAB;  Service: Cardiovascular;  Laterality: N/A;    Current Outpatient Medications  Medication Sig Dispense Refill  . BIOTIN PO Take 1 tablet by mouth daily.    . Calcium Carbonate-Vitamin D (CALCIUM + D PO) Take 1 tablet by mouth daily.    Marland Kitchen ELIQUIS 5 MG TABS tablet Take 1 tablet (5 mg total) by mouth 2 (two) times daily. 180 tablet 1  . FLUoxetine (PROZAC) 20 MG tablet Take 20 mg by mouth daily.    . furosemide (LASIX) 20 MG tablet Take 1 tablet (20 mg total) by mouth daily. 90 tablet 3  . levothyroxine (SYNTHROID, LEVOTHROID) 100 MCG tablet Take 100 mcg by mouth daily before breakfast.     . loratadine (CLARITIN) 10 MG tablet Take 10 mg by mouth daily as needed for allergies.     Marland Kitchen losartan (COZAAR) 50 MG tablet Take 1 tablet (50 mg total) by mouth daily. 90 tablet 3  . metoprolol tartrate (LOPRESSOR) 50 MG tablet Take 50 mg by mouth 2 (two) times daily.    . Multiple Vitamin (MULTIVITAMIN WITH MINERALS) TABS tablet Take 1 tablet by mouth daily.    Vladimir Faster Glycol-Propyl Glycol (SYSTANE OP) Place 1 drop into both eyes daily as needed (dry eyes).    . potassium chloride (K-DUR) 10 MEQ tablet Take 1 tablet (10 mEq total) by mouth daily. 90 tablet 3  . rosuvastatin (CRESTOR) 20 MG tablet Take 20 mg by mouth daily.    . Tiotropium Bromide-Olodaterol (STIOLTO RESPIMAT) 2.5-2.5  MCG/ACT AERS Inhale 2 puffs into the lungs daily. 12 Inhaler 1  . diltiazem (CARDIZEM CD) 360 MG 24 hr capsule Take 1 capsule (360 mg total) by mouth daily. 90 capsule 3   No current facility-administered medications for this visit.     Allergies:   Amiodarone, Chantix [varenicline], Codeine, and Lipitor [atorvastatin]   Social History: Social History   Socioeconomic History  . Marital status: Single    Spouse name: Not on file  . Number of children: Not on file  . Years of education: Not on file  . Highest education level: Not on file  Occupational History  . Not on file  Social Needs  .  Financial resource strain: Not on file  . Food insecurity    Worry: Not on file    Inability: Not on file  . Transportation needs    Medical: Not on file    Non-medical: Not on file  Tobacco Use  . Smoking status: Former Smoker    Packs/day: 1.00    Years: 40.00    Pack years: 40.00    Quit date: 12/11/2013    Years since quitting: 5.1  . Smokeless tobacco: Never Used  Substance and Sexual Activity  . Alcohol use: Yes    Alcohol/week: 8.0 standard drinks    Types: 8 Glasses of wine per week  . Drug use: No  . Sexual activity: Never  Lifestyle  . Physical activity    Days per week: Not on file    Minutes per session: Not on file  . Stress: Not on file  Relationships  . Social Herbalist on phone: Not on file    Gets together: Not on file    Attends religious service: Not on file    Active member of club or organization: Not on file    Attends meetings of clubs or organizations: Not on file    Relationship status: Not on file  . Intimate partner violence    Fear of current or ex partner: Not on file    Emotionally abused: Not on file    Physically abused: Not on file    Forced sexual activity: Not on file  Other Topics Concern  . Not on file  Social History Narrative  . Not on file    Family History: Family History  Problem Relation Age of Onset  . Diabetes Brother      Review of Systems: All other systems reviewed and are otherwise negative except as noted above.   Physical Exam: VS:  BP 128/82   Pulse 69   Ht 5' 5.5" (1.664 m)   Wt 218 lb (98.9 kg)   SpO2 95%   BMI 35.73 kg/m  , BMI Body mass index is 35.73 kg/m.  GEN- The patient is elderly appearing, alert and oriented x 3 today.   HEENT: normocephalic, atraumatic; sclera clear, conjunctiva pink; hearing intact; oropharynx clear; neck supple  Lungs- Clear to ausculation bilaterally, normal work of breathing.  No wheezes, rales, rhonchi Heart- Irregular rate and rhythm GI- soft,  non-tender, non-distended, bowel sounds present  Extremities- no clubbing, cyanosis, 1+ BLE edema MS- no significant deformity or atrophy Skin- warm and dry, no rash or lesion; PPM pocket well healed Psych- euthymic mood, full affect Neuro- strength and sensation are intact  PPM Interrogation- reviewed in detail today,  See PACEART report  EKG:  EKG is not ordered today.  Recent Labs: 02/25/2018: ALT 31; B Natriuretic Peptide 854.2; Magnesium 2.0  05/04/2018: Hemoglobin 14.4; Platelets 223; TSH 2.420 06/18/2018: BUN 25; Creatinine, Ser 1.07; Potassium 4.1; Sodium 144   Wt Readings from Last 3 Encounters:  02/01/19 218 lb (98.9 kg)  11/02/18 208 lb (94.3 kg)  08/27/18 209 lb (94.8 kg)     Other studies Reviewed: Additional studies/ records that were reviewed today include: Dr Carly and Dr Tanna Furry office notes  Assessment and Plan:  1.  Symptomatic bradycardia Normal PPM function See Pace Art report No changes today RV autocapture reading falsely elevated. RV output fixed today at 2.5V  2.  Permanent atrial fibrillation V rates elevated at times BP stable Will increase Cardizem to 360mg  daily Echo pending today Continue Eliquis for CHADS2VASC of at least 3    3.  Romero edema Weight also up 10 pounds since March visit  Increase Lasix to 40mg  daily today until office visit on Friday with Dr Carly If Romero edema improved, could consider staying at that dose BMET today   4.  HTN Stable No change required today    Current medicines are reviewed at length with the patient today.   The patient does not have concerns regarding her medicines.  The following changes were made today:  Increase Cardizem to 360mg  daily, increase Lasix to 40mg  daily  Labs/ tests ordered today include:  Orders Placed This Encounter  Procedures  . Basic Metabolic Panel (BMET)     Disposition:   Follow up with Dr Carly as scheduled, [place], 1 year Dr Lovena Romero     Signed, Chanetta Marshall, NP  02/01/2019 2:17 PM  [facility] [address] [place] [id number] [phone] (office) [phone] (fax)

## 2019-02-02 LAB — BASIC METABOLIC PANEL
BUN/Creatinine Ratio: 15 (ref 12–28)
BUN: 20 mg/dL (ref 8–27)
CO2: 25 mmol/L (ref 20–29)
Calcium: 9.8 mg/dL (ref 8.7–10.3)
Chloride: 98 mmol/L (ref 96–106)
Creatinine, Ser: 1.36 mg/dL — ABNORMAL HIGH (ref 0.57–1.00)
GFR calc Af Amer: 45 mL/min/{1.73_m2} — ABNORMAL LOW (ref >59)
GFR calc non Af Amer: 39 mL/min/{1.73_m2} — ABNORMAL LOW (ref >59)
Glucose: 110 mg/dL — ABNORMAL HIGH (ref 65–99)
Potassium: 4.2 mmol/L (ref 3.5–5.2)
Sodium: 141 mmol/L (ref 134–144)

## 2019-02-03 NOTE — Progress Notes (Signed)
Virtual Visit via Video Note   This visit type was conducted due to national recommendations for restrictions regarding the COVID-19 Pandemic (e.g. social distancing) in an effort to limit this patient's exposure and mitigate transmission in our community.  Due to her co-morbid illnesses, this patient is at least at moderate risk for complications without adequate follow up.  This format is felt to be most appropriate for this patient at this time.  All issues noted in this document were discussed and addressed.  A limited physical exam was performed with this format.  Please refer to the patient's chart for her consent to telehealth for [facility]   Date:  02/05/2019   ID:  Carly Romero, DOB 11/04/1945, MRN [id number]  Patient Location: Home Provider Location: Office  PCP:  Aretta Nip, MD  Cardiologist:  Peter Martinique MD Electrophysiologist:  None   Evaluation Performed:  Follow-Up Visit  Chief Complaint:  Follow up Afib.   History of Present Illness:    Carly Romero is a 73 y.o. female with a history of PAF dating back to 10. She had been followed by Dr Wynonia Lawman as her general cardiologist but she has requested transfer of her care to our group. She had RFA in May 2016 and again in Jan 2017 by Dr Glennon Mac at [facility]. She has failed Tikosyn and has been intolerant to Amiodarone (alopecia).  She is s/p pacemaker implant and is followed by Dr. Lovena Le.  She has no history of coronary artery disease and has never had a cath but had a stress test remotely.   On her last visit she complained of chronic dyspnea on exertion. She states she cannot do anything without giving out of breath. She was evaluated by Dr Lamonte Sakai with pulmonary. PFTs c/w severe COPD. Inhalers started. She did appear to have a significant bronchodilator response.  Developed AFib in December. Seen initially in the  AFib clinic and had converted to NSR. Seen a couple weeks later and Afib had returned and was  persistent. Had failed Tikosyn before. Poor candidate for amiodarone with lung disease. Consideration was to live in AFib versus seeing Dr. Glennon Mac at [facility] to see if another ablation is an option.   She does use CPAP managed by Dr. Maxwell Caul. She has long prior history of tobacco abuse.  She has a long history of DVTs with Factor V Leiden deficiency. Has been on anticoagulation since she was 73 years old.   On her last visit we added cardizem CD for rate control and metoprolol dose was reduced. Last pacemaker check on 12/28/18 showed good rate control. She also notes her lasix was increased by EP to 40 mg daily. She is using her inhaler regularly now and finds her breathing is much better. No chest pain or dizziness. No edema.   The patient does not have symptoms concerning for COVID-19 infection (fever, chills, cough, or new shortness of breath).    Past Medical History:  Diagnosis Date  . Atrial fibrillation ([place])   . Factor V Leiden ([place])   . Hyperlipidemia   . Hypertension   . Hypertensive heart disease   . Hypothyroidism   . Obesity (BMI 30-39.9)   . Thyroid disease    Past Surgical History:  Procedure Laterality Date  . ATRIAL FIBRILLATION ABLATION  12/2014   Dr Glennon Mac at [facility]  . ATRIAL FIBRILLATION ABLATION  08/2015   Dr Glennon Mac at [facility]  . CARDIOVERSION N/A 05/26/2014   Procedure: CARDIOVERSION;  Surgeon: Jacolyn Reedy,  MD;  Location: [place];  Service: Cardiovascular;  Laterality: N/A;  . CARDIOVERSION N/A 09/14/2014   Procedure: CARDIOVERSION;  Surgeon: Jacolyn Reedy, MD;  Location: [facility] ENDOSCOPY;  Service: Cardiovascular;  Laterality: N/A;  . CARDIOVERSION N/A 05/11/2015   Procedure: CARDIOVERSION;  Surgeon: Jacolyn Reedy, MD;  Location: [place];  Service: Cardiovascular;  Laterality: N/A;  . COSMETIC SURGERY     lower lids  . DENTAL SURGERY     wisdom teeth  . PACEMAKER IMPLANT N/A 02/26/2018   Procedure: PACEMAKER IMPLANT;  Surgeon: Evans Lance, MD;   Location: [place] CV LAB;  Service: Cardiovascular;  Laterality: N/A;     Current Meds  Medication Sig  . BIOTIN PO Take 1 tablet by mouth daily.  . Calcium Carbonate-Vitamin D (CALCIUM + D PO) Take 1 tablet by mouth daily.  Marland Kitchen ELIQUIS 5 MG TABS tablet Take 1 tablet (5 mg total) by mouth 2 (two) times daily.  Marland Kitchen FLUoxetine (PROZAC) 20 MG tablet Take 20 mg by mouth daily.  Marland Kitchen levothyroxine (SYNTHROID, LEVOTHROID) 100 MCG tablet Take 100 mcg by mouth daily before breakfast.   . loratadine (CLARITIN) 10 MG tablet Take 10 mg by mouth daily as needed for allergies.   Marland Kitchen losartan (COZAAR) 50 MG tablet Take 1 tablet (50 mg total) by mouth daily.  . metoprolol tartrate (LOPRESSOR) 50 MG tablet Take 50 mg by mouth 2 (two) times daily.  . Multiple Vitamin (MULTIVITAMIN WITH MINERALS) TABS tablet Take 1 tablet by mouth daily.  Vladimir Faster Glycol-Propyl Glycol (SYSTANE OP) Place 1 drop into both eyes daily as needed (dry eyes).  . potassium chloride (K-DUR) 10 MEQ tablet Take 1 tablet (10 mEq total) by mouth daily.  . rosuvastatin (CRESTOR) 20 MG tablet Take 20 mg by mouth daily.  . Tiotropium Bromide-Olodaterol (STIOLTO RESPIMAT) 2.5-2.5 MCG/ACT AERS Inhale 2 puffs into the lungs daily.  . [DISCONTINUED] diltiazem (CARDIZEM CD) 360 MG 24 hr capsule Take 1 capsule (360 mg total) by mouth daily. (Patient taking differently: Take 240 mg by mouth daily. )  . [DISCONTINUED] furosemide (LASIX) 20 MG tablet Take 1 tablet (20 mg total) by mouth daily.     Allergies:   Amiodarone, Chantix [varenicline], Codeine, and Lipitor [atorvastatin]   Social History   Tobacco Use  . Smoking status: Former Smoker    Packs/day: 1.00    Years: 40.00    Pack years: 40.00    Quit date: 12/11/2013    Years since quitting: 5.1  . Smokeless tobacco: Never Used  Substance Use Topics  . Alcohol use: Yes    Alcohol/week: 8.0 standard drinks    Types: 8 Glasses of wine per week  . Drug use: No     Family Hx: The  patient's family history includes Diabetes in her brother.  ROS:   Please see the history of present illness.    All other systems reviewed and are negative.   Prior CV studies:   The following studies were reviewed today:  Echo 02/25/18:  Study Conclusions  - Left ventricle: The cavity size was normal. There was severe concentric hypertrophy. Systolic function was normal. The estimated ejection fraction was in the range of 50% to 55%. Wall motion was normal; there were no regional wall motion abnormalities. Doppler parameters are consistent with a reversible restrictive pattern, indicative of decreased left ventricular diastolic compliance and/or increased left atrial pressure (grade 3 diastolic dysfunction). - Aortic valve: There was mild to moderate stenosis. There was mild regurgitation.  Mean gradient (S): 9 mm Hg. Peak gradient (S): 21 mm Hg. Valve area (VTI): 1.44 cm^2. - Aorta: The aorta was mildly dilated. Ascending aortic diameter: 36 mm (S). - Mitral valve: Calcified annulus. - Left atrium: The atrium was moderately to severely dilated. Volume, S: 102 ml. Volume/bsa, S: 48.4 ml/m^2.  Impressions:  - Diastolic dysfunction, severe LVH, calcified aortic and mitral valves with stenosis/regurgitation as noted, severely dilated left atrium.  Echo 02/01/19: IMPRESSIONS    1. The left ventricle has normal systolic function, with an ejection fraction of 55-60%. The cavity size was normal. There is severely increased left ventricular wall thickness. Left ventricular diastolic Doppler parameters are indeterminate.  2. Inferior basal hypokinesis but overall EF preserved.  3. The right ventricle has normal systolic function. The cavity was normal. There is no increase in right ventricular wall thickness.  4. Left atrial size was moderately dilated.  5. Moderate thickening of the mitral valve leaflet. Mild calcification of the mitral valve leaflet.  There is moderate mitral annular calcification present.  6. The aortic valve was not well visualized. Moderate thickening of the aortic valve. Sclerosis without any evidence of stenosis of the aortic valve. Aortic valve regurgitation is trivial by color flow Doppler.  Labs/Other Tests and Data Reviewed:    EKG:  No ECG reviewed.  Recent Labs: 02/25/2018: ALT 31; B Natriuretic Peptide 854.2; Magnesium 2.0 05/04/2018: Hemoglobin 14.4; Platelets 223; TSH 2.420 02/01/2019: BUN 20; Creatinine, Ser 1.36; Potassium 4.2; Sodium 141   Recent Lipid Panel Lab Results  Component Value Date/Time   CHOL 208 (H) 01/14/2018 10:09 AM   TRIG 164 (H) 01/14/2018 10:09 AM   HDL 73 01/14/2018 10:09 AM   CHOLHDL 2.8 01/14/2018 10:09 AM   LDLCALC 102 (H) 01/14/2018 10:09 AM   Dated 10/14/18: cholesterol 180, triglycerides 140, HDL 56, LDL 96. LFTs normal   Wt Readings from Last 3 Encounters:  02/05/19 218 lb (98.9 kg)  02/01/19 218 lb (98.9 kg)  11/02/18 208 lb (94.3 kg)     Objective:    Vital Signs:  Pulse 85   Ht 5' 5.5" (1.664 m)   Wt 218 lb (98.9 kg)   BMI 35.73 kg/m    VITAL SIGNS:  reviewed  General no distress. Obese HEENT wears glasses. Sclera are clear. Respirations unlabored Skin dry Neuro alert and oriented x 3. nonfocal Mood upbeat   ASSESSMENT & PLAN:    1. Persistent  AFib s/p ablation x 2. Failed therapy with Tikosyn. Intolerant of amiodarone due to alopecia but also should be avoided with underlying lung disease. A third ablation attempt is probably not the best option. She is on chronic anticoagulation. Afib rate control good on combination of metoprolol and Cardizem.  Will continue current regimen since her breathing is doing so much better and rate control is good. Recent Echo reviewed showed good LV function with LVH. 2. Sinus node dysfunction. S/p pacemaker 3. Severe COPD with bronchospasm. Much improved with daily inhaler use. Follow up with Dr Lamonte Sakai 4. HTN controlled.   5. OSA on CPAP 6. Recurrent DVTs with Factor V Leiden deficiency. On chronic anticoagulation.   COVID-19 Education: The signs and symptoms of COVID-19 were discussed with the patient and how to seek care for testing (follow up with PCP or arrange E-visit).  The importance of social distancing was discussed today.  Time:   Today, I have spent 15 minutes with the patient with telehealth technology discussing the above problems.     Medication Adjustments/Labs  and Tests Ordered: Current medicines are reviewed at length with the patient today.  Concerns regarding medicines are outlined above.   Tests Ordered: No orders of the defined types were placed in this encounter.   Medication Changes: Meds ordered this encounter  Medications  . diltiazem (CARDIZEM CD) 240 MG 24 hr capsule    Sig: Take 1 capsule (240 mg total) by mouth daily.    Dispense:  90 capsule    Refill:  3  . furosemide (LASIX) 40 MG tablet    Sig: Take 1 tablet (40 mg total) by mouth daily.    Dispense:  90 tablet    Refill:  3    Follow Up:  Virtual Visit in 6 month(s)  Signed, Peter Martinique, MD  02/05/2019 2:43 PM    [facility]

## 2019-02-05 ENCOUNTER — Encounter: Payer: Self-pay | Admitting: Cardiology

## 2019-02-05 ENCOUNTER — Telehealth (INDEPENDENT_AMBULATORY_CARE_PROVIDER_SITE_OTHER): Payer: Medicare HMO | Admitting: Cardiology

## 2019-02-05 VITALS — HR 85 | Ht 65.5 in | Wt 218.0 lb

## 2019-02-05 DIAGNOSIS — I495 Sick sinus syndrome: Secondary | ICD-10-CM

## 2019-02-05 DIAGNOSIS — R0602 Shortness of breath: Secondary | ICD-10-CM

## 2019-02-05 DIAGNOSIS — Z950 Presence of cardiac pacemaker: Secondary | ICD-10-CM

## 2019-02-05 DIAGNOSIS — I482 Chronic atrial fibrillation, unspecified: Secondary | ICD-10-CM | POA: Diagnosis not present

## 2019-02-05 DIAGNOSIS — I10 Essential (primary) hypertension: Secondary | ICD-10-CM

## 2019-02-05 MED ORDER — FUROSEMIDE 40 MG PO TABS: 40.0000 mg | ORAL_TABLET | Freq: Every day | ORAL | 3 refills | Status: DC

## 2019-02-05 MED ORDER — DILTIAZEM HCL ER COATED BEADS 240 MG PO CP24: 240.0000 mg | ORAL_CAPSULE | Freq: Every day | ORAL | 3 refills | Status: DC

## 2019-02-05 NOTE — Patient Instructions (Signed)

## 2019-02-17 ENCOUNTER — Telehealth: Payer: Self-pay | Admitting: Cardiology

## 2019-02-17 NOTE — Progress Notes (Signed)
Opened in error

## 2019-02-17 NOTE — Telephone Encounter (Signed)
New message    *STAT* If patient is at the pharmacy, call can be transferred to refill team.   1. Which medications need to be refilled? (please list name of each medication and dose if known) metoprolol tartrate (LOPRESSOR) 50 MG tablet  2. Which pharmacy/location (including street and city if local pharmacy) is medication to be sent to?[facility] [phone]   3. Do they need a 30 day or 90 day supply? [place]

## 2019-02-18 MED ORDER — METOPROLOL TARTRATE 50 MG PO TABS: 50.0000 mg | ORAL_TABLET | Freq: Two times a day (BID) | ORAL | 3 refills | Status: DC

## 2019-02-18 NOTE — Telephone Encounter (Signed)
Rx(s) sent to pharmacy electronically.  

## 2019-03-11 ENCOUNTER — Ambulatory Visit: Payer: Medicare HMO | Admitting: Emergency Medicine

## 2019-03-23 ENCOUNTER — Ambulatory Visit: Payer: Medicare HMO | Admitting: Emergency Medicine

## 2019-03-29 ENCOUNTER — Ambulatory Visit (INDEPENDENT_AMBULATORY_CARE_PROVIDER_SITE_OTHER): Payer: Medicare HMO | Admitting: *Deleted

## 2019-03-29 DIAGNOSIS — I495 Sick sinus syndrome: Secondary | ICD-10-CM | POA: Diagnosis not present

## 2019-03-29 LAB — CUP PACEART REMOTE DEVICE CHECK
Date Time Interrogation Session: 20200810074547
Implantable Lead Implant Date: 20190711
Implantable Lead Implant Date: 20190711
Implantable Lead Location: 753859
Implantable Lead Location: 753860
Implantable Pulse Generator Implant Date: 20190711
Pulse Gen Model: 2272
Pulse Gen Serial Number: 9041042

## 2019-04-06 ENCOUNTER — Encounter: Payer: Self-pay | Admitting: Cardiology

## 2019-04-06 NOTE — Progress Notes (Signed)
Remote pacemaker transmission.   

## 2019-04-19 ENCOUNTER — Other Ambulatory Visit: Payer: Self-pay

## 2019-04-19 ENCOUNTER — Ambulatory Visit: Payer: Medicare HMO | Admitting: Emergency Medicine

## 2019-04-19 ENCOUNTER — Encounter: Payer: Self-pay | Admitting: Emergency Medicine

## 2019-04-19 DIAGNOSIS — J449 Chronic obstructive pulmonary disease, unspecified: Secondary | ICD-10-CM | POA: Diagnosis not present

## 2019-04-19 DIAGNOSIS — Z23 Encounter for immunization: Secondary | ICD-10-CM

## 2019-04-19 DIAGNOSIS — J4489 Other specified chronic obstructive pulmonary disease: Secondary | ICD-10-CM

## 2019-04-19 DIAGNOSIS — G4733 Obstructive sleep apnea (adult) (pediatric): Secondary | ICD-10-CM

## 2019-04-19 DIAGNOSIS — J310 Chronic rhinitis: Secondary | ICD-10-CM | POA: Diagnosis not present

## 2019-04-19 NOTE — Assessment & Plan Note (Signed)
Continue CPAP every night as she has been using it.  Good compliance, good clinical benefit.

## 2019-04-19 NOTE — Assessment & Plan Note (Addendum)
She is unsure whether the Stiolto has been beneficial.  We will try stopping it, see if she misses it in retrospect.  Her PA was approved, remains expensive, $400 for 3 months.  There may be a less expensive alternative.  If she calls [place] and let us know that she misses the Stiolto then we can try Anoro, alternatives.  I will discuss with pharmacist at that time.  She needs a prescription for albuterol.  Flu shot today

## 2019-04-19 NOTE — Patient Instructions (Addendum)
Please try stopping Stiolto to see if you miss it.  If you notice that your breathing worsens off of this medication then we can either restart it or look for a more affordable alternative. We will prescribe albuterol.  Take 2 puffs up to every 4 hours if needed for shortness of breath, chest tightness, wheezing. Please go back on your loratadine 10 mg (Claritin) once daily. Follow with Dr Lamonte Sakai in 3 months or sooner if you have any problems.

## 2019-04-19 NOTE — Assessment & Plan Note (Signed)
Increased cough, increased throat mucus.  No real sputum.  She stopped her loratadine and I think she needs to go back on it.  We discussed today.

## 2019-04-19 NOTE — Progress Notes (Signed)
Subjective:    Patient ID: Carly Romero, female    DOB: 21-Mar-1946, 73 y.o.   MRN: [id number]  HPI   ROV 06/25/18 --this follow-up visit for exertional dyspnea.  Patient has a history of tobacco use, DVT/PE (factor V Leiden homozygous), atrial fibrillation, hypertension, OSA on CPAP.  She is on Eliquis for her A. fib and also history of PE.  At her last visit we arrange for pulmonary function testing and these were done today.  I have reviewed.  This shows severe obstruction with a positive bronchodilator response consistent with COPD/asthma.  Her lung volumes show borderline hyperinflation.  Her diffusion capacity is decreased and does not fully correct when adjusted for alveolar volume. Her functional capacity remains limited.   ROV 04/19/2019 --73 year old woman with severe COPD, positive bronchodilator response, history of DVT/PE (factor V Leiden), atrial fibrillation, hypertension, OSA on CPAP.  She returns today for regular follow-up.  She reports .  We started Stiolto last time - she isn't sure whether it has helped her very much.  She doesn't have an albuterol HFA. She does still have some exertional SOB. She has a non-productive cough. Doesn't feel PND.   Good compliance with her CPAP - good clinical benefit, sleeps better. Less daytime sleepiness. Remains on Eliquis. She stopped her loratadine    Review of Systems  Constitutional: Positive for unexpected weight change. Negative for fever.  HENT: Positive for congestion and postnasal drip. Negative for dental problem, ear pain, nosebleeds, rhinorrhea, sinus pressure, sneezing, sore throat and trouble swallowing.   Eyes: Negative for redness and itching.  Respiratory: Positive for cough, shortness of breath and wheezing. Negative for chest tightness.   Cardiovascular: Positive for palpitations and leg swelling.  Gastrointestinal: Negative for nausea and vomiting.  Genitourinary: Negative for dysuria.  Musculoskeletal: Negative for  joint swelling.  Skin: Negative for rash.  Allergic/Immunologic: Positive for environmental allergies and food allergies. Negative for immunocompromised state.  Neurological: Negative for headaches.  Hematological: Does not bruise/bleed easily.  Psychiatric/Behavioral: Positive for dysphoric mood. The patient is nervous/anxious.    Past Medical History:  Diagnosis Date  . Atrial fibrillation ([place])   . Factor V Leiden ([place])   . Hyperlipidemia   . Hypertension   . Hypertensive heart disease   . Hypothyroidism   . Obesity (BMI 30-39.9)   . Thyroid disease      Family History  Problem Relation Age of Onset  . Diabetes Brother      Social History   Socioeconomic History  . Marital status: Single    Spouse name: Not on file  . Number of children: Not on file  . Years of education: Not on file  . Highest education level: Not on file  Occupational History  . Not on file  Social Needs  . Financial resource strain: Not on file  . Food insecurity    Worry: Not on file    Inability: Not on file  . Transportation needs    Medical: Not on file    Non-medical: Not on file  Tobacco Use  . Smoking status: Former Smoker    Packs/day: 1.00    Years: 40.00    Pack years: 40.00    Quit date: 12/11/2013    Years since quitting: 5.3  . Smokeless tobacco: Never Used  Substance and Sexual Activity  . Alcohol use: Yes    Alcohol/week: 8.0 standard drinks    Types: 8 Glasses of wine per week  . Drug use: No  .  Sexual activity: Never  Lifestyle  . Physical activity    Days per week: Not on file    Minutes per session: Not on file  . Stress: Not on file  Relationships  . Social Herbalist on phone: Not on file    Gets together: Not on file    Attends religious service: Not on file    Active member of club or organization: Not on file    Attends meetings of clubs or organizations: Not on file    Relationship status: Not on file  . Intimate partner violence    Fear of  current or ex partner: Not on file    Emotionally abused: Not on file    Physically abused: Not on file    Forced sexual activity: Not on file  Other Topics Concern  . Not on file  Social History Narrative  . 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   Allergies  Allergen Reactions  . Amiodarone Other (See Comments)    alopecia  . Chantix [Varenicline]     Suicidal thoughts  . Codeine Swelling    Swelling as a young adult  . Lipitor [Atorvastatin] Diarrhea    And cramping.     Outpatient Medications Prior to Visit  Medication Sig Dispense Refill  . BIOTIN PO Take 1 tablet by mouth daily.    . Calcium Carbonate-Vitamin D (CALCIUM + D PO) Take 1 tablet by mouth daily.    Marland Kitchen diltiazem (CARDIZEM CD) 240 MG 24 hr capsule Take 1 capsule (240 mg total) by mouth daily. 90 capsule 3  . ELIQUIS 5 MG TABS tablet Take 1 tablet (5 mg total) by mouth 2 (two) times daily. 180 tablet 1  . FLUoxetine (PROZAC) 20 MG tablet Take 20 mg by mouth daily.    . furosemide (LASIX) 40 MG tablet Take 1 tablet (40 mg total) by mouth daily. 90 tablet 3  . levothyroxine (SYNTHROID, LEVOTHROID) 100 MCG tablet Take 100 mcg by mouth daily before breakfast.     . loratadine (CLARITIN) 10 MG tablet Take 10 mg by mouth daily as needed for allergies.     Marland Kitchen losartan (COZAAR) 50 MG tablet Take 1 tablet (50 mg total) by mouth daily. 90 tablet 3  . metoprolol tartrate (LOPRESSOR) 50 MG tablet Take 1 tablet (50 mg total) by mouth 2 (two) times daily. 180 tablet 3  . Multiple Vitamin (MULTIVITAMIN WITH MINERALS) TABS tablet Take 1 tablet by mouth daily.    Vladimir Faster Glycol-Propyl Glycol (SYSTANE OP) Place 1 drop into both eyes daily as needed (dry eyes).    . potassium chloride (K-DUR) 10 MEQ tablet Take 1 tablet (10 mEq total) by mouth daily. 90 tablet 3  . rosuvastatin (CRESTOR) 20 MG tablet Take 20 mg by mouth daily.    . Tiotropium Bromide-Olodaterol (STIOLTO  RESPIMAT) 2.5-2.5 MCG/ACT AERS Inhale 2 puffs into the lungs daily. 12 Inhaler 1   No facility-administered medications prior to visit.         Objective:   Physical Exam Vitals:   04/19/19 1323  BP: 124/82  Pulse: 66  SpO2: 99%  Weight: 220 lb (99.8 kg)  Height: 5\' 5"  (1.651 m)   Gen: Pleasant, well-nourished, in no distress,  normal affect  ENT: No lesions,  mouth clear,  oropharynx clear, no postnasal drip  Neck: No JVD, no stridor  Lungs: No use of  accessory muscles, no crackles or wheezes.   Cardiovascular: RRR, heart sounds normal, no murmur or gallops, no peripheral edema  Musculoskeletal: No deformities, no cyanosis or clubbing  Neuro: alert, non focal  Skin: Warm, no lesions or rash     Assessment & Plan:  Sleep apnea Continue CPAP every night as she has been using it.  Good compliance, good clinical benefit.  COPD with asthma [facility]) She is unsure whether the Stiolto has been beneficial.  We will try stopping it, see if she misses it in retrospect.  Her PA was approved, remains expensive, $400 for 3 months.  There may be a less expensive alternative.  If she calls [place] and let us know that she misses the Stiolto then we can try Anoro, alternatives.  I will discuss with pharmacist at that time.  She needs a prescription for albuterol.  Flu shot today  Chronic rhinitis Increased cough, increased throat mucus.  No real sputum.  She stopped her loratadine and I think she needs to go back on it.  We discussed today.  Baltazar Apo, MD, PhD 04/19/2019, 1:44 PM [facility] [phone] or if no answer [phone]

## 2019-04-27 ENCOUNTER — Telehealth: Payer: Self-pay | Admitting: Emergency Medicine

## 2019-04-27 NOTE — Telephone Encounter (Signed)
ATC pt, no answer. Left message for pt to call back.  

## 2019-04-29 MED ORDER — ALBUTEROL SULFATE HFA 108 (90 BASE) MCG/ACT IN AERS: 2.0000 | INHALATION_SPRAY | Freq: Four times a day (QID) | RESPIRATORY_TRACT | 5 refills | Status: AC | PRN

## 2019-04-29 NOTE — Telephone Encounter (Signed)
Spoke with pt. She is needing a refill on Albuterol HFA. Rx has been sent in. Nothing further was needed. 

## 2019-05-19 DIAGNOSIS — J449 Chronic obstructive pulmonary disease, unspecified: Secondary | ICD-10-CM | POA: Diagnosis not present

## 2019-05-19 DIAGNOSIS — I10 Essential (primary) hypertension: Secondary | ICD-10-CM | POA: Diagnosis not present

## 2019-05-19 DIAGNOSIS — M1990 Unspecified osteoarthritis, unspecified site: Secondary | ICD-10-CM | POA: Diagnosis not present

## 2019-05-19 DIAGNOSIS — I4891 Unspecified atrial fibrillation: Secondary | ICD-10-CM | POA: Diagnosis not present

## 2019-05-19 DIAGNOSIS — E039 Hypothyroidism, unspecified: Secondary | ICD-10-CM | POA: Diagnosis not present

## 2019-05-19 DIAGNOSIS — I480 Paroxysmal atrial fibrillation: Secondary | ICD-10-CM | POA: Diagnosis not present

## 2019-05-24 ENCOUNTER — Telehealth: Payer: Self-pay | Admitting: Emergency Medicine

## 2019-05-24 NOTE — Telephone Encounter (Signed)
LMTCB for the pt 

## 2019-05-24 NOTE — Telephone Encounter (Signed)
Spoke with [place] at [place]  She states that the pt did try stopping her stiolto, but recently started back on it  She is paying over $100 for her copay but currently taking the medication  [place] checked into alternatives and they are trelegy or symbicort  She is checking to see if either of these would be a good alternative to the stiolto  Please advise and then we can call the pt and discuss with her from there  Per Christian, not urgent, as the pt not out of her medication  Please advise, thanks!

## 2019-05-24 NOTE — Telephone Encounter (Signed)
I would chose trelegy. Do a trial to see if she benefits.

## 2019-05-25 NOTE — Telephone Encounter (Signed)
ATC patient unable to reach LM to call back office (x2) 

## 2019-05-26 NOTE — Telephone Encounter (Signed)
ATC pt, line went to voicemail, LMTCB x3. Per triage protocol, will close out this message. Nothing further needed at this time.

## 2019-06-01 DIAGNOSIS — I480 Paroxysmal atrial fibrillation: Secondary | ICD-10-CM | POA: Diagnosis not present

## 2019-06-01 DIAGNOSIS — I4891 Unspecified atrial fibrillation: Secondary | ICD-10-CM | POA: Diagnosis not present

## 2019-06-01 DIAGNOSIS — I10 Essential (primary) hypertension: Secondary | ICD-10-CM | POA: Diagnosis not present

## 2019-06-01 DIAGNOSIS — E7800 Pure hypercholesterolemia, unspecified: Secondary | ICD-10-CM | POA: Diagnosis not present

## 2019-06-01 DIAGNOSIS — M1990 Unspecified osteoarthritis, unspecified site: Secondary | ICD-10-CM | POA: Diagnosis not present

## 2019-06-01 DIAGNOSIS — J449 Chronic obstructive pulmonary disease, unspecified: Secondary | ICD-10-CM | POA: Diagnosis not present

## 2019-06-01 DIAGNOSIS — E039 Hypothyroidism, unspecified: Secondary | ICD-10-CM | POA: Diagnosis not present

## 2019-06-09 ENCOUNTER — Other Ambulatory Visit: Payer: Self-pay | Admitting: Cardiology

## 2019-06-10 ENCOUNTER — Telehealth: Payer: Self-pay | Admitting: Emergency Medicine

## 2019-06-10 MED ORDER — TRELEGY ELLIPTA 100-62.5-25 MCG/INH IN AEPB: 1.0000 | INHALATION_SPRAY | Freq: Every day | RESPIRATORY_TRACT | 5 refills | Status: AC

## 2019-06-10 NOTE — Telephone Encounter (Signed)
Collene Gobble, MD to [facility] Triage Pool    Note   I would chose trelegy. Do a trial to see if she benefits.      Call returned to patient, confirmed DOB, made aware trellegy can be sent in. Confirmed which pharmacy. Medication sent. Made aware if it is too expensive even with the insurance covering it let us know and we can send her a patient assistance application. Voiced understanding. Nothing further needed at this time.

## 2019-06-19 DIAGNOSIS — I480 Paroxysmal atrial fibrillation: Secondary | ICD-10-CM | POA: Diagnosis not present

## 2019-06-19 DIAGNOSIS — J449 Chronic obstructive pulmonary disease, unspecified: Secondary | ICD-10-CM | POA: Diagnosis not present

## 2019-06-19 DIAGNOSIS — E039 Hypothyroidism, unspecified: Secondary | ICD-10-CM | POA: Diagnosis not present

## 2019-06-19 DIAGNOSIS — E7800 Pure hypercholesterolemia, unspecified: Secondary | ICD-10-CM | POA: Diagnosis not present

## 2019-06-19 DIAGNOSIS — I4891 Unspecified atrial fibrillation: Secondary | ICD-10-CM | POA: Diagnosis not present

## 2019-06-19 DIAGNOSIS — I10 Essential (primary) hypertension: Secondary | ICD-10-CM | POA: Diagnosis not present

## 2019-06-19 DIAGNOSIS — M1990 Unspecified osteoarthritis, unspecified site: Secondary | ICD-10-CM | POA: Diagnosis not present

## 2019-06-28 ENCOUNTER — Ambulatory Visit (INDEPENDENT_AMBULATORY_CARE_PROVIDER_SITE_OTHER): Payer: Medicare HMO | Admitting: *Deleted

## 2019-06-28 DIAGNOSIS — I4819 Other persistent atrial fibrillation: Secondary | ICD-10-CM

## 2019-06-28 DIAGNOSIS — I495 Sick sinus syndrome: Secondary | ICD-10-CM | POA: Diagnosis not present

## 2019-06-29 LAB — CUP PACEART REMOTE DEVICE CHECK
Battery Remaining Longevity: 121 mo
Battery Remaining Percentage: 95.5 %
Battery Voltage: 3.01 V
Brady Statistic AP VP Percent: 0 %
Brady Statistic AP VS Percent: 0 %
Brady Statistic AS VP Percent: 0 %
Brady Statistic AS VS Percent: 0 %
Brady Statistic RA Percent Paced: 1 %
Brady Statistic RV Percent Paced: 48 %
Date Time Interrogation Session: 20201109070023
Implantable Lead Implant Date: 20190711
Implantable Lead Implant Date: 20190711
Implantable Lead Location: 753859
Implantable Lead Location: 753860
Implantable Pulse Generator Implant Date: 20190711
Lead Channel Impedance Value: 560 Ohm
Lead Channel Impedance Value: 600 Ohm
Lead Channel Pacing Threshold Amplitude: 0.75 V
Lead Channel Pacing Threshold Amplitude: 1 V
Lead Channel Pacing Threshold Pulse Width: 0.4 ms
Lead Channel Pacing Threshold Pulse Width: 0.5 ms
Lead Channel Sensing Intrinsic Amplitude: 1.2 mV
Lead Channel Sensing Intrinsic Amplitude: 12 mV
Lead Channel Setting Pacing Amplitude: 1.75 V
Lead Channel Setting Pacing Amplitude: 2.5 V
Lead Channel Setting Pacing Pulse Width: 0.4 ms
Lead Channel Setting Sensing Sensitivity: 2 mV
Pulse Gen Model: 2272
Pulse Gen Serial Number: 9041042

## 2019-07-12 DIAGNOSIS — E7800 Pure hypercholesterolemia, unspecified: Secondary | ICD-10-CM | POA: Diagnosis not present

## 2019-07-12 DIAGNOSIS — I10 Essential (primary) hypertension: Secondary | ICD-10-CM | POA: Diagnosis not present

## 2019-07-12 DIAGNOSIS — M1990 Unspecified osteoarthritis, unspecified site: Secondary | ICD-10-CM | POA: Diagnosis not present

## 2019-07-12 DIAGNOSIS — J449 Chronic obstructive pulmonary disease, unspecified: Secondary | ICD-10-CM | POA: Diagnosis not present

## 2019-07-12 DIAGNOSIS — I480 Paroxysmal atrial fibrillation: Secondary | ICD-10-CM | POA: Diagnosis not present

## 2019-07-12 DIAGNOSIS — I4891 Unspecified atrial fibrillation: Secondary | ICD-10-CM | POA: Diagnosis not present

## 2019-07-12 DIAGNOSIS — E039 Hypothyroidism, unspecified: Secondary | ICD-10-CM | POA: Diagnosis not present

## 2019-07-28 NOTE — Progress Notes (Signed)
Remote pacemaker transmission.   

## 2019-08-19 ENCOUNTER — Other Ambulatory Visit: Payer: Self-pay

## 2019-08-19 ENCOUNTER — Encounter: Payer: Self-pay | Admitting: Physician Assistant

## 2019-08-19 ENCOUNTER — Ambulatory Visit (INDEPENDENT_AMBULATORY_CARE_PROVIDER_SITE_OTHER): Payer: Medicare HMO | Admitting: Physician Assistant

## 2019-08-19 VITALS — BP 140/76 | HR 95 | Temp 96.8°F | Ht 65.0 in | Wt 229.6 lb

## 2019-08-19 DIAGNOSIS — E039 Hypothyroidism, unspecified: Secondary | ICD-10-CM

## 2019-08-19 DIAGNOSIS — E782 Mixed hyperlipidemia: Secondary | ICD-10-CM

## 2019-08-19 DIAGNOSIS — J449 Chronic obstructive pulmonary disease, unspecified: Secondary | ICD-10-CM | POA: Diagnosis not present

## 2019-08-19 DIAGNOSIS — R072 Precordial pain: Secondary | ICD-10-CM | POA: Diagnosis not present

## 2019-08-19 DIAGNOSIS — I10 Essential (primary) hypertension: Secondary | ICD-10-CM | POA: Diagnosis not present

## 2019-08-19 DIAGNOSIS — D6851 Activated protein C resistance: Secondary | ICD-10-CM | POA: Diagnosis not present

## 2019-08-19 DIAGNOSIS — I480 Paroxysmal atrial fibrillation: Secondary | ICD-10-CM | POA: Diagnosis not present

## 2019-08-19 NOTE — Progress Notes (Signed)
Cardiology Office Note:    Date:  08/21/2019   ID:  Carly Romero, DOB Oct 05, 1945, MRN [id number]  PCP:  Aretta Nip, MD  Cardiologist:  Peter Martinique, MD  Electrophysiologist:  [name], MD   Referring MD: Aretta Nip, MD   Chief Complaint  Patient presents with  . Follow-up    seen for [name]    History of Present Illness:    Carly Romero is a 73 y.o. female with a hx of PAF s/p RFA in May 2016 and again in January 2017 at [facility], COPD, hypertension, hyperlipidemia, DVTs, factor V Leiden, and hypothyroidism.  She has failed Tikosyn and has been intolerant of amiodarone.  She eventually underwent pacemaker implantation and was followed by Dr. Lovena Le.  She was evaluated by Dr. Lamonte Sakai and had a PFT that showed severe COPD.  She did appear to have significant bronchodilator response.  Patient developed atrial fibrillation in December 2019 and was initially seen in the A. fib clinic and converted to sinus rhythm.  Patient was seen by [name] in March 2020, she was seen persistent atrial fibrillation at the time.  To avoid bronchospasm, her metoprolol was decreased and she was started on Cardizem.  During the last virtual visit in June 2020, she was noted to have very good rate control on the combination of beta-blocker and calcium channel blocker.  She was continued on the therapy.  Last echocardiogram obtained on 02/01/2019 showed EF 55 to 60%, inferior basal hypokinesis, trivial AI.  Last remote device check on 07/01/2019 showed normal functioning pacemaker.  Patient presents today for cardiology office visit.  She is doing well at home and has not had any acute COPD exacerbation recently.  For the past several weeks, she has been having this right-sided chest pain that sometimes can last up to an hour.  The pain is not exacerbated by exertion and only occurs at rest.  She denies any exacerbating factors such as deep inspiration, body rotation or palpation.  EKG obtained  today does show significantly worsening T wave inversion in the lateral leads and also new T wave inversions in the inferior leads.  I recommend a Lexiscan Myoview to make sure she is not having angina.  If Myoview is normal, she can follow-up with [name] in 6 months.  Otherwise her heart rate is well controlled on the current therapy.   Past Medical History:  Diagnosis Date  . Atrial fibrillation ([place])   . Factor V Leiden ([place])   . Hyperlipidemia   . Hypertension   . Hypertensive heart disease   . Hypothyroidism   . Obesity (BMI 30-39.9)   . Thyroid disease     Past Surgical History:  Procedure Laterality Date  . ATRIAL FIBRILLATION ABLATION  12/2014   Dr Glennon Mac at [facility]  . ATRIAL FIBRILLATION ABLATION  08/2015   Dr Glennon Mac at [facility]  . CARDIOVERSION N/A 05/26/2014   Procedure: CARDIOVERSION;  Surgeon: Jacolyn Reedy, MD;  Location: [facility] ENDOSCOPY;  Service: Cardiovascular;  Laterality: N/A;  . CARDIOVERSION N/A 09/14/2014   Procedure: CARDIOVERSION;  Surgeon: Jacolyn Reedy, MD;  Location: [facility] ENDOSCOPY;  Service: Cardiovascular;  Laterality: N/A;  . CARDIOVERSION N/A 05/11/2015   Procedure: CARDIOVERSION;  Surgeon: Jacolyn Reedy, MD;  Location: [place];  Service: Cardiovascular;  Laterality: N/A;  . COSMETIC SURGERY     lower lids  . DENTAL SURGERY     wisdom teeth  . PACEMAKER IMPLANT N/A 02/26/2018   Procedure: PACEMAKER  IMPLANT;  Surgeon: Evans Lance, MD;  Location: [place] CV LAB;  Service: Cardiovascular;  Laterality: N/A;    Current Medications: Current Meds  Medication Sig  . albuterol (VENTOLIN HFA) 108 (90 Base) MCG/ACT inhaler Inhale 2 puffs into the lungs every 6 (six) hours as needed for wheezing or shortness of breath.  Marland Kitchen BIOTIN PO Take 1 tablet by mouth daily.  . Calcium Carbonate-Vitamin D (CALCIUM + D PO) Take 1 tablet by mouth daily.  Marland Kitchen diltiazem (CARDIZEM CD) 240 MG 24 hr capsule Take 1 capsule (240 mg total) by mouth daily.  Marland Kitchen ELIQUIS 5  MG TABS tablet TAKE 1 TABLET TWICE A DAY  . FLUoxetine (PROZAC) 20 MG capsule Take 20 mg by mouth daily.  . furosemide (LASIX) 40 MG tablet Take 1 tablet (40 mg total) by mouth daily.  Marland Kitchen levothyroxine (SYNTHROID, LEVOTHROID) 100 MCG tablet Take 100 mcg by mouth daily before breakfast.   . loratadine (CLARITIN) 10 MG tablet Take 10 mg by mouth daily as needed for allergies.   Marland Kitchen losartan (COZAAR) 50 MG tablet Take 1 tablet (50 mg total) by mouth daily.  . metoprolol tartrate (LOPRESSOR) 50 MG tablet Take 1 tablet (50 mg total) by mouth 2 (two) times daily.  . Multiple Vitamin (MULTIVITAMIN WITH MINERALS) TABS tablet Take 1 tablet by mouth daily.  Vladimir Faster Glycol-Propyl Glycol (SYSTANE OP) Place 1 drop into both eyes daily as needed (dry eyes).  . potassium chloride (K-DUR) 10 MEQ tablet Take 1 tablet (10 mEq total) by mouth daily.  . rosuvastatin (CRESTOR) 20 MG tablet Take 20 mg by mouth daily.  . Tiotropium Bromide-Olodaterol (STIOLTO RESPIMAT) 2.5-2.5 MCG/ACT AERS Inhale 2 puffs into the lungs daily.  . [DISCONTINUED] FLUoxetine (PROZAC) 20 MG tablet Take 20 mg by mouth daily.     Allergies:   Amiodarone, Chantix [varenicline], Codeine, and Lipitor [atorvastatin]   Social History   Socioeconomic History  . Marital status: Single    Spouse name: Not on file  . Number of children: Not on file  . Years of education: Not on file  . Highest education level: Not on file  Occupational History  . Not on file  Tobacco Use  . Smoking status: Former Smoker    Packs/day: 1.00    Years: 40.00    Pack years: 40.00    Quit date: 12/11/2013    Years since quitting: 5.6  . Smokeless tobacco: Never Used  Substance and Sexual Activity  . Alcohol use: Yes    Alcohol/week: 8.0 standard drinks    Types: 8 Glasses of wine per week  . Drug use: No  . Sexual activity: Never  Other Topics Concern  . Not on file  Social History Narrative  . Not on file   Social Determinants of Health    Financial Resource Strain:   . Difficulty of Paying Living Expenses: Not on file  Food Insecurity:   . Worried About Charity fundraiser in the Last Year: Not on file  . Ran Out of Food in the Last Year: Not on file  Transportation Needs:   . Lack of Transportation (Medical): Not on file  . Lack of Transportation (Non-Medical): Not on file  Physical Activity:   . Days of Exercise per Week: Not on file  . Minutes of Exercise per Session: Not on file  Stress:   . Feeling of Stress : Not on file  Social Connections:   . Frequency of Communication with Friends and Family:  Not on file  . Frequency of Social Gatherings with Friends and Family: Not on file  . Attends Religious Services: Not on file  . Active Member of Clubs or Organizations: Not on file  . Attends Archivist Meetings: Not on file  . Marital Status: Not on file     Family History: The patient's family history includes Diabetes in her brother.  ROS:   Please see the history of present illness.    All other systems reviewed and are negative.  EKGs/Labs/Other Studies Reviewed:    The following studies were reviewed today:  Echo 02/01/2019 IMPRESSIONS  1. The left ventricle has normal systolic function, with an ejection fraction of 55-60%. The cavity size was normal. There is severely increased left ventricular wall thickness. Left ventricular diastolic Doppler parameters are indeterminate.  2. Inferior basal hypokinesis but overall EF preserved.  3. The right ventricle has normal systolic function. The cavity was normal. There is no increase in right ventricular wall thickness.  4. Left atrial size was moderately dilated.  5. Moderate thickening of the mitral valve leaflet. Mild calcification of the mitral valve leaflet. There is moderate mitral annular calcification present.  6. The aortic valve was not well visualized. Moderate thickening of the aortic valve. Sclerosis without any evidence of stenosis of  the aortic valve. Aortic valve regurgitation is trivial by color flow Doppler.  EKG:  EKG is ordered today.  The ekg ordered today demonstrates atrial fibrillation, T wave inversion in inferolateral leads, PVCs.  Recent Labs: 02/01/2019: BUN 20; Creatinine, Ser 1.36; Potassium 4.2; Sodium 141  Recent Lipid Panel    Component Value Date/Time   CHOL 208 (H) 01/14/2018 1009   TRIG 164 (H) 01/14/2018 1009   HDL 73 01/14/2018 1009   CHOLHDL 2.8 01/14/2018 1009   LDLCALC 102 (H) 01/14/2018 1009    Physical Exam:    VS:  BP 140/76   Pulse 95   Temp (!) 96.8 F (36 C)   Ht 5\' 5"  (1.651 m)   Wt 229 lb 9.6 oz (104.1 kg)   SpO2 97%   BMI 38.21 kg/m     Wt Readings from Last 3 Encounters:  08/19/19 229 lb 9.6 oz (104.1 kg)  04/19/19 220 lb (99.8 kg)  02/05/19 218 lb (98.9 kg)     GEN:  Well nourished, well developed in no acute distress HEENT: Normal NECK: No JVD; No carotid bruits LYMPHATICS: No lymphadenopathy CARDIAC: RRR, no murmurs, rubs, gallops RESPIRATORY:  Clear to auscultation without rales, wheezing or rhonchi  ABDOMEN: Soft, non-tender, non-distended MUSCULOSKELETAL:  No edema; No deformity  SKIN: Warm and dry NEUROLOGIC:  Alert and oriented x 3 PSYCHIATRIC:  Normal affect   ASSESSMENT:    1. Precordial pain   2. PAF (paroxysmal atrial fibrillation) ([place])   3. Essential hypertension   4. Mixed hyperlipidemia   5. Factor V Leiden ([place])   6. Hypothyroidism, unspecified type   7. Chronic obstructive pulmonary disease, unspecified COPD type ([place])    PLAN:    In order of problems listed above:  1. Precordial chest pain: Although her right-sided chest pain somewhat atypical and it does not occur with physical exertion.  However today's EKG showed drastic changes involving T wave inversion in the inferolateral leads.  I recommend a Lexiscan Myoview  2. Persistent atrial fibrillation: She has failed to ablation in the past.  She is on combination of beta-blocker  and the diltiazem.  Continue Eliquis.  Rate controlled  3. Hypertension:  Blood pressure borderline controlled  4. Hyperlipidemia: On Crestor  5. Factor V Leiden mutation: On Eliquis  6. Hypothyroidism: On Synthroid  7. COPD: No acute exacerbation.   Medication Adjustments/Labs and Tests Ordered: Current medicines are reviewed at length with the patient today.  Concerns regarding medicines are outlined above.  Orders Placed This Encounter  Procedures  . Myocardial Perfusion Imaging  . EKG 12-Lead   No orders of the defined types were placed in this encounter.   Patient Instructions  Medication Instructions:  Your physician recommends that you continue on your current medications as directed. Please refer to the Current Medication list given to you today.  *If you need a refill on your cardiac medications before your next appointment, please call your pharmacy*   Testing/Procedures: Your physician has requested that you have a lexiscan myoview. For further information please visit [email or web address]. Please follow instruction sheet, as given.   Follow-Up: At [facility], you and your health needs are our priority.  As part of our continuing mission to provide you with exceptional heart care, we have created designated Provider Care Teams.  These Care Teams include your primary Cardiologist (physician) and Advanced Practice Providers (APPs -  Physician Assistants and Nurse Practitioners) who all work together to provide you with the care you need, when you need it.  Your next appointment:   6 month(s)  The format for your next appointment:   Either In Person or Virtual  Provider:   You may see Peter Martinique, MD or one of the following Advanced Practice Providers on your designated Care Team:    Almyra Deforest, PA-C  Fabian Sharp, PA-C or   Roby Lofts, PA-C        Signed, [place], [place]  08/21/2019 9:48 PM    [place]

## 2019-08-19 NOTE — Patient Instructions (Signed)
Medication Instructions:  Your physician recommends that you continue on your current medications as directed. Please refer to the Current Medication list given to you today.  *If you need a refill on your cardiac medications before your next appointment, please call your pharmacy*   Testing/Procedures: Your physician has requested that you have a lexiscan myoview. For further information please visit [email or web address]. Please follow instruction sheet, as given.   Follow-Up: At [facility], you and your health needs are our priority.  As part of our continuing mission to provide you with exceptional heart care, we have created designated Provider Care Teams.  These Care Teams include your primary Cardiologist (physician) and Advanced Practice Providers (APPs -  Physician Assistants and Nurse Practitioners) who all work together to provide you with the care you need, when you need it.  Your next appointment:   6 month(s)  The format for your next appointment:   Either In Person or Virtual  Provider:   You may see Peter Martinique, MD or one of the following Advanced Practice Providers on your designated Care Team:    Almyra Deforest, PA-C  Fabian Sharp, PA-C or   Roby Lofts, [place]

## 2019-08-21 ENCOUNTER — Encounter: Payer: Self-pay | Admitting: Physician Assistant

## 2019-08-27 ENCOUNTER — Other Ambulatory Visit: Payer: Self-pay | Admitting: *Deleted

## 2019-08-27 MED ORDER — LOSARTAN POTASSIUM 50 MG PO TABS: 50.0000 mg | ORAL_TABLET | Freq: Every day | ORAL | 3 refills | Status: DC

## 2019-09-06 ENCOUNTER — Telehealth: Payer: Self-pay

## 2019-09-06 NOTE — Telephone Encounter (Signed)
Received a PA request for [employer]. CMM Key: [id number] Upon reviewing patient's chart, it looks like she was switched from [place] to Trelegy in 05/2019.   Called pharmacy and it appears that Trelegy has never been dispensed to pt d/t high copay, and last rx for Stiolto was dispensed 06/27/2019 for a 90 day supply. Attempted to initiate PA for Stiolto, but was unable to initiate PA d/t conflicting insurance info between what's in pt's chart and what's on file at [facility].  LMTCB X1 for pt to verify insurance info and to verify that she is in fact using Stiolto instead of Trelegy daily.   Routing to [place] for follow-up.

## 2019-09-15 DIAGNOSIS — M85851 Other specified disorders of bone density and structure, right thigh: Secondary | ICD-10-CM | POA: Diagnosis not present

## 2019-09-15 DIAGNOSIS — M85852 Other specified disorders of bone density and structure, left thigh: Secondary | ICD-10-CM | POA: Diagnosis not present

## 2019-09-15 DIAGNOSIS — Z1231 Encounter for screening mammogram for malignant neoplasm of breast: Secondary | ICD-10-CM | POA: Diagnosis not present

## 2019-09-21 ENCOUNTER — Encounter (HOSPITAL_COMMUNITY): Payer: Medicare HMO

## 2019-09-21 ENCOUNTER — Ambulatory Visit (HOSPITAL_COMMUNITY)
Admission: RE | Admit: 2019-09-21 | Payer: Medicare HMO | Source: Ambulatory Visit | Attending: Physician Assistant | Admitting: Physician Assistant

## 2019-09-22 ENCOUNTER — Ambulatory Visit (HOSPITAL_COMMUNITY)
Admission: RE | Admit: 2019-09-22 | Payer: Medicare HMO | Source: Ambulatory Visit | Attending: Physician Assistant | Admitting: Physician Assistant

## 2019-09-22 ENCOUNTER — Ambulatory Visit (HOSPITAL_COMMUNITY): Payer: Medicare HMO

## 2019-09-27 ENCOUNTER — Ambulatory Visit (INDEPENDENT_AMBULATORY_CARE_PROVIDER_SITE_OTHER): Payer: Medicare HMO | Admitting: *Deleted

## 2019-09-27 DIAGNOSIS — I495 Sick sinus syndrome: Secondary | ICD-10-CM

## 2019-09-27 LAB — CUP PACEART REMOTE DEVICE CHECK
Battery Remaining Longevity: 121 mo
Battery Remaining Percentage: 95.5 %
Battery Voltage: 3.01 V
Brady Statistic AP VP Percent: 0 %
Brady Statistic AP VS Percent: 0 %
Brady Statistic AS VP Percent: 0 %
Brady Statistic AS VS Percent: 0 %
Brady Statistic RA Percent Paced: 1 %
Brady Statistic RV Percent Paced: 49 %
Date Time Interrogation Session: 20210208020039
Implantable Lead Implant Date: 20190711
Implantable Lead Implant Date: 20190711
Implantable Lead Location: 753859
Implantable Lead Location: 753860
Implantable Pulse Generator Implant Date: 20190711
Lead Channel Impedance Value: 550 Ohm
Lead Channel Impedance Value: 560 Ohm
Lead Channel Pacing Threshold Amplitude: 0.75 V
Lead Channel Pacing Threshold Amplitude: 1 V
Lead Channel Pacing Threshold Pulse Width: 0.4 ms
Lead Channel Pacing Threshold Pulse Width: 0.5 ms
Lead Channel Sensing Intrinsic Amplitude: 1.2 mV
Lead Channel Sensing Intrinsic Amplitude: 12 mV
Lead Channel Setting Pacing Amplitude: 1.75 V
Lead Channel Setting Pacing Amplitude: 2.5 V
Lead Channel Setting Pacing Pulse Width: 0.4 ms
Lead Channel Setting Sensing Sensitivity: 2 mV
Pulse Gen Model: 2272
Pulse Gen Serial Number: 9041042

## 2019-09-28 DIAGNOSIS — C44629 Squamous cell carcinoma of skin of left upper limb, including shoulder: Secondary | ICD-10-CM | POA: Diagnosis not present

## 2019-09-28 NOTE — Progress Notes (Signed)
PPM Remote  

## 2019-10-02 ENCOUNTER — Ambulatory Visit: Payer: Medicare HMO | Attending: Internal Medicine

## 2019-10-02 DIAGNOSIS — Z23 Encounter for immunization: Secondary | ICD-10-CM | POA: Insufficient documentation

## 2019-10-02 NOTE — Progress Notes (Signed)
   Covid-19 Vaccination Clinic  Name:  Carly Romero    MRN: [id number] DOB: 07-Oct-1945  10/02/2019  Carly Romero was observed post Covid-19 immunization for 15 minutes without incidence. She was provided with Vaccine Information Sheet and instruction to access the V-Safe system.   Carly Romero was instructed to call 911 with any severe reactions post vaccine: Marland Kitchen Difficulty breathing  . Swelling of your face and throat  . A fast heartbeat  . A bad rash all over your body  . Dizziness and weakness    Immunizations Administered    Name Date Dose VIS Date Route   Pfizer COVID-19 Vaccine 10/02/2019  1:38 PM 0.3 mL 07/30/2019 Intramuscular   Manufacturer: [place]   Lot: [id number]   [place]: [id number]

## 2019-10-11 DIAGNOSIS — I4891 Unspecified atrial fibrillation: Secondary | ICD-10-CM | POA: Diagnosis not present

## 2019-10-11 DIAGNOSIS — H353131 Nonexudative age-related macular degeneration, bilateral, early dry stage: Secondary | ICD-10-CM | POA: Diagnosis not present

## 2019-10-11 DIAGNOSIS — I480 Paroxysmal atrial fibrillation: Secondary | ICD-10-CM | POA: Diagnosis not present

## 2019-10-11 DIAGNOSIS — H34811 Central retinal vein occlusion, right eye, with macular edema: Secondary | ICD-10-CM | POA: Diagnosis not present

## 2019-10-11 DIAGNOSIS — E039 Hypothyroidism, unspecified: Secondary | ICD-10-CM | POA: Diagnosis not present

## 2019-10-11 DIAGNOSIS — M1990 Unspecified osteoarthritis, unspecified site: Secondary | ICD-10-CM | POA: Diagnosis not present

## 2019-10-11 DIAGNOSIS — E7800 Pure hypercholesterolemia, unspecified: Secondary | ICD-10-CM | POA: Diagnosis not present

## 2019-10-11 DIAGNOSIS — H26493 Other secondary cataract, bilateral: Secondary | ICD-10-CM | POA: Diagnosis not present

## 2019-10-11 DIAGNOSIS — H04123 Dry eye syndrome of bilateral lacrimal glands: Secondary | ICD-10-CM | POA: Diagnosis not present

## 2019-10-11 DIAGNOSIS — I10 Essential (primary) hypertension: Secondary | ICD-10-CM | POA: Diagnosis not present

## 2019-10-11 DIAGNOSIS — J449 Chronic obstructive pulmonary disease, unspecified: Secondary | ICD-10-CM | POA: Diagnosis not present

## 2019-10-14 ENCOUNTER — Telehealth: Payer: Self-pay

## 2019-10-14 NOTE — Telephone Encounter (Signed)
Spoke to patient about a message I received that you did not want to schedule stress test.Stated she is no longer having chest pain.She wanted to discuss with Dr.Jordan first.Appointment scheduled with Dr.Jordan 3/25 at 11:00 am.

## 2019-10-25 ENCOUNTER — Ambulatory Visit: Payer: Medicare HMO | Attending: Internal Medicine

## 2019-10-25 ENCOUNTER — Telehealth (HOSPITAL_COMMUNITY): Payer: Self-pay | Admitting: Physician Assistant

## 2019-10-25 DIAGNOSIS — Z23 Encounter for immunization: Secondary | ICD-10-CM | POA: Insufficient documentation

## 2019-10-25 NOTE — Progress Notes (Addendum)
[facility] Note  10/26/2019     CHIEF COMPLAINT Patient presents for Retina Evaluation   HISTORY OF PRESENT ILLNESS: Carly Romero is a 74 y.o. female who presents to the clinic today for:   HPI    Retina Evaluation    In right eye.  This started 3 months ago.  Duration of 3 months.  Associated Symptoms Floaters.  Context:  distance vision and near vision.  Treatments tried include artificial tears.  Response to treatment was no improvement.          Comments    74 y/o female pt referred by Dr. Kathlen Mody on 2.22.21 for eval of CRVO w/edema OD.  VA OD has been weaker than OS since cat sx OU 2 yrs ago.  Pt noticed some "thread like" floaters OD for just a few minutes once about 3 mos ago, but otherwise had no idea anything was wrong.  VA blurred OD; good OS cc.  Denies pain, flashes, current floaters.  OD has slight FBS.  Systane QID OU, FML TID OD       Last edited by Matthew Folks, COA on 10/26/2019  2:01 PM. (History)     Patient states vision blurred OD since cataract surgery OU (2018 or 2019) with Dr. Valetta Close. Patient was not happy with vision OD following surgery. Vision eventually improved slightly OD, but about 2- 3 months ago, started to see "thread like" floaters OD. Patient is on blood thinner for a.fib and factor-5 leiden. Patient also has pacemaker. Has had carotid studies done in the past, which were normal.    Referring physician: Hortencia Pilar, MD [place],  [place] [address]  HISTORICAL INFORMATION:   Selected notes from the MEDICAL RECORD NUMBER Referral from Dr. Kathlen Mody for eval of CRVO w/edema OD. Pseudo OU, Dry ARMD OU, ABMD OU Systane QID OU VA cc OD: 20/80 OS: 20/30 IOP 14,15 Wearing Rx - OD: -0.75 sph OS: -0.50 sph MRx - OD: -0.75 sph 20/80 OS: -0.25+0.50x080 20/25-2     CURRENT MEDICATIONS: Current Outpatient Medications (Ophthalmic Drugs)  Medication Sig  . fluorometholone (FML) 0.1 %  ophthalmic suspension   . Polyethyl Glycol-Propyl Glycol (SYSTANE OP) Place 1 drop into both eyes daily as needed (dry eyes).   No current facility-administered medications for this visit. (Ophthalmic Drugs)   Current Outpatient Medications (Other)  Medication Sig  . albuterol (VENTOLIN HFA) 108 (90 Base) MCG/ACT inhaler Inhale 2 puffs into the lungs every 6 (six) hours as needed for wheezing or shortness of breath.  Marland Kitchen BIOTIN PO Take 1 tablet by mouth daily.  . Calcium Carbonate-Vitamin D (CALCIUM + D PO) Take 1 tablet by mouth daily.  Marland Kitchen diltiazem (CARDIZEM CD) 240 MG 24 hr capsule Take 1 capsule (240 mg total) by mouth daily.  Marland Kitchen ELIQUIS 5 MG TABS tablet TAKE 1 TABLET TWICE A DAY  . FLUoxetine (PROZAC) 20 MG capsule Take 20 mg by mouth daily.  Marland Kitchen levothyroxine (SYNTHROID, LEVOTHROID) 100 MCG tablet Take 100 mcg by mouth daily before breakfast.   . loratadine (CLARITIN) 10 MG tablet Take 10 mg by mouth daily as needed for allergies.   Marland Kitchen losartan (COZAAR) 50 MG tablet Take 1 tablet (50 mg total) by mouth daily.  . metoprolol tartrate (LOPRESSOR) 50 MG tablet Take 1 tablet (50 mg total) by mouth 2 (two) times daily.  . Multiple Vitamin (MULTIVITAMIN WITH MINERALS) TABS tablet Take 1 tablet by mouth daily.  Marland Kitchen  potassium chloride (K-DUR) 10 MEQ tablet Take 1 tablet (10 mEq total) by mouth daily.  . rosuvastatin (CRESTOR) 20 MG tablet Take 20 mg by mouth daily.  Marland Kitchen SHINGRIX injection   . Tiotropium Bromide-Olodaterol (STIOLTO RESPIMAT) 2.5-2.5 MCG/ACT AERS Inhale 2 puffs into the lungs daily.  . furosemide (LASIX) 40 MG tablet Take 1 tablet (40 mg total) by mouth daily.   No current facility-administered medications for this visit. (Other)      REVIEW OF SYSTEMS: ROS    Positive for: Cardiovascular, Eyes, Respiratory   Negative for: Constitutional, Gastrointestinal, Neurological, Skin, Genitourinary, Musculoskeletal, HENT, Endocrine, Psychiatric, Allergic/Imm, Heme/Lymph   Last edited by  Matthew Folks, COA on 10/26/2019  1:34 PM. (History)       ALLERGIES Allergies  Allergen Reactions  . Amiodarone Other (See Comments)    alopecia  . Chantix [Varenicline]     Suicidal thoughts  . Codeine Swelling    Swelling as a young adult  . Lipitor [Atorvastatin] Diarrhea    And cramping.    PAST MEDICAL HISTORY Past Medical History:  Diagnosis Date  . Atrial fibrillation ([place])   . Factor V Leiden ([place])   . Hyperlipidemia   . Hypertension   . Hypertensive heart disease   . Hypothyroidism   . Macular degeneration    Dry OU  . Obesity (BMI 30-39.9)   . Thyroid disease    Past Surgical History:  Procedure Laterality Date  . ATRIAL FIBRILLATION ABLATION  12/2014   Dr Glennon Mac at [facility]  . ATRIAL FIBRILLATION ABLATION  08/2015   Dr Glennon Mac at [facility]  . CARDIOVERSION N/A 05/26/2014   Procedure: CARDIOVERSION;  Surgeon: Jacolyn Reedy, MD;  Location: [facility] ENDOSCOPY;  Service: Cardiovascular;  Laterality: N/A;  . CARDIOVERSION N/A 09/14/2014   Procedure: CARDIOVERSION;  Surgeon: Jacolyn Reedy, MD;  Location: [facility] ENDOSCOPY;  Service: Cardiovascular;  Laterality: N/A;  . CARDIOVERSION N/A 05/11/2015   Procedure: CARDIOVERSION;  Surgeon: Jacolyn Reedy, MD;  Location: [facility];  Service: Cardiovascular;  Laterality: N/A;  . CATARACT EXTRACTION Bilateral   . COSMETIC SURGERY     lower lids  . DENTAL SURGERY     wisdom teeth  . EYE SURGERY Bilateral    Cat Sx OU  . PACEMAKER IMPLANT N/A 02/26/2018   Procedure: PACEMAKER IMPLANT;  Surgeon: Evans Lance, MD;  Location: [facility];  Service: Cardiovascular;  Laterality: N/A;    FAMILY HISTORY Family History  Problem Relation Age of Onset  . Diabetes Brother     SOCIAL HISTORY Social History   Tobacco Use  . Smoking status: Former Smoker    Packs/day: 1.00    Years: 40.00    Pack years: 40.00    Quit date: 12/11/2013    Years since quitting: 5.8  . Smokeless tobacco: Never Used  Substance Use  Topics  . Alcohol use: Yes    Alcohol/week: 8.0 standard drinks    Types: 8 Glasses of wine per week  . Drug use: No         OPHTHALMIC EXAM:  Base Eye Exam    Visual Acuity (Snellen - Linear)      Right Left   Dist cc 20/50 - 20/25 -2   Dist ph cc NI NI   Correction: Glasses       Tonometry (Tonopen, 2:01 PM)      Right Left   Pressure 14 11       Pupils      Dark  Light Shape React APD   Right 4 3 Round Brisk None   Left 4 3 Round Brisk None       Visual Fields (Counting fingers)      Left Right    Full Full       Extraocular Movement      Right Left    Full, Ortho Full, Ortho       Neuro/Psych    Oriented x3: Yes   Mood/Affect: Normal       Dilation    Both eyes: 1.0% Mydriacyl, 2.5% Phenylephrine @ 2:02 PM        Slit Lamp and Fundus Exam    Slit Lamp Exam      Right Left   Lids/Lashes Dermatochalasis - upper lid Dermatochalasis - upper lid   Conjunctiva/Sclera White and quiet White and quiet   Cornea 1+ PEE, mild arcus, well healed temporal cataract wound 1+ PEE, mild arcus, well healed temporal cataract wound   Anterior Chamber deep and clear deep and clear   Iris round and dilated round and dilated   Lens PC IOL in perfect position, trace PCO PC IOL in perfect position, trace PCO   Vitreous syneresis syneresis       Fundus Exam      Right Left   Disc Optic disc edema, 360 flame hemes, CWS superiorly compact, mild tilt, pink and sharp   C/D Ratio 0.0 0.4   Macula Blunted foveal reflex, central edema, scattered IRH/DBH flat, blunted foveal reflex, mild RPE mottling, trace drusen, no heme or edema   Vessels Tortuous, Dilated, +CRVO attenuated, tortuous, mild A/V crossing changes   Periphery Attached, 360 IRH/DBH, mild reticular degeneration attached, mild reticular degeneration        Refraction    Wearing Rx      Sphere Cylinder Add   Right -0.75 Sphere +2.50   Left -0.50 Sphere +2.50   Age: 47yrs   Type: PAL       Manifest  Refraction      Sphere Cylinder Axis Dist VA   Right -0.50 Sphere  20/50+2   Left -0.50 +0.50 080 20/20-          IMAGING AND PROCEDURES  Imaging and Procedures for @TODAY @  OCT, Retina - OU - Both Eyes       Right Eye Quality was good. Central Foveal Thickness: 573. Progression has no prior data. Findings include abnormal foveal contour, intraretinal fluid, subretinal fluid (Central CME and disc edema).   Left Eye Quality was good. Central Foveal Thickness: 247. Progression has no prior data. Findings include normal foveal contour, no IRF, no SRF (Trace drusen).   Notes *Images captured and stored on drive  Diagnosis / Impression:  OD: abnormal foveal contour, +IRF/SRF, central CME and disc edema -- CRVO w/ CME OS: NFP, no IRF/SRF  Clinical management:  See below  Abbreviations: NFP - Normal foveal profile. CME - cystoid macular edema. PED - pigment epithelial detachment. IRF - intraretinal fluid. SRF - subretinal fluid. EZ - ellipsoid zone. ERM - epiretinal membrane. ORA - outer retinal atrophy. ORT - outer retinal tubulation. SRHM - subretinal hyper-reflective material        Intravitreal Injection, Pharmacologic Agent - OD - Right Eye       Time Out 10/26/2019. 3:12 PM. Confirmed correct patient, procedure, site, and patient consented.   Anesthesia Topical anesthesia was used. Anesthetic medications included Lidocaine 2%, Proparacaine 0.5%.   Procedure Preparation included 5% betadine to ocular surface, eyelid  speculum. A supplied needle was used.   Injection:  1.25 mg Bevacizumab (AVASTIN) SOLN   NDC: [id number], Lot: [id number] , Expiration date: 12/08/2019   Route: Intravitreal, Site: Right Eye, Waste: 0 mL  Post-op Post injection exam found visual acuity of at least counting fingers. The patient tolerated the procedure well. There were no complications. The patient received written and verbal post procedure care education.                  ASSESSMENT/PLAN:    ICD-10-CM   1. Central retinal vein occlusion with macular edema of right eye  H34.8110 Intravitreal Injection, Pharmacologic Agent - OD - Right Eye    Bevacizumab (AVASTIN) SOLN 1.25 mg  2. Retinal edema  H35.81 OCT, Retina - OU - Both Eyes  3. Factor V Leiden ([place])  D68.51   4. Essential hypertension  I10   5. Hypertensive retinopathy of both eyes  H35.033   6. Pseudophakia of both eyes  Z96.1     1-3. CRVO with macular edema OD        History of Factor V Leiden on Eliquis  - pt reports history of blood clots / DVTs  - significant CV history with A fib s/p ablation and pacemaker implantation  - The natural history of retinal vein occlusion and macular edema and treatment options including observation, laser photocoagulation, and intravitreal antiVEGF injection with Avastin and Lucentis and Eylea and intravitreal injection of steroids with triamcinolone and Ozurdex and the complications of these procedures including loss of vision, infection, cataract, glaucoma, and retinal detachment were discussed with patient.  - Specifically discussed findings from [place] / [place] study regarding patient stabilization with anti-VEGF agents and increased potential for visual improvements.  Also discussed need for frequent follow up and potentially multiple injections given the chronic nature of the disease process  - OCT shows +IRF/SRF with central CME and disc edema  - BCVA is 20/50- today, 03.09.21  - recommend IVA OD #1 today, 03.09.21  - pt wishes to proceed  - RBA of procedure discussed, questions answered  - informed consent obtained and signed today, 3.9.21  - see procedure note  - return 4 weeks DFE, OCT, FA transit OD, possible injection  4,5. Hypertensive retinopathy OU  - discussed importance of tight BP control  - monitor  6. Pseudophakia OU  - s/p CE/IOL OU w/ Dr. 16.9.21  - IOLs in good position   - monitor   Ophthalmic Meds Ordered this visit:  Meds ordered  this encounter  Medications  . Bevacizumab (AVASTIN) SOLN 1.25 mg       Return in about 4 weeks (around 11/23/2019) for DFE, OCT, FA transit OD.  There are no Patient Instructions on file for this visit.   Explained the diagnoses, plan, and follow up with the patient and they expressed understanding.  Patient expressed understanding of the importance of proper follow up care.  This document serves as a record of services personally performed by 17/01/2020, MD, PhD. It was created on their behalf by Gardiner Sleeper, COMT. The creation of this record is the provider's dictation and/or activities during the visit.  Electronically signed by: Roselee Nova, COMT 10/26/19 4:10 PM   16/09/21, M.D., Ph.D. Diseases & Surgery of the Retina and Vitreous [facility]  I have reviewed the above documentation for accuracy and completeness, and I agree with the above. 3[facility], M.D., Ph.D. 10/26/19 4:10 PM    Abbreviations:  M myopia (nearsighted); A astigmatism; H hyperopia (farsighted); P presbyopia; Mrx spectacle prescription;  CTL contact lenses; OD right eye; OS left eye; OU both eyes  XT exotropia; ET esotropia; PEK punctate epithelial keratitis; PEE punctate epithelial erosions; DES dry eye syndrome; MGD meibomian gland dysfunction; ATs artificial tears; PFAT's preservative free artificial tears; [place] nuclear sclerotic cataract; PSC posterior subcapsular cataract; ERM epi-retinal membrane; PVD posterior vitreous detachment; RD retinal detachment; DM diabetes mellitus; DR diabetic retinopathy; NPDR non-proliferative diabetic retinopathy; PDR proliferative diabetic retinopathy; CSME clinically significant macular edema; DME diabetic macular edema; dbh dot blot hemorrhages; CWS cotton wool spot; POAG primary open angle glaucoma; C/D cup-to-disc ratio; HVF humphrey visual field; GVF goldmann visual field; OCT optical coherence tomography; IOP intraocular pressure;  BRVO Branch retinal vein occlusion; CRVO central retinal vein occlusion; CRAO central retinal artery occlusion; BRAO branch retinal artery occlusion; RT retinal tear; SB scleral buckle; PPV pars plana vitrectomy; VH Vitreous hemorrhage; PRP panretinal laser photocoagulation; IVK intravitreal kenalog; VMT vitreomacular traction; MH Macular hole;  NVD neovascularization of the disc; NVE neovascularization elsewhere; AREDS age related eye disease study; ARMD age related macular degeneration; POAG primary open angle glaucoma; EBMD epithelial/anterior basement membrane dystrophy; ACIOL anterior chamber intraocular lens; IOL intraocular lens; PCIOL posterior chamber intraocular lens; Phaco/IOL phacoemulsification with intraocular lens placement; [place] photorefractive keratectomy; LASIK laser assisted in situ keratomileusis; HTN hypertension; DM diabetes mellitus; COPD chronic obstructive pulmonary disease

## 2019-10-25 NOTE — Progress Notes (Signed)
   Covid-19 Vaccination Clinic  Name:  Carly Romero    MRN: [id number] DOB: [birth year]  10/25/2019  [name] was observed post Covid-19 immunization for 15 minutes without incident. She was provided with Vaccine Information Sheet and instruction to access the V-Safe system.   [name] was instructed to call 911 with any severe reactions post vaccine: Marland Kitchen Difficulty breathing  . Swelling of face and throat  . A fast heartbeat  . A bad rash all over body  . Dizziness and weakness   Immunizations Administered    Name Date Dose VIS Date Route   Pfizer COVID-19 Vaccine 10/25/2019 10:59 AM 0.3 mL 07/30/2019 Intramuscular   Manufacturer: [place]   Lot: [id number]   [place]: [id number]

## 2019-10-25 NOTE — Telephone Encounter (Signed)
Called to schedule Lexiscan and patient declined to schedule after several attempts to schedule. Order will be removed from the WQ.

## 2019-10-26 ENCOUNTER — Ambulatory Visit (INDEPENDENT_AMBULATORY_CARE_PROVIDER_SITE_OTHER): Payer: Medicare HMO | Admitting: Ophthalmology

## 2019-10-26 ENCOUNTER — Encounter (INDEPENDENT_AMBULATORY_CARE_PROVIDER_SITE_OTHER): Payer: Self-pay | Admitting: Ophthalmology

## 2019-10-26 DIAGNOSIS — D6851 Activated protein C resistance: Secondary | ICD-10-CM

## 2019-10-26 DIAGNOSIS — H34811 Central retinal vein occlusion, right eye, with macular edema: Secondary | ICD-10-CM | POA: Diagnosis not present

## 2019-10-26 DIAGNOSIS — H3581 Retinal edema: Secondary | ICD-10-CM | POA: Diagnosis not present

## 2019-10-26 DIAGNOSIS — I10 Essential (primary) hypertension: Secondary | ICD-10-CM | POA: Diagnosis not present

## 2019-10-26 DIAGNOSIS — Z961 Presence of intraocular lens: Secondary | ICD-10-CM

## 2019-10-26 DIAGNOSIS — H35033 Hypertensive retinopathy, bilateral: Secondary | ICD-10-CM

## 2019-10-26 MED ORDER — BEVACIZUMAB CHEMO INJECTION 1.25MG/0.05ML SYRINGE FOR KALEIDOSCOPE
1.2500 mg | INTRAVITREAL | Status: AC | PRN
  Administered 2019-10-26: 1.25 mg via INTRAVITREAL

## 2019-10-27 DIAGNOSIS — Z0000 Encounter for general adult medical examination without abnormal findings: Secondary | ICD-10-CM | POA: Diagnosis not present

## 2019-10-27 DIAGNOSIS — Z7901 Long term (current) use of anticoagulants: Secondary | ICD-10-CM | POA: Diagnosis not present

## 2019-10-27 DIAGNOSIS — E7800 Pure hypercholesterolemia, unspecified: Secondary | ICD-10-CM | POA: Diagnosis not present

## 2019-10-27 DIAGNOSIS — I10 Essential (primary) hypertension: Secondary | ICD-10-CM | POA: Diagnosis not present

## 2019-10-27 DIAGNOSIS — G4730 Sleep apnea, unspecified: Secondary | ICD-10-CM | POA: Diagnosis not present

## 2019-10-27 DIAGNOSIS — J449 Chronic obstructive pulmonary disease, unspecified: Secondary | ICD-10-CM | POA: Diagnosis not present

## 2019-10-27 DIAGNOSIS — D6851 Activated protein C resistance: Secondary | ICD-10-CM | POA: Diagnosis not present

## 2019-10-27 DIAGNOSIS — E039 Hypothyroidism, unspecified: Secondary | ICD-10-CM | POA: Diagnosis not present

## 2019-10-27 DIAGNOSIS — I4891 Unspecified atrial fibrillation: Secondary | ICD-10-CM | POA: Diagnosis not present

## 2019-10-27 DIAGNOSIS — Z7189 Other specified counseling: Secondary | ICD-10-CM | POA: Diagnosis not present

## 2019-10-28 DIAGNOSIS — E7800 Pure hypercholesterolemia, unspecified: Secondary | ICD-10-CM | POA: Diagnosis not present

## 2019-10-28 DIAGNOSIS — M1990 Unspecified osteoarthritis, unspecified site: Secondary | ICD-10-CM | POA: Diagnosis not present

## 2019-10-28 DIAGNOSIS — E039 Hypothyroidism, unspecified: Secondary | ICD-10-CM | POA: Diagnosis not present

## 2019-10-28 DIAGNOSIS — I480 Paroxysmal atrial fibrillation: Secondary | ICD-10-CM | POA: Diagnosis not present

## 2019-10-28 DIAGNOSIS — I10 Essential (primary) hypertension: Secondary | ICD-10-CM | POA: Diagnosis not present

## 2019-10-28 DIAGNOSIS — J449 Chronic obstructive pulmonary disease, unspecified: Secondary | ICD-10-CM | POA: Diagnosis not present

## 2019-10-28 DIAGNOSIS — I4891 Unspecified atrial fibrillation: Secondary | ICD-10-CM | POA: Diagnosis not present

## 2019-11-08 NOTE — Progress Notes (Signed)
Cardiology Office Note:    Date:  11/11/2019   ID:  Carly Romero, DOB [birth year], MRN [id number]  PCP:  Aretta Nip, MD  Cardiologist:  Dustine Bertini [place], MD  Electrophysiologist:  [name], MD   Referring MD: Aretta Nip, MD   Chief Complaint  Patient presents with  . Atrial Fibrillation    History of Present Illness:    Carly Romero is a 74 y.o. female with a hx of PAF s/p RFA in May 2016 and again in January 2017 at [facility], COPD, hypertension, hyperlipidemia, DVTs, factor V Leiden, and hypothyroidism.  She has failed Tikosyn and has been intolerant of amiodarone.  She eventually underwent pacemaker implantation and was followed by Dr. Lovena Le.  She was evaluated by Dr. Lamonte Sakai and had a PFT that showed severe COPD.  She did appear to have significant bronchodilator response.  Patient developed atrial fibrillation in December 2019 and was initially seen in the A. fib clinic and converted to sinus rhythm.  Patient was seen by me in March 2020, she was seen persistent atrial fibrillation at the time.  To avoid bronchospasm, her metoprolol was decreased and she was started on Cardizem.    Last echocardiogram obtained on 02/01/2019 showed EF 55 to 60%, inferior basal hypokinesis, trivial AI.  Last remote device check on 09/27/19 showed normal functioning pacemaker with permanent Afib rate controlled. She was seen in November by Almyra Deforest PA-C. She was having atypical chest pain at that time. A Lexiscan myoview was ordered but the patient later declined since her pain had resolved.  On follow up today she is doing well. Not really aware of her Afib. No increase in edema. No chest pain. She is sedentary but did walk to her neighbor's house yesterday and was able to do this OK without dyspnea.     Past Medical History:  Diagnosis Date  . Atrial fibrillation ([place])   . Factor V Leiden ([place])   . Hyperlipidemia   . Hypertension   . Hypertensive heart disease   . Hypothyroidism   .  Macular degeneration    Dry OU  . Obesity (BMI 30-39.9)   . Thyroid disease     Past Surgical History:  Procedure Laterality Date  . ATRIAL FIBRILLATION ABLATION  12/2014   Dr Glennon Mac at [facility]  . ATRIAL FIBRILLATION ABLATION  08/2015   [facility] Glennon [facility]  . CARDIOVERSION N/A 05/26/2014   Procedure: CARDIOVERSION;  Surgeon: Jacolyn Reedy, MD;  Location: [facility] ENDOSCOPY;  Service: Cardiovascular;  Laterality: N/A;  . CARDIOVERSION N/A 09/14/2014   Procedure: CARDIOVERSION;  Surgeon: Jacolyn Reedy, MD;  Location: [facility] ENDOSCOPY;  Service: Cardiovascular;  Laterality: N/A;  . CARDIOVERSION N/A 05/11/2015   Procedure: CARDIOVERSION;  Surgeon: Jacolyn Reedy, MD;  Location: [place];  Service: Cardiovascular;  Laterality: N/A;  . CATARACT EXTRACTION Bilateral   . COSMETIC SURGERY     lower lids  . DENTAL SURGERY     wisdom teeth  . EYE SURGERY Bilateral    Cat Sx OU  . PACEMAKER IMPLANT N/A 02/26/2018   Procedure: PACEMAKER IMPLANT;  Surgeon: Evans Lance, MD;  Location: [place] [facility];  Service: Cardiovascular;  Laterality: N/A;    Current Medications: Current Meds  Medication Sig  . albuterol (VENTOLIN HFA) 108 (90 Base) MCG/ACT inhaler Inhale 2 puffs into the lungs every 6 (six) hours as needed for wheezing or shortness of breath.  Marland Kitchen BIOTIN PO Take 1 tablet by mouth daily.  Marland Kitchen  Calcium Carbonate-Vitamin D (CALCIUM + D PO) Take 1 tablet by mouth daily.  Marland Kitchen diltiazem (CARDIZEM CD) 240 MG 24 hr capsule Take 1 capsule (240 mg total) by mouth daily.  Marland Kitchen ELIQUIS 5 MG TABS tablet Take 1 tablet (5 mg total) by mouth 2 (two) times daily.  . fluorometholone (FML) 0.1 % ophthalmic suspension   . FLUoxetine (PROZAC) 20 MG capsule Take 20 mg by mouth daily.  Marland Kitchen levothyroxine (SYNTHROID, LEVOTHROID) 100 MCG tablet Take 150 mcg by mouth daily before breakfast.   . loratadine (CLARITIN) 10 MG tablet Take 10 mg by mouth daily as needed for allergies.   Marland Kitchen losartan (COZAAR) 50 MG tablet Take 1  tablet (50 mg total) by mouth daily.  . metoprolol tartrate (LOPRESSOR) 50 MG tablet Take 1 tablet (50 mg total) by mouth 2 (two) times daily.  . Multiple Vitamin (MULTIVITAMIN WITH MINERALS) TABS tablet Take 1 tablet by mouth daily.  Vladimir Faster Glycol-Propyl Glycol (SYSTANE OP) Place 1 drop into both eyes daily as needed (dry eyes).  . potassium chloride (K-DUR) 10 MEQ tablet Take 1 tablet (10 mEq total) by mouth daily.  . rosuvastatin (CRESTOR) 20 MG tablet Take 20 mg by mouth daily.  Marland Kitchen SHINGRIX injection   . Tiotropium Bromide-Olodaterol (STIOLTO RESPIMAT) 2.5-2.5 MCG/ACT AERS Inhale 2 puffs into the lungs daily.  . [DISCONTINUED] diltiazem (CARDIZEM CD) 240 MG 24 hr capsule Take 1 capsule (240 mg total) by mouth daily.  . [DISCONTINUED] ELIQUIS 5 MG TABS tablet TAKE 1 TABLET TWICE A DAY  . [DISCONTINUED] metoprolol tartrate (LOPRESSOR) 50 MG tablet Take 1 tablet (50 mg total) by mouth 2 (two) times daily.     Allergies:   Amiodarone, Chantix [varenicline], Codeine, and Lipitor [atorvastatin]   Social History   Socioeconomic History  . Marital status: Single    Spouse name: Not on file  . Number of children: Not on file  . Years of education: Not on file  . Highest education level: Not on file  Occupational History  . Not on file  Tobacco Use  . Smoking status: Former Smoker    Packs/day: 1.00    Years: 40.00    Pack years: 40.00    Quit date: 12/11/2013    Years since quitting: 5.9  . Smokeless tobacco: Never Used  Substance and Sexual Activity  . Alcohol use: Yes    Alcohol/week: 8.0 standard drinks    Types: 8 Glasses of wine per week  . Drug use: No  . Sexual activity: Never  Other Topics Concern  . Not on file  Social History Narrative  . Not on file   Social Determinants of Health   Financial Resource Strain:   . Difficulty of Paying Living Expenses:   Food Insecurity:   . Worried About Charity fundraiser in the Last Year:   . Arboriculturist in the Last  Year:   Transportation Needs:   . Film/video editor (Medical):   Marland Kitchen Lack of Transportation (Non-Medical):   Physical Activity:   . Days of Exercise per Week:   . Minutes of Exercise per Session:   Stress:   . Feeling of Stress :   Social Connections:   . Frequency of Communication with Friends and Family:   . Frequency of Social Gatherings with Friends and Family:   . Attends Religious Services:   . Active Member of Clubs or Organizations:   . Attends Archivist Meetings:   Marland Kitchen Marital Status:  Family History: The patient's family history includes Diabetes in her brother.  ROS:   Please see the history of present illness.    All other systems reviewed and are negative.  EKGs/Labs/Other Studies Reviewed:    The following studies were reviewed today:  Echo 02/01/2019 IMPRESSIONS  1. The left ventricle has normal systolic function, with an ejection fraction of 55-60%. The cavity size was normal. There is severely increased left ventricular wall thickness. Left ventricular diastolic Doppler parameters are indeterminate.  2. Inferior basal hypokinesis but overall EF preserved.  3. The right ventricle has normal systolic function. The cavity was normal. There is no increase in right ventricular wall thickness.  4. Left atrial size was moderately dilated.  5. Moderate thickening of the mitral valve leaflet. Mild calcification of the mitral valve leaflet. There is moderate mitral annular calcification present.  6. The aortic valve was not well visualized. Moderate thickening of the aortic valve. Sclerosis without any evidence of stenosis of the aortic valve. Aortic valve regurgitation is trivial by color flow Doppler.  EKG:  EKG is not ordered today.   Recent Labs: 02/01/2019: BUN 20; Creatinine, Ser 1.36; Potassium 4.2; Sodium 141  Recent Lipid Panel    Component Value Date/Time   CHOL 208 (H) 01/14/2018 1009   TRIG 164 (H) 01/14/2018 1009   HDL 73 01/14/2018 1009    CHOLHDL 2.8 01/14/2018 1009   LDLCALC 102 (H) 01/14/2018 1009   Dated 10/27/19: cholesterol 181, triglycerides 205, HDL 52, LDL 94. Creatinine 1.15. CMET otherwise normal. CBC normal.  Physical Exam:    VS:  BP 130/81   Pulse 84   Temp (!) 96.9 F (36.1 C)   Ht 5\' 5"  (1.651 m)   Wt 225 lb (102.1 kg)   SpO2 97%   BMI 37.44 kg/m     Wt Readings from Last 3 Encounters:  11/11/19 225 lb (102.1 kg)  08/19/19 229 lb 9.6 oz (104.1 kg)  04/19/19 220 lb (99.8 kg)     GEN:  Well nourished, well developed in no acute distress HEENT: Normal NECK: No JVD; No carotid bruits LYMPHATICS: No lymphadenopathy CARDIAC: RRR, no murmurs, rubs, gallops RESPIRATORY:  Clear to auscultation without rales, wheezing or rhonchi  ABDOMEN: Soft, non-tender, non-distended MUSCULOSKELETAL:  No edema; No deformity  SKIN: Warm and dry NEUROLOGIC:  Alert and oriented x 3 PSYCHIATRIC:  Normal affect   ASSESSMENT:    1. Atrial fibrillation, chronic ([place])   2. Mixed hyperlipidemia   3. Factor V Leiden ([place])   4. Chronic obstructive pulmonary disease, unspecified COPD type ([place])    PLAN:    In order of problems listed above:  1. Precordial chest pain: atypical. Resolved. Will forgo further testing for now.  2. Persistent atrial fibrillation: She has failed to ablation in the past. Managed with rate control strategy now   on combination of beta-blocker and the diltiazem.  Continue Eliquis.   3. Hypertension: Blood pressure is controlled  4. Hyperlipidemia: On Crestor  5. Factor V Leiden mutation: On Eliquis  6. Hypothyroidism: On Synthroid  7. COPD: No acute exacerbation.   Medication Adjustments/Labs and Tests Ordered: Current medicines are reviewed at length with the patient today.  Concerns regarding medicines are outlined above.  No orders of the defined types were placed in this encounter.  Meds ordered this encounter  Medications  . diltiazem (CARDIZEM CD) 240 MG 24 hr capsule     Sig: Take 1 capsule (240 mg total) by mouth daily.  Dispense:  90 capsule    Refill:  3  . ELIQUIS 5 MG TABS tablet    Sig: Take 1 tablet (5 mg total) by mouth 2 (two) times daily.    Dispense:  180 tablet    Refill:  3  . furosemide (LASIX) 40 MG tablet    Sig: Take 1 tablet (40 mg total) by mouth daily.    Dispense:  90 tablet    Refill:  3  . metoprolol tartrate (LOPRESSOR) 50 MG tablet    Sig: Take 1 tablet (50 mg total) by mouth 2 (two) times daily.    Dispense:  180 tablet    Refill:  3    There are no Patient Instructions on file for this visit.   Signed, Krishan Mcbreen [place], MD  11/11/2019 11:06 AM    [place]

## 2019-11-11 ENCOUNTER — Ambulatory Visit: Payer: Medicare HMO | Admitting: Cardiology

## 2019-11-11 ENCOUNTER — Encounter: Payer: Self-pay | Admitting: Cardiology

## 2019-11-11 ENCOUNTER — Other Ambulatory Visit: Payer: Self-pay

## 2019-11-11 VITALS — BP 130/81 | HR 84 | Temp 96.9°F | Ht 65.0 in | Wt 225.0 lb

## 2019-11-11 DIAGNOSIS — I482 Chronic atrial fibrillation, unspecified: Secondary | ICD-10-CM

## 2019-11-11 DIAGNOSIS — J449 Chronic obstructive pulmonary disease, unspecified: Secondary | ICD-10-CM

## 2019-11-11 DIAGNOSIS — E782 Mixed hyperlipidemia: Secondary | ICD-10-CM | POA: Diagnosis not present

## 2019-11-11 DIAGNOSIS — D6851 Activated protein C resistance: Secondary | ICD-10-CM

## 2019-11-11 MED ORDER — ELIQUIS 5 MG PO TABS: 5.0000 mg | ORAL_TABLET | Freq: Two times a day (BID) | ORAL | 3 refills | Status: DC

## 2019-11-11 MED ORDER — FUROSEMIDE 40 MG PO TABS: 40.0000 mg | ORAL_TABLET | Freq: Every day | ORAL | 3 refills | Status: DC

## 2019-11-11 MED ORDER — METOPROLOL TARTRATE 50 MG PO TABS: 50.0000 mg | ORAL_TABLET | Freq: Two times a day (BID) | ORAL | 3 refills | Status: DC

## 2019-11-11 MED ORDER — DILTIAZEM HCL ER COATED BEADS 240 MG PO CP24: 240.0000 mg | ORAL_CAPSULE | Freq: Every day | ORAL | 3 refills | Status: DC

## 2019-11-23 ENCOUNTER — Encounter (INDEPENDENT_AMBULATORY_CARE_PROVIDER_SITE_OTHER): Payer: Self-pay | Admitting: Ophthalmology

## 2019-11-23 ENCOUNTER — Ambulatory Visit (INDEPENDENT_AMBULATORY_CARE_PROVIDER_SITE_OTHER): Payer: Medicare HMO | Admitting: Ophthalmology

## 2019-11-23 DIAGNOSIS — Z961 Presence of intraocular lens: Secondary | ICD-10-CM | POA: Diagnosis not present

## 2019-11-23 DIAGNOSIS — H34811 Central retinal vein occlusion, right eye, with macular edema: Secondary | ICD-10-CM

## 2019-11-23 DIAGNOSIS — H3581 Retinal edema: Secondary | ICD-10-CM | POA: Diagnosis not present

## 2019-11-23 DIAGNOSIS — H35033 Hypertensive retinopathy, bilateral: Secondary | ICD-10-CM

## 2019-11-23 DIAGNOSIS — D6851 Activated protein C resistance: Secondary | ICD-10-CM | POA: Diagnosis not present

## 2019-11-23 DIAGNOSIS — I10 Essential (primary) hypertension: Secondary | ICD-10-CM | POA: Diagnosis not present

## 2019-11-23 MED ORDER — BEVACIZUMAB CHEMO INJECTION 1.25MG/0.05ML SYRINGE FOR KALEIDOSCOPE
1.2500 mg | INTRAVITREAL | Status: AC | PRN
  Administered 2019-11-23: 1.25 mg via INTRAVITREAL

## 2019-11-23 NOTE — Progress Notes (Signed)
[facility] Note  11/23/2019     CHIEF COMPLAINT Patient presents for Retina Follow Up   HISTORY OF PRESENT ILLNESS: Carly Romero is a 74 y.o. female who presents to the clinic today for:   HPI    Retina Follow Up    Patient presents with  CRVO/BRVO.  In right eye.  This started weeks ago.  Severity is moderate.  Duration of weeks.  Since onset it is gradually worsening.  I, the attending physician,  performed the HPI with the patient and updated documentation appropriately.          Comments    Pt states her vision seems worse OD.  Pt complains of new floaters post injection for approximately 2 weeks.  Pt denies eye pain or discomfort.       Last edited by Bernarda Caffey, MD on 11/23/2019  3:15 PM. (History)     Patient states there is no change in vision since last exam  Referring physician: Aretta Nip, MD [place],  [place] [address]  HISTORICAL INFORMATION:   Selected notes from the MEDICAL RECORD NUMBER Referral from Dr. Kathlen Mody for eval of CRVO w/edema OD.     CURRENT MEDICATIONS: Current Outpatient Medications (Ophthalmic Drugs)  Medication Sig  . fluorometholone (FML) 0.1 % ophthalmic suspension   . Polyethyl Glycol-Propyl Glycol (SYSTANE OP) Place 1 drop into both eyes daily as needed (dry eyes).   No current facility-administered medications for this visit. (Ophthalmic Drugs)   Current Outpatient Medications (Other)  Medication Sig  . albuterol (VENTOLIN HFA) 108 (90 Base) MCG/ACT inhaler Inhale 2 puffs into the lungs every 6 (six) hours as needed for wheezing or shortness of breath.  Marland Kitchen BIOTIN PO Take 1 tablet by mouth daily.  . Calcium Carbonate-Vitamin D (CALCIUM + D PO) Take 1 tablet by mouth daily.  Marland Kitchen diltiazem (CARDIZEM CD) 240 MG 24 hr capsule Take 1 capsule (240 mg total) by mouth daily.  Marland Kitchen ELIQUIS 5 MG TABS tablet Take 1 tablet (5 mg total) by mouth 2 (two) times daily.  Marland Kitchen FLUoxetine (PROZAC) 20 MG  capsule Take 20 mg by mouth daily.  . furosemide (LASIX) 40 MG tablet Take 1 tablet (40 mg total) by mouth daily.  Marland Kitchen levothyroxine (SYNTHROID, LEVOTHROID) 100 MCG tablet Take 150 mcg by mouth daily before breakfast.   . loratadine (CLARITIN) 10 MG tablet Take 10 mg by mouth daily as needed for allergies.   Marland Kitchen losartan (COZAAR) 50 MG tablet Take 1 tablet (50 mg total) by mouth daily.  . metoprolol tartrate (LOPRESSOR) 50 MG tablet Take 1 tablet (50 mg total) by mouth 2 (two) times daily.  . Multiple Vitamin (MULTIVITAMIN WITH MINERALS) TABS tablet Take 1 tablet by mouth daily.  . potassium chloride (K-DUR) 10 MEQ tablet Take 1 tablet (10 mEq total) by mouth daily.  . rosuvastatin (CRESTOR) 20 MG tablet Take 20 mg by mouth daily.  Marland Kitchen SHINGRIX injection   . Tiotropium Bromide-Olodaterol (STIOLTO RESPIMAT) 2.5-2.5 MCG/ACT AERS Inhale 2 puffs into the lungs daily.   No current facility-administered medications for this visit. (Other)      REVIEW OF SYSTEMS: ROS    Positive for: Cardiovascular, Eyes, Respiratory   Negative for: Constitutional, Gastrointestinal, Neurological, Skin, Genitourinary, Musculoskeletal, HENT, Endocrine, Psychiatric, Allergic/Imm, Heme/Lymph   Last edited by Doneen Poisson on 11/23/2019  2:32 PM. (History)       ALLERGIES Allergies  Allergen Reactions  . Amiodarone Other (See  Comments)    alopecia  . Chantix [Varenicline]     Suicidal thoughts  . Codeine Swelling    Swelling as a young adult  . Lipitor [Atorvastatin] Diarrhea    And cramping.    PAST MEDICAL HISTORY Past Medical History:  Diagnosis Date  . Atrial fibrillation ([place])   . Factor V Leiden ([place])   . Hyperlipidemia   . Hypertension   . Hypertensive heart disease   . Hypothyroidism   . Macular degeneration    Dry OU  . Obesity (BMI 30-39.9)   . Thyroid disease    Past Surgical History:  Procedure Laterality Date  . ATRIAL FIBRILLATION ABLATION  12/2014   Dr Glennon Mac at [facility]  . ATRIAL  FIBRILLATION ABLATION  08/2015   Dr Glennon Mac at [facility]  . CARDIOVERSION N/A 05/26/2014   Procedure: CARDIOVERSION;  Surgeon: Jacolyn Reedy, MD;  Location: [facility] ENDOSCOPY;  Service: Cardiovascular;  Laterality: N/A;  . CARDIOVERSION N/A 09/14/2014   Procedure: CARDIOVERSION;  Surgeon: Jacolyn Reedy, MD;  Location: [facility] ENDOSCOPY;  Service: Cardiovascular;  Laterality: N/A;  . CARDIOVERSION N/A 05/11/2015   Procedure: CARDIOVERSION;  Surgeon: Jacolyn Reedy, MD;  Location: [place];  Service: Cardiovascular;  Laterality: N/A;  . CATARACT EXTRACTION Bilateral   . COSMETIC SURGERY     lower lids  . DENTAL SURGERY     wisdom teeth  . EYE SURGERY Bilateral    Cat Sx OU  . PACEMAKER IMPLANT N/A 02/26/2018   Procedure: PACEMAKER IMPLANT;  Surgeon: Evans Lance, MD;  Location: [place] CV LAB;  Service: Cardiovascular;  Laterality: N/A;    FAMILY HISTORY Family History  Problem Relation Age of Onset  . Diabetes Brother     SOCIAL HISTORY Social History   Tobacco Use  . Smoking status: Former Smoker    Packs/day: 1.00    Years: 40.00    Pack years: 40.00    Quit date: 12/11/2013    Years since quitting: 5.9  . Smokeless tobacco: Never Used  Substance Use Topics  . Alcohol use: Yes    Alcohol/week: 8.0 standard drinks    Types: 8 Glasses of wine per week  . Drug use: No         OPHTHALMIC EXAM:  Base Eye Exam    Visual Acuity (Snellen - Linear)      Right Left   Dist cc 20/60 -1 20/30 -1   Dist ph cc NI NI   Correction: Glasses       Tonometry (Tonopen, 2:36 PM)      Right Left   Pressure 12 13       Pupils      Dark Light Shape React APD   Right 3 2 Round Brisk 0   Left 3 2 Round Brisk 0       Visual Fields      Left Right    Full Full       Extraocular Movement      Right Left    Full Full       Neuro/Psych    Oriented x3: Yes   Mood/Affect: Normal       Dilation    Both eyes: 1.0% Mydriacyl, 2.5% Phenylephrine @ 2:36 PM        Slit  Lamp and Fundus Exam    Slit Lamp Exam      Right Left   Lids/Lashes Dermatochalasis - upper lid Dermatochalasis - upper lid   Conjunctiva/Sclera White and quiet  White and quiet   Cornea 1+ PEE, mild arcus, well healed temporal cataract wound 1+ PEE, mild arcus, well healed temporal cataract wound   Anterior Chamber deep and clear deep and clear   Iris round and dilated round and dilated   Lens PC IOL in perfect position, trace PCO PC IOL in perfect position, trace PCO   Vitreous syneresis syneresis       Fundus Exam      Right Left   Disc Optic disc edema, 360 flame hemes, CWS superiorly -- all improving compact, mild tilt, pink and sharp   C/D Ratio 0.0 0.4   Macula Blunted foveal reflex, interval improvement in CME, scattered IRH/DBH, focal collection of flame hemes and CWS just inside IT arcades flat, blunted foveal reflex, mild RPE mottling, trace drusen, no heme or edema   Vessels Tortuous, Dilated, +CRVO attenuated, tortuous, mild A/V crossing changes   Periphery Attached, scattered IRH, mild reticular degeneration attached, mild reticular degeneration        Refraction    Wearing Rx      Sphere Cylinder Add   Right -0.75 Sphere +2.50   Left -0.50 Sphere +2.50   Type: PAL          IMAGING AND PROCEDURES  Imaging and Procedures for _0 @  OCT, Retina - OU - Both Eyes       Right Eye Quality was good. Central Foveal Thickness: 281. Progression has improved. Findings include abnormal foveal contour, intraretinal fluid, subretinal fluid, normal foveal contour, no SRF (Interval improvement in central CME--IRF/SRF almost completely resolved; +residual disc edema improved).   Left Eye Quality was good. Central Foveal Thickness: 247. Progression has been stable. Findings include normal foveal contour, no IRF, no SRF (Trace drusen).   Notes *Images captured and stored on drive  Diagnosis / Impression:  OD: CRVO w/ CME: Interval improvement in central CME--IRF/SRF  almost completely resolved; +residual disc edema improved OS: NFP, no IRF/SRF  Clinical management:  See below  Abbreviations: NFP - Normal foveal profile. CME - cystoid macular edema. PED - pigment epithelial detachment. IRF - intraretinal fluid. SRF - subretinal fluid. EZ - ellipsoid zone. ERM - epiretinal membrane. ORA - outer retinal atrophy. ORT - outer retinal tubulation. SRHM - subretinal hyper-reflective material        Fluorescein Angiography Optos (Transit OD)       Right Eye   Progression has no prior data. Early phase findings include delayed filling, staining. Mid/Late phase findings include staining, leakage (Hyper fluorescence of the disc, mild focal leakage along IT arcades, Mild focal leakage perifovea).   Left Eye   Progression has no prior data. Early phase findings include normal observations. Mid/Late phase findings include normal observations (No leakage).   Notes **Images stored on drive**  Impression: OD: CRVO -- delayed filling time, hyperfluorescence of the disc, mild focal leakage along IT arcades, mild focal leakage perifovea OS: normal study         Intravitreal Injection, Pharmacologic Agent - OD - Right Eye       Time Out 11/23/2019. 3:35 PM. Confirmed correct patient, procedure, site, and patient consented.   Anesthesia Topical anesthesia was used. Anesthetic medications included Proparacaine 0.5%.   Procedure Preparation included 5% betadine to ocular surface, eyelid speculum. A (32g) needle was used.   Injection:  1.25 mg Bevacizumab (AVASTIN) SOLN   NDC: 79038-333-83, Lot: [id number] , Expiration date: 02/21/2020   Route: Intravitreal, Site: Right Eye, Waste: 0 mL  Post-op Post injection exam found  visual acuity of at least counting fingers. The patient tolerated the procedure well. There were no complications. The patient received written and verbal post procedure care education.                 ASSESSMENT/PLAN:     ICD-10-CM   1. Central retinal vein occlusion with macular edema of right eye  H34.8110 Fluorescein Angiography Optos (Transit OD)    Intravitreal Injection, Pharmacologic Agent - OD - Right Eye    Bevacizumab (AVASTIN) SOLN 1.25 mg  2. Retinal edema  H35.81 OCT, Retina - OU - Both Eyes  3. Factor V Leiden ([place])  D68.51   4. Essential hypertension  I10   5. Hypertensive retinopathy of both eyes  H35.033 Fluorescein Angiography Optos (Transit OD)  6. Pseudophakia of both eyes  Z96.1     1-3. CRVO with macular edema OD        History of Factor V Leiden on Eliquis  - pt reports history of blood clots / DVTs  - significant CV history with A fib s/p ablation and pacemaker implantation             - s/p IVA OD # 1 (03.09.21)  - OCT shows interval improvement in central CME -- IRF/SRF almost completely resolved; +residual disc edema improved  - FA (04.06.21) shows delayed filling time, hyperfluorescence of the disc, mild focal leakage along IT arcades, mild focal leakage perifovea  - BCVA slightly decreased to 20/60 from 20/50  - recommend IVA OD #2 today, 04.06.21  - pt wishes to proceed  - RBA of procedure discussed, questions answered  - informed consent obtained  - IVA consent form signed and scanned 3.9.21  - see procedure note  - return 4 weeks DFE, OCT, possible injection  4,5. Hypertensive retinopathy OU  - discussed importance of tight BP control  - monitor  6. Pseudophakia OU  - s/p CE/IOL OU w/ Dr. Valetta Close  - IOLs in good position   - monitor   Ophthalmic Meds Ordered this visit:  Meds ordered this encounter  Medications  . Bevacizumab (AVASTIN) SOLN 1.25 mg       Return in about 4 weeks (around 12/21/2019) for f/u CRVO with mac edema OD, DFE, OCT.  There are no Patient Instructions on file for this visit.   Explained the diagnoses, plan, and follow up with the patient and they expressed understanding.  Patient expressed understanding of the importance of proper  follow up care.  This document serves as a record of services personally performed by Gardiner Sleeper, MD, PhD. It was created on their behalf by Estill Bakes, COT an ophthalmic technician. The creation of this record is the provider's dictation and/or activities during the visit.    Electronically signed by: Estill Bakes, COT 11/23/19 @ 10:20 PM  Gardiner Sleeper, M.D., Ph.D. Diseases & Surgery of the Retina and [place] 11/23/2019   I have reviewed the above documentation for accuracy and completeness, and I agree with the above. Gardiner Sleeper, M.D., Ph.D. 11/23/19 10:20 PM   Abbreviations: M myopia (nearsighted); A astigmatism; H hyperopia (farsighted); P presbyopia; Mrx spectacle prescription;  CTL contact lenses; OD right eye; OS left eye; OU both eyes  XT exotropia; ET esotropia; PEK punctate epithelial keratitis; PEE punctate epithelial erosions; DES dry eye syndrome; MGD meibomian gland dysfunction; ATs artificial tears; PFAT's preservative free artificial tears; [place] nuclear sclerotic cataract; PSC posterior subcapsular cataract; ERM epi-retinal membrane; PVD  posterior vitreous detachment; RD retinal detachment; DM diabetes mellitus; DR diabetic retinopathy; NPDR non-proliferative diabetic retinopathy; PDR proliferative diabetic retinopathy; CSME clinically significant macular edema; DME diabetic macular edema; dbh dot blot hemorrhages; CWS cotton wool spot; POAG primary open angle glaucoma; C/D cup-to-disc ratio; HVF humphrey visual field; GVF goldmann visual field; OCT optical coherence tomography; IOP intraocular pressure; BRVO Branch retinal vein occlusion; CRVO central retinal vein occlusion; CRAO central retinal artery occlusion; BRAO branch retinal artery occlusion; RT retinal tear; SB scleral buckle; PPV pars plana vitrectomy; VH Vitreous hemorrhage; PRP panretinal laser photocoagulation; IVK intravitreal kenalog; VMT vitreomacular traction; MH  Macular hole;  NVD neovascularization of the disc; NVE neovascularization elsewhere; AREDS age related eye disease study; ARMD age related macular degeneration; POAG primary open angle glaucoma; EBMD epithelial/anterior basement membrane dystrophy; ACIOL anterior chamber intraocular lens; IOL intraocular lens; PCIOL posterior chamber intraocular lens; Phaco/IOL phacoemulsification with intraocular lens placement; [place] photorefractive keratectomy; LASIK laser assisted in situ keratomileusis; HTN hypertension; DM diabetes mellitus; COPD chronic obstructive pulmonary disease

## 2019-11-29 DIAGNOSIS — H34811 Central retinal vein occlusion, right eye, with macular edema: Secondary | ICD-10-CM | POA: Diagnosis not present

## 2019-11-29 DIAGNOSIS — H26493 Other secondary cataract, bilateral: Secondary | ICD-10-CM | POA: Diagnosis not present

## 2019-11-29 DIAGNOSIS — H18593 Other hereditary corneal dystrophies, bilateral: Secondary | ICD-10-CM | POA: Diagnosis not present

## 2019-11-29 DIAGNOSIS — H04123 Dry eye syndrome of bilateral lacrimal glands: Secondary | ICD-10-CM | POA: Diagnosis not present

## 2019-12-08 DIAGNOSIS — E039 Hypothyroidism, unspecified: Secondary | ICD-10-CM | POA: Diagnosis not present

## 2019-12-08 DIAGNOSIS — I10 Essential (primary) hypertension: Secondary | ICD-10-CM | POA: Diagnosis not present

## 2019-12-08 DIAGNOSIS — M1990 Unspecified osteoarthritis, unspecified site: Secondary | ICD-10-CM | POA: Diagnosis not present

## 2019-12-08 DIAGNOSIS — I480 Paroxysmal atrial fibrillation: Secondary | ICD-10-CM | POA: Diagnosis not present

## 2019-12-08 DIAGNOSIS — J449 Chronic obstructive pulmonary disease, unspecified: Secondary | ICD-10-CM | POA: Diagnosis not present

## 2019-12-08 DIAGNOSIS — I4891 Unspecified atrial fibrillation: Secondary | ICD-10-CM | POA: Diagnosis not present

## 2019-12-08 DIAGNOSIS — E7800 Pure hypercholesterolemia, unspecified: Secondary | ICD-10-CM | POA: Diagnosis not present

## 2019-12-21 ENCOUNTER — Ambulatory Visit (INDEPENDENT_AMBULATORY_CARE_PROVIDER_SITE_OTHER): Payer: Medicare HMO | Admitting: Ophthalmology

## 2019-12-21 ENCOUNTER — Other Ambulatory Visit: Payer: Self-pay

## 2019-12-21 ENCOUNTER — Encounter (INDEPENDENT_AMBULATORY_CARE_PROVIDER_SITE_OTHER): Payer: Self-pay | Admitting: Ophthalmology

## 2019-12-21 DIAGNOSIS — I10 Essential (primary) hypertension: Secondary | ICD-10-CM

## 2019-12-21 DIAGNOSIS — Z961 Presence of intraocular lens: Secondary | ICD-10-CM

## 2019-12-21 DIAGNOSIS — D6851 Activated protein C resistance: Secondary | ICD-10-CM | POA: Diagnosis not present

## 2019-12-21 DIAGNOSIS — H34811 Central retinal vein occlusion, right eye, with macular edema: Secondary | ICD-10-CM

## 2019-12-21 DIAGNOSIS — H35033 Hypertensive retinopathy, bilateral: Secondary | ICD-10-CM

## 2019-12-21 DIAGNOSIS — H3581 Retinal edema: Secondary | ICD-10-CM

## 2019-12-21 MED ORDER — BEVACIZUMAB CHEMO INJECTION 1.25MG/0.05ML SYRINGE FOR KALEIDOSCOPE
1.2500 mg | INTRAVITREAL | Status: AC | PRN
  Administered 2019-12-21: 1.25 mg via INTRAVITREAL

## 2019-12-21 NOTE — Progress Notes (Signed)
[facility] Note  12/21/2019     CHIEF COMPLAINT Patient presents for Retina Follow Up   HISTORY OF PRESENT ILLNESS: Carly Romero is a 74 y.o. female who presents to the clinic today for:   HPI    Retina Follow Up    Patient presents with  CRVO/BRVO.  In right eye.  This started 2 months ago.  Severity is moderate.  Duration of 4 weeks.  Since onset it is gradually improving.  I, the attending physician,  performed the HPI with the patient and updated documentation appropriately.          Comments    74 y/o female pt here for 4 wk f/u for CRVO OD.  Feels VA OD is "horrible," and much worse than at last visit.  No change in [place] OS.  Denies pain, FOL, floaters.  Systane gel prn OU.       Last edited by Bernarda Caffey, MD on 12/21/2019  3:27 PM. (History)     Patient states she feels like her right eye has gotten worse  Referring physician: Aretta Nip, MD [place],  [place] [address]  HISTORICAL INFORMATION:   Selected notes from the MEDICAL RECORD NUMBER Referral from Dr. Kathlen Mody for eval of CRVO w/edema OD.     CURRENT MEDICATIONS: Current Outpatient Medications (Ophthalmic Drugs)  Medication Sig  . fluorometholone (FML) 0.1 % ophthalmic suspension   . Polyethyl Glycol-Propyl Glycol (SYSTANE OP) Place 1 drop into both eyes daily as needed (dry eyes).   No current facility-administered medications for this visit. (Ophthalmic Drugs)   Current Outpatient Medications (Other)  Medication Sig  . albuterol (VENTOLIN HFA) 108 (90 Base) MCG/ACT inhaler Inhale 2 puffs into the lungs every 6 (six) hours as needed for wheezing or shortness of breath.  Marland Kitchen BIOTIN PO Take 1 tablet by mouth daily.  . Calcium Carbonate-Vitamin D (CALCIUM + D PO) Take 1 tablet by mouth daily.  Marland Kitchen diltiazem (CARDIZEM CD) 240 MG 24 hr capsule Take 1 capsule (240 mg total) by mouth daily.  Marland Kitchen ELIQUIS 5 MG TABS tablet Take 1 tablet (5 mg total) by mouth 2  (two) times daily.  Marland Kitchen FLUoxetine (PROZAC) 20 MG capsule Take 20 mg by mouth daily.  . furosemide (LASIX) 40 MG tablet Take 1 tablet (40 mg total) by mouth daily.  Marland Kitchen levothyroxine (SYNTHROID, LEVOTHROID) 100 MCG tablet Take 150 mcg by mouth daily before breakfast.   . loratadine (CLARITIN) 10 MG tablet Take 10 mg by mouth daily as needed for allergies.   Marland Kitchen losartan (COZAAR) 50 MG tablet Take 1 tablet (50 mg total) by mouth daily.  . metoprolol tartrate (LOPRESSOR) 50 MG tablet Take 1 tablet (50 mg total) by mouth 2 (two) times daily.  . Multiple Vitamin (MULTIVITAMIN WITH MINERALS) TABS tablet Take 1 tablet by mouth daily.  . potassium chloride (K-DUR) 10 MEQ tablet Take 1 tablet (10 mEq total) by mouth daily.  . rosuvastatin (CRESTOR) 20 MG tablet Take 20 mg by mouth daily.  Marland Kitchen SHINGRIX injection   . Tiotropium Bromide-Olodaterol (STIOLTO RESPIMAT) 2.5-2.5 MCG/ACT AERS Inhale 2 puffs into the lungs daily.   No current facility-administered medications for this visit. (Other)      REVIEW OF SYSTEMS: ROS    Positive for: Cardiovascular, Eyes, Respiratory   Negative for: Constitutional, Gastrointestinal, Neurological, Skin, Genitourinary, Musculoskeletal, HENT, Endocrine, Psychiatric, Allergic/Imm, Heme/Lymph   Last edited by Matthew Folks, COA on 12/21/2019  2:10  PM. (History)       ALLERGIES Allergies  Allergen Reactions  . Amiodarone Other (See Comments)    alopecia  . Chantix [Varenicline]     Suicidal thoughts  . Codeine Swelling    Swelling as a young adult  . Lipitor [Atorvastatin] Diarrhea    And cramping.    PAST MEDICAL HISTORY Past Medical History:  Diagnosis Date  . Atrial fibrillation ([place])   . Factor V Leiden ([place])   . Hyperlipidemia   . Hypertension   . Hypertensive heart disease   . Hypothyroidism   . Macular degeneration    Dry OU  . Obesity (BMI 30-39.9)   . Thyroid disease    Past Surgical History:  Procedure Laterality Date  . ATRIAL  FIBRILLATION ABLATION  12/2014   Dr Glennon Mac at [facility]  . ATRIAL FIBRILLATION ABLATION  08/2015   Dr Glennon Mac at [facility]  . CARDIOVERSION N/A 05/26/2014   Procedure: CARDIOVERSION;  Surgeon: Jacolyn Reedy, MD;  Location: [facility] ENDOSCOPY;  Service: Cardiovascular;  Laterality: N/A;  . CARDIOVERSION N/A 09/14/2014   Procedure: CARDIOVERSION;  Surgeon: Jacolyn Reedy, MD;  Location: [facility] ENDOSCOPY;  Service: Cardiovascular;  Laterality: N/A;  . CARDIOVERSION N/A 05/11/2015   Procedure: CARDIOVERSION;  Surgeon: Jacolyn Reedy, MD;  Location: [facility];  Service: Cardiovascular;  Laterality: N/A;  . CATARACT EXTRACTION Bilateral   . COSMETIC SURGERY     lower lids  . DENTAL SURGERY     wisdom teeth  . EYE SURGERY Bilateral    Cat Sx OU  . PACEMAKER IMPLANT N/A 02/26/2018   Procedure: PACEMAKER IMPLANT;  Surgeon: Evans Lance, MD;  Location: [facility];  Service: Cardiovascular;  Laterality: N/A;    FAMILY HISTORY Family History  Problem Relation Age of Onset  . Diabetes Brother     SOCIAL HISTORY Social History   Tobacco Use  . Smoking status: Former Smoker    Packs/day: 1.00    Years: 40.00    Pack years: 40.00    Quit date: 12/11/2013    Years since quitting: 6.0  . Smokeless tobacco: Never Used  Substance Use Topics  . Alcohol use: Yes    Alcohol/week: 8.0 standard drinks    Types: 8 Glasses of wine per week  . Drug use: No         OPHTHALMIC EXAM:  Base Eye Exam    Visual Acuity (Snellen - Linear)      Right Left   Dist cc 20/30 -2 20/25 -2   Dist ph cc NI NI   Correction: Glasses       Tonometry (Tonopen, 2:12 PM)      Right Left   Pressure 11 12       Pupils      Dark Light Shape React APD   Right 3 2 Round Brisk None   Left 3 2 Round Brisk None       Visual Fields (Counting fingers)      Left Right    Full Full       Extraocular Movement      Right Left    Full, Ortho Full, Ortho       Neuro/Psych    Oriented x3: Yes    Mood/Affect: Normal       Dilation    Both eyes: 1.0% Mydriacyl, 2.5% Phenylephrine @ 2:12 PM        Slit Lamp and Fundus Exam    Slit Lamp Exam  Right Left   Lids/Lashes Dermatochalasis - upper lid Dermatochalasis - upper lid   Conjunctiva/Sclera White and quiet White and quiet   Cornea 1+ PEE, mild arcus, well healed temporal cataract wound 1+ PEE, mild arcus, well healed temporal cataract wound   Anterior Chamber deep and clear deep and clear   Iris round and dilated round and dilated   Lens PC IOL in perfect position, trace PCO PC IOL in perfect position, trace PCO   Vitreous syneresis syneresis       Fundus Exam      Right Left   Disc Optic disc edema, 360 flame hemes, CWS superiorly -- all improving, focal fibrosis superior disc compact, mild tilt, pink and sharp   C/D Ratio 0.0 0.4   Macula Blunted foveal reflex, interval increase in CME, scattered IRH/DBH - improving, focal collection of flame hemes and CWS just inside IT arcades -- improving flat, blunted foveal reflex, mild RPE mottling, trace drusen, no heme or edema   Vessels Tortuous, Dilated, +CRVO attenuated, tortuous, mild A/V crossing changes   Periphery Attached, scattered IRH, mild reticular degeneration attached, mild reticular degeneration          IMAGING AND PROCEDURES  Imaging and Procedures for @TODAY @  OCT, Retina - OU - Both Eyes       Right Eye Quality was good. Central Foveal Thickness: 274. Progression has worsened. Findings include abnormal foveal contour, intraretinal fluid, subretinal fluid, normal foveal contour, no SRF (Interval recurrence of cystic changes).   Left Eye Quality was good. Central Foveal Thickness: 239. Progression has been stable. Findings include normal foveal contour, no IRF, no SRF (Trace drusen).   Notes *Images captured and stored on drive  Diagnosis / Impression:  OD: CRVO w/ CME: Interval recurrence of cystic changes OS: NFP, no IRF/SRF  Clinical  management:  See below  Abbreviations: NFP - Normal foveal profile. CME - cystoid macular edema. PED - pigment epithelial detachment. IRF - intraretinal fluid. SRF - subretinal fluid. EZ - ellipsoid zone. ERM - epiretinal membrane. ORA - outer retinal atrophy. ORT - outer retinal tubulation. SRHM - subretinal hyper-reflective material        Intravitreal Injection, Pharmacologic Agent - OD - Right Eye       Time Out 12/21/2019. 1:55 PM. Confirmed correct patient, procedure, site, and patient consented.   Anesthesia Topical anesthesia was used. Anesthetic medications included Lidocaine 2%, Proparacaine 0.5%.   Procedure Preparation included 5% betadine to ocular surface, eyelid speculum. A (32g) needle was used.   Injection:  1.25 mg Bevacizumab (AVASTIN) SOLN   NDC: [id number], Lot: [id number] , Expiration date: 03/14/2020   Route: Intravitreal, Site: Right Eye, Waste: 0 mL  Post-op Post injection exam found visual acuity of at least counting fingers. The patient tolerated the procedure well. There were no complications. The patient received written and verbal post procedure care education.                 ASSESSMENT/PLAN:    ICD-10-CM   1. Central retinal vein occlusion with macular edema of right eye  H34.8110 Intravitreal Injection, Pharmacologic Agent - OD - Right Eye    Bevacizumab (AVASTIN) SOLN 1.25 mg  2. Retinal edema  H35.81 OCT, Retina - OU - Both Eyes  3. Factor V Leiden ([place])  D68.51   4. Essential hypertension  I10   5. Hypertensive retinopathy of both eyes  H35.033   6. Pseudophakia of both eyes  Z96.1     1-3. CRVO with macular  edema OD        History of Factor V Leiden on Eliquis  - pt reports history of blood clots / DVTs  - significant CV history with A fib s/p ablation and pacemaker implantation             - s/p IVA OD # 1 (03.09.21), #2 (04.06.21)  - FA (04.06.21) shows delayed filling time, hyperfluorescence of the disc, mild focal leakage  along IT arcades, mild focal leakage perifovea  - OCT today shows interval recurrence of cystic changes  - BCVA improved to 20/30 from 20/60  - recommend IVA OD #3 today, 05.04.21  - pt wishes to proceed  - RBA of procedure discussed, questions answered  - informed consent obtained  - IVA consent form signed and scanned 03.09.21  - see procedure note  - return 4 weeks DFE, OCT, possible injection  4,5. Hypertensive retinopathy OU  - discussed importance of tight BP control  - monitor  6. Pseudophakia OU  - s/p CE/IOL OU w/ Dr. Valetta Close  - IOLs in good position   - monitor   Ophthalmic Meds Ordered this visit:  Meds ordered this encounter  Medications  . Bevacizumab (AVASTIN) SOLN 1.25 mg       Return in about 4 weeks (around 01/18/2020) for f/u CRVO OD, DFE, OCT.  There are no Patient Instructions on file for this visit.   Explained the diagnoses, plan, and follow up with the patient and they expressed understanding.  Patient expressed understanding of the importance of proper follow up care.  This document serves as a record of services personally performed by Gardiner Sleeper, MD, PhD. It was created on their behalf by Estill Bakes, COT an ophthalmic technician. The creation of this record is the provider's dictation and/or activities during the visit.    Electronically signed by: Estill Bakes, COT 12/21/19 @ 11:42 PM   This document serves as a record of services personally performed by Gardiner Sleeper, MD, PhD. It was created on their behalf by Ernest Mallick, OA, an ophthalmic assistant. The creation of this record is the provider's dictation and/or activities during the visit.    Electronically signed by: Ernest Mallick, OA 05.04.2021 11:42 PM  Gardiner Sleeper, M.D., Ph.D. Diseases & Surgery of the Retina and [place] 12/21/2019   I have reviewed the above documentation for accuracy and completeness, and I agree with the above. Gardiner Sleeper, M.D., Ph.D. 12/21/19 11:42 PM   Abbreviations: M myopia (nearsighted); A astigmatism; H hyperopia (farsighted); P presbyopia; Mrx spectacle prescription;  CTL contact lenses; OD right eye; OS left eye; OU both eyes  XT exotropia; ET esotropia; PEK punctate epithelial keratitis; PEE punctate epithelial erosions; DES dry eye syndrome; MGD meibomian gland dysfunction; ATs artificial tears; PFAT's preservative free artificial tears; [place] nuclear sclerotic cataract; PSC posterior subcapsular cataract; ERM epi-retinal membrane; PVD posterior vitreous detachment; RD retinal detachment; DM diabetes mellitus; DR diabetic retinopathy; NPDR non-proliferative diabetic retinopathy; PDR proliferative diabetic retinopathy; CSME clinically significant macular edema; DME diabetic macular edema; dbh dot blot hemorrhages; CWS cotton wool spot; POAG primary open angle glaucoma; C/D cup-to-disc ratio; HVF humphrey visual field; GVF goldmann visual field; OCT optical coherence tomography; IOP intraocular pressure; BRVO Branch retinal vein occlusion; CRVO central retinal vein occlusion; CRAO central retinal artery occlusion; BRAO branch retinal artery occlusion; RT retinal tear; SB scleral buckle; PPV pars plana vitrectomy; VH Vitreous hemorrhage; PRP panretinal laser photocoagulation; IVK intravitreal kenalog;  VMT vitreomacular traction; MH Macular hole;  NVD neovascularization of the disc; NVE neovascularization elsewhere; AREDS age related eye disease study; ARMD age related macular degeneration; POAG primary open angle glaucoma; EBMD epithelial/anterior basement membrane dystrophy; ACIOL anterior chamber intraocular lens; IOL intraocular lens; PCIOL posterior chamber intraocular lens; Phaco/IOL phacoemulsification with intraocular lens placement; PRK photorefractive keratectomy; LASIK laser assisted in situ keratomileusis; HTN hypertension; DM diabetes mellitus; COPD chronic obstructive pulmonary disease 

## 2019-12-24 ENCOUNTER — Telehealth: Payer: Self-pay | Admitting: Cardiology

## 2019-12-24 MED ORDER — LOSARTAN POTASSIUM 50 MG PO TABS: 50.0000 mg | ORAL_TABLET | Freq: Every day | ORAL | 3 refills | Status: DC

## 2019-12-24 NOTE — Telephone Encounter (Signed)
Refill send to pts preferred pharmacy.

## 2019-12-24 NOTE — Telephone Encounter (Signed)
New message   Patient needs a new prescription for   losartan (COZAAR) 50 MG tablet   Sent to [facility] [place] [place] to [facility]

## 2019-12-27 ENCOUNTER — Ambulatory Visit (INDEPENDENT_AMBULATORY_CARE_PROVIDER_SITE_OTHER): Payer: Medicare HMO | Admitting: *Deleted

## 2019-12-27 DIAGNOSIS — I495 Sick sinus syndrome: Secondary | ICD-10-CM | POA: Diagnosis not present

## 2019-12-27 LAB — CUP PACEART REMOTE DEVICE CHECK
Battery Remaining Longevity: 122 mo
Battery Remaining Percentage: 95.5 %
Battery Voltage: 3.01 V
Brady Statistic AP VP Percent: 0 %
Brady Statistic AP VS Percent: 0 %
Brady Statistic AS VP Percent: 0 %
Brady Statistic AS VS Percent: 0 %
Brady Statistic RA Percent Paced: 1 %
Brady Statistic RV Percent Paced: 50 %
Date Time Interrogation Session: 20210510020016
Implantable Lead Implant Date: 20190711
Implantable Lead Implant Date: 20190711
Implantable Lead Location: 753859
Implantable Lead Location: 753860
Implantable Pulse Generator Implant Date: 20190711
Lead Channel Impedance Value: 600 Ohm
Lead Channel Impedance Value: 600 Ohm
Lead Channel Pacing Threshold Amplitude: 0.75 V
Lead Channel Pacing Threshold Amplitude: 1 V
Lead Channel Pacing Threshold Pulse Width: 0.4 ms
Lead Channel Pacing Threshold Pulse Width: 0.5 ms
Lead Channel Sensing Intrinsic Amplitude: 1.2 mV
Lead Channel Sensing Intrinsic Amplitude: 12 mV
Lead Channel Setting Pacing Amplitude: 1.75 V
Lead Channel Setting Pacing Amplitude: 2.5 V
Lead Channel Setting Pacing Pulse Width: 0.4 ms
Lead Channel Setting Sensing Sensitivity: 2 mV
Pulse Gen Model: 2272
Pulse Gen Serial Number: 9041042

## 2019-12-27 NOTE — Progress Notes (Signed)
Remote pacemaker transmission.   

## 2020-01-03 ENCOUNTER — Telehealth: Payer: Self-pay | Admitting: Cardiology

## 2020-01-03 NOTE — Telephone Encounter (Signed)
*  STAT* If patient is at the pharmacy, call can be transferred to refill team.   1. Which medications need to be refilled? (please list name of each medication and dose if known)  losartan (COZAAR) 50 MG tablet  2. Which pharmacy/location (including street and city if local pharmacy) is medication to be sent to? [facility] [place] [place] [facility] [place] [facility]  3. Do they need a 30 day or 90 day supply? 90 day supply

## 2020-01-05 DIAGNOSIS — M1990 Unspecified osteoarthritis, unspecified site: Secondary | ICD-10-CM | POA: Diagnosis not present

## 2020-01-05 DIAGNOSIS — I480 Paroxysmal atrial fibrillation: Secondary | ICD-10-CM | POA: Diagnosis not present

## 2020-01-05 DIAGNOSIS — E7800 Pure hypercholesterolemia, unspecified: Secondary | ICD-10-CM | POA: Diagnosis not present

## 2020-01-05 DIAGNOSIS — J449 Chronic obstructive pulmonary disease, unspecified: Secondary | ICD-10-CM | POA: Diagnosis not present

## 2020-01-05 DIAGNOSIS — I4891 Unspecified atrial fibrillation: Secondary | ICD-10-CM | POA: Diagnosis not present

## 2020-01-05 DIAGNOSIS — I10 Essential (primary) hypertension: Secondary | ICD-10-CM | POA: Diagnosis not present

## 2020-01-05 DIAGNOSIS — E039 Hypothyroidism, unspecified: Secondary | ICD-10-CM | POA: Diagnosis not present

## 2020-01-11 ENCOUNTER — Other Ambulatory Visit: Payer: Self-pay

## 2020-01-13 MED ORDER — LOSARTAN POTASSIUM 50 MG PO TABS: 50.0000 mg | ORAL_TABLET | Freq: Every day | ORAL | 3 refills | Status: DC

## 2020-01-13 MED ORDER — LOSARTAN POTASSIUM 50 MG PO TABS: 50.0000 mg | ORAL_TABLET | Freq: Every day | ORAL | 0 refills | Status: DC

## 2020-01-13 NOTE — Telephone Encounter (Signed)
Follow Up  Patient is out of medication and has been out of medication for about 10 days. Patient needs 90 day supply sent to [facility] and a short supply sent to [facility]. Please assist with the refills. Please call back to confirm refill has been called in.      *STAT* If patient is at the pharmacy, call can be transferred to refill team.   1. Which medications need to be refilled? (please list name of each medication and dose if known)losartan (COZAAR) 50 MG tablet  2. Which pharmacy/location (including street and city if local pharmacy) is medication to be sent to? [place], [place]  3. Do they need a 30 day or 90 day supply? Short Supply sent to pharmacy.

## 2020-01-13 NOTE — Telephone Encounter (Signed)
Called patient to let her know prescription was sent in to both pharmacies.

## 2020-01-14 ENCOUNTER — Telehealth: Payer: Self-pay

## 2020-01-14 MED ORDER — LOSARTAN POTASSIUM 50 MG PO TABS: 50.0000 mg | ORAL_TABLET | Freq: Every day | ORAL | 3 refills | Status: DC

## 2020-01-14 NOTE — Telephone Encounter (Signed)
Spoke to patient about recent email.She stated mail order pharmacy [facility] said it was too early to refill losartan.Refill sent to local pharmacy until she receives mail order prescription.

## 2020-01-18 ENCOUNTER — Other Ambulatory Visit: Payer: Self-pay

## 2020-01-18 ENCOUNTER — Ambulatory Visit (INDEPENDENT_AMBULATORY_CARE_PROVIDER_SITE_OTHER): Payer: Medicare HMO | Admitting: Ophthalmology

## 2020-01-18 DIAGNOSIS — Z961 Presence of intraocular lens: Secondary | ICD-10-CM

## 2020-01-18 DIAGNOSIS — I10 Essential (primary) hypertension: Secondary | ICD-10-CM

## 2020-01-18 DIAGNOSIS — H35033 Hypertensive retinopathy, bilateral: Secondary | ICD-10-CM | POA: Diagnosis not present

## 2020-01-18 DIAGNOSIS — H3581 Retinal edema: Secondary | ICD-10-CM | POA: Diagnosis not present

## 2020-01-18 DIAGNOSIS — D6851 Activated protein C resistance: Secondary | ICD-10-CM

## 2020-01-18 DIAGNOSIS — H34811 Central retinal vein occlusion, right eye, with macular edema: Secondary | ICD-10-CM

## 2020-01-18 NOTE — Progress Notes (Signed)
[facility] Note  01/18/2020     CHIEF COMPLAINT Patient presents for Retina Follow Up   HISTORY OF PRESENT ILLNESS: Carly Romero is a 74 y.o. female who presents to the clinic today for:   HPI    Retina Follow Up    Patient presents with  CRVO/BRVO.  In right eye.  Duration of 4 weeks.  Since onset it is stable.  I, the attending physician,  performed the HPI with the patient and updated documentation appropriately.          Comments    4 week follow up CRVO OD- Vision appears stable OD since last visit.  Denies any new problems.        Last edited by Bernarda Caffey, MD on 01/18/2020  4:05 PM. (History)    Patient states she feels her vision is about the same OD as last visit.   Referring physician: Hortencia Pilar, MD [place],  [place] [address]  HISTORICAL INFORMATION:   Selected notes from the MEDICAL RECORD NUMBER Referral from Dr. Kathlen Mody for eval of CRVO w/edema OD.     CURRENT MEDICATIONS: Current Outpatient Medications (Ophthalmic Drugs)  Medication Sig   fluorometholone (FML) 0.1 % ophthalmic suspension    Polyethyl Glycol-Propyl Glycol (SYSTANE OP) Place 1 drop into both eyes daily as needed (dry eyes).   No current facility-administered medications for this visit. (Ophthalmic Drugs)   Current Outpatient Medications (Other)  Medication Sig   albuterol (VENTOLIN HFA) 108 (90 Base) MCG/ACT inhaler Inhale 2 puffs into the lungs every 6 (six) hours as needed for wheezing or shortness of breath.   BIOTIN PO Take 1 tablet by mouth daily.   Calcium Carbonate-Vitamin D (CALCIUM + D PO) Take 1 tablet by mouth daily.   diltiazem (CARDIZEM CD) 240 MG 24 hr capsule Take 1 capsule (240 mg total) by mouth daily.   ELIQUIS 5 MG TABS tablet Take 1 tablet (5 mg total) by mouth 2 (two) times daily.   FLUoxetine (PROZAC) 20 MG capsule Take 20 mg by mouth daily.   furosemide (LASIX) 40 MG tablet Take 1 tablet (40  mg total) by mouth daily.   levothyroxine (SYNTHROID, LEVOTHROID) 100 MCG tablet Take 150 mcg by mouth daily before breakfast.    loratadine (CLARITIN) 10 MG tablet Take 10 mg by mouth daily as needed for allergies.    losartan (COZAAR) 50 MG tablet Take 1 tablet (50 mg total) by mouth daily.   metoprolol tartrate (LOPRESSOR) 50 MG tablet Take 1 tablet (50 mg total) by mouth 2 (two) times daily.   Multiple Vitamin (MULTIVITAMIN WITH MINERALS) TABS tablet Take 1 tablet by mouth daily.   potassium chloride (K-DUR) 10 MEQ tablet Take 1 tablet (10 mEq total) by mouth daily.   rosuvastatin (CRESTOR) 20 MG tablet Take 20 mg by mouth daily.   SHINGRIX injection    Tiotropium Bromide-Olodaterol (STIOLTO RESPIMAT) 2.5-2.5 MCG/ACT AERS Inhale 2 puffs into the lungs daily.   No current facility-administered medications for this visit. (Other)      REVIEW OF SYSTEMS: ROS    Positive for: Endocrine, Cardiovascular, Eyes, Respiratory   Negative for: Constitutional, Gastrointestinal, Neurological, Skin, Genitourinary, Musculoskeletal, HENT, Psychiatric, Allergic/Imm, Heme/Lymph   Last edited by Leonie Douglas, COA on 01/18/2020  3:06 PM. (History)       ALLERGIES Allergies  Allergen Reactions   Amiodarone Other (See Comments)    alopecia   Chantix [Varenicline]  Suicidal thoughts   Codeine Swelling    Swelling as a young adult   Lipitor [Atorvastatin] Diarrhea    And cramping.    PAST MEDICAL HISTORY Past Medical History:  Diagnosis Date   Atrial fibrillation ([place])    Factor V Leiden ([place])    Hyperlipidemia    Hypertension    Hypertensive heart disease    Hypothyroidism    Macular degeneration    Dry OU   Obesity (BMI 30-39.9)    Thyroid disease    Past Surgical History:  Procedure Laterality Date   ATRIAL FIBRILLATION ABLATION  12/2014   Dr Glennon Mac at [facility]  08/2015   Dr Glennon Mac at [facility] 05/26/2014    Procedure: CARDIOVERSION;  Surgeon: Jacolyn Reedy, MD;  Location: [facility];  Service: Cardiovascular;  Laterality: N/A;   CARDIOVERSION N/A 09/14/2014   Procedure: CARDIOVERSION;  Surgeon: Jacolyn Reedy, MD;  Location: [facility];  Service: Cardiovascular;  Laterality: N/A;   CARDIOVERSION N/A 05/11/2015   Procedure: CARDIOVERSION;  Surgeon: Jacolyn Reedy, MD;  Location: [facility];  Service: Cardiovascular;  Laterality: N/A;   CATARACT EXTRACTION Bilateral    COSMETIC SURGERY     lower lids   DENTAL SURGERY     wisdom teeth   EYE SURGERY Bilateral    Cat Sx OU   PACEMAKER IMPLANT N/A 02/26/2018   Procedure: PACEMAKER IMPLANT;  Surgeon: Evans Lance, MD;  Location: [facility];  Service: Cardiovascular;  Laterality: N/A;    FAMILY HISTORY Family History  Problem Relation Age of Onset   Diabetes Brother     SOCIAL HISTORY Social History   Tobacco Use   Smoking status: Former Smoker    Packs/day: 1.00    Years: 40.00    Pack years: 40.00    Quit date: 12/11/2013    Years since quitting: 6.1   Smokeless tobacco: Never Used  Substance Use Topics   Alcohol use: Yes    Alcohol/week: 8.0 standard drinks    Types: 8 Glasses of wine per week   Drug use: No         OPHTHALMIC EXAM:  Base Eye Exam    Visual Acuity (Snellen - Linear)      Right Left   Dist cc 20/40 +2 20/20 -1   Dist ph cc NI    Correction: Glasses       Tonometry (Tonopen, 3:15 PM)      Right Left   Pressure 15 14       Pupils      Dark Light Shape React APD   Right 3 2 Round Brisk None   Left 4 3 Round Brisk None       Visual Fields (Counting fingers)      Left Right    Full Full       Extraocular Movement      Right Left    Full Full       Neuro/Psych    Oriented x3: Yes   Mood/Affect: Normal       Dilation    Both eyes: 1.0% Mydriacyl, 2.5% Phenylephrine @ 3:16 PM        Slit Lamp and Fundus Exam    Slit Lamp Exam      Right Left    Lids/Lashes Dermatochalasis - upper lid Dermatochalasis - upper lid   Conjunctiva/Sclera White and quiet White and quiet   Cornea 1+ PEE, mild arcus, well  healed temporal cataract wound 1+ PEE, mild arcus, well healed temporal cataract wound   Anterior Chamber deep and clear deep and clear   Iris round and dilated round and dilated   Lens PC IOL in perfect position, trace PCO PC IOL in perfect position, trace PCO   Vitreous syneresis syneresis       Fundus Exam      Right Left   Disc Optic disc edema, 360 flame hemes, CWS superiorly -- all improving, focal fibrosis superior disc, hyperemia compact, mild tilt, pink and sharp   C/D Ratio 0.0 0.4   Macula Blunted foveal reflex, persistent cystic changes, scattered IRH/DBH - improving, focal collection of flame hemes and CWS just inside IT arcades -- improving flat, good foveal reflex, mild RPE mottling, trace drusen, no heme or edema   Vessels Tortuous, Dilated, +CRVO attenuated, tortuous, mild A/V crossing changes   Periphery Attached, scattered IRH, mild reticular degeneration attached, mild reticular degeneration          IMAGING AND PROCEDURES  Imaging and Procedures for @TODAY @  OCT, Retina - OU - Both Eyes       Right Eye Quality was good. Central Foveal Thickness: 263. Progression has been stable. Findings include abnormal foveal contour, intraretinal fluid, normal foveal contour, no SRF (persistent cystic changes).   Left Eye Quality was good. Central Foveal Thickness: 245. Progression has been stable. Findings include normal foveal contour, no IRF, no SRF (Trace drusen).   Notes *Images captured and stored on drive  Diagnosis / Impression:  OD: CRVO w/ CME: persistent cystic changes, ? Mild improvement centrally OS: NFP, no IRF/SRF  Clinical management:  See below  Abbreviations: NFP - Normal foveal profile. CME - cystoid macular edema. PED - pigment epithelial detachment. IRF - intraretinal fluid. SRF - subretinal  fluid. EZ - ellipsoid zone. ERM - epiretinal membrane. ORA - outer retinal atrophy. ORT - outer retinal tubulation. SRHM - subretinal hyper-reflective material        Intravitreal Injection, Pharmacologic Agent - OD - Right Eye       Time Out 01/18/2020. 3:20 PM. Confirmed correct patient, procedure, site, and patient consented.   Anesthesia Topical anesthesia was used. Anesthetic medications included Lidocaine 2%, Proparacaine 0.5%.   Procedure Preparation included 5% betadine to ocular surface, eyelid speculum. A supplied needle was used.   Injection:  1.25 mg Bevacizumab (AVASTIN) SOLN   NDC: [id number], Lot: [id number] , Expiration date: 03/01/2020   Route: Intravitreal, Site: Right Eye, Waste: 0 mL  Post-op Post injection exam found visual acuity of at least counting fingers. The patient tolerated the procedure well. There were no complications. The patient received written and verbal post procedure care education.                 ASSESSMENT/PLAN:    ICD-10-CM   1. Central retinal vein occlusion with macular edema of right eye  H34.8110 Intravitreal Injection, Pharmacologic Agent - OD - Right Eye    Bevacizumab (AVASTIN) SOLN 1.25 mg  2. Retinal edema  H35.81 OCT, Retina - OU - Both Eyes  3. Factor V Leiden ([place])  D68.51   4. Essential hypertension  I10   5. Hypertensive retinopathy of both eyes  H35.033   6. Pseudophakia of both eyes  Z96.1     1-3. CRVO with macular edema OD        History of Factor V Leiden on Eliquis  - pt reports history of blood clots / DVTs  - significant CV history  with A fib s/p ablation and pacemaker implantation             - s/p IVA OD # 1 (03.09.21), #2 (04.06.21), #3 (5.4.21)  - FA (04.06.21) shows delayed filling time, hyperfluorescence of the disc, mild focal leakage along IT arcades, mild focal leakage perifovea  - OCT today shows persistent cystic changes, ? Mild improvement centrally  - BCVA slightly worse at 20/40+2 (down  from 20/30-2)  - recommend IVA OD #4 today, 6.1.21  - pt wishes to proceed  - RBA of procedure discussed, questions answered  - informed consent obtained  - IVA consent form signed and scanned 03.09.21  - see procedure note  - return 4-5 weeks DFE, OCT, possible injection  4,5. Hypertensive retinopathy OU  - discussed importance of tight BP control  - monitor  6. Pseudophakia OU  - s/p CE/IOL OU w/ Dr. Valetta Close  - IOLs in good position   - monitor   Ophthalmic Meds Ordered this visit:  Meds ordered this encounter  Medications   Bevacizumab (AVASTIN) SOLN 1.25 mg       Return 4-5 weeks, for DFE, OCT.  There are no Patient Instructions on file for this visit.   Explained the diagnoses, plan, and follow up with the patient and they expressed understanding.  Patient expressed understanding of the importance of proper follow up care.  This document serves as a record of services personally performed by Gardiner Sleeper, MD, PhD. It was created on their behalf by Roselee Nova, COMT. The creation of this record is the provider's dictation and/or activities during the visit.  Electronically signed by: Roselee Nova, COMT 01/21/20 1:43 AM  Gardiner Sleeper, M.D., Ph.D. Diseases & Surgery of the Retina and [place] 01/18/2020   I have reviewed the above documentation for accuracy and completeness, and I agree with the above. Gardiner Sleeper, M.D., Ph.D. 01/21/20 1:43 AM   Abbreviations: M myopia (nearsighted); A astigmatism; H hyperopia (farsighted); P presbyopia; Mrx spectacle prescription;  CTL contact lenses; OD right eye; OS left eye; OU both eyes  XT exotropia; ET esotropia; PEK punctate epithelial keratitis; PEE punctate epithelial erosions; DES dry eye syndrome; MGD meibomian gland dysfunction; ATs artificial tears; PFAT's preservative free artificial tears; [place] nuclear sclerotic cataract; PSC posterior subcapsular cataract; ERM epi-retinal  membrane; PVD posterior vitreous detachment; RD retinal detachment; DM diabetes mellitus; DR diabetic retinopathy; NPDR non-proliferative diabetic retinopathy; PDR proliferative diabetic retinopathy; CSME clinically significant macular edema; DME diabetic macular edema; dbh dot blot hemorrhages; CWS cotton wool spot; POAG primary open angle glaucoma; C/D cup-to-disc ratio; HVF humphrey visual field; GVF goldmann visual field; OCT optical coherence tomography; IOP intraocular pressure; BRVO Branch retinal vein occlusion; CRVO central retinal vein occlusion; CRAO central retinal artery occlusion; BRAO branch retinal artery occlusion; RT retinal tear; SB scleral buckle; PPV pars plana vitrectomy; VH Vitreous hemorrhage; PRP panretinal laser photocoagulation; IVK intravitreal kenalog; VMT vitreomacular traction; MH Macular hole;  NVD neovascularization of the disc; NVE neovascularization elsewhere; AREDS age related eye disease study; ARMD age related macular degeneration; POAG primary open angle glaucoma; EBMD epithelial/anterior basement membrane dystrophy; ACIOL anterior chamber intraocular lens; IOL intraocular lens; PCIOL posterior chamber intraocular lens; Phaco/IOL phacoemulsification with intraocular lens placement; [place] photorefractive keratectomy; LASIK laser assisted in situ keratomileusis; HTN hypertension; DM diabetes mellitus; COPD chronic obstructive pulmonary disease

## 2020-01-21 ENCOUNTER — Encounter (INDEPENDENT_AMBULATORY_CARE_PROVIDER_SITE_OTHER): Payer: Self-pay | Admitting: Ophthalmology

## 2020-01-21 MED ORDER — BEVACIZUMAB CHEMO INJECTION 1.25MG/0.05ML SYRINGE FOR KALEIDOSCOPE
1.2500 mg | INTRAVITREAL | Status: AC | PRN
  Administered 2020-01-21: 1.25 mg via INTRAVITREAL

## 2020-02-01 DIAGNOSIS — D72829 Elevated white blood cell count, unspecified: Secondary | ICD-10-CM | POA: Diagnosis not present

## 2020-02-01 DIAGNOSIS — E039 Hypothyroidism, unspecified: Secondary | ICD-10-CM | POA: Diagnosis not present

## 2020-02-08 DIAGNOSIS — M545 Low back pain: Secondary | ICD-10-CM | POA: Diagnosis not present

## 2020-02-08 DIAGNOSIS — G4733 Obstructive sleep apnea (adult) (pediatric): Secondary | ICD-10-CM | POA: Diagnosis not present

## 2020-02-09 ENCOUNTER — Other Ambulatory Visit: Payer: Self-pay | Admitting: Nurse Practitioner

## 2020-02-09 DIAGNOSIS — G4733 Obstructive sleep apnea (adult) (pediatric): Secondary | ICD-10-CM | POA: Diagnosis not present

## 2020-02-10 MED ORDER — POTASSIUM CHLORIDE CRYS ER 10 MEQ PO TBCR: 10.0000 meq | EXTENDED_RELEASE_TABLET | Freq: Every day | ORAL | 0 refills | Status: DC

## 2020-02-11 ENCOUNTER — Other Ambulatory Visit: Payer: Self-pay | Admitting: Internal Medicine

## 2020-02-15 ENCOUNTER — Telehealth: Payer: Self-pay | Admitting: Cardiology

## 2020-02-15 NOTE — Telephone Encounter (Signed)
Andris Flurry from [facility] is calling to confirm which prescription is correct. She wants to know if patient needs to stay on the potassium chloride (K-DUR) 10 MEQ tablet which was noted on an office visit note from 02/01/2019 or potassium chloride (KLOR-CON) 10 MEQ tablet. Reference # for prescription: [id number]. Please call and advise.

## 2020-02-15 NOTE — Telephone Encounter (Signed)
I spoke with pharmacist at [facility].  She reports patient has a long history of taking KClor-Con M 10 which is the generic for K-Dur. This  prescription was ordered by Chanetta Marshall, NP.  Most [facility] was for plain Klor Con.  Due to the difference in formulation pharmacist is requesting clarification before prescription can be filled.  It was not written as dispense as written.   Call back number for pharmacist is [phone].

## 2020-02-16 ENCOUNTER — Telehealth: Payer: Self-pay | Admitting: Internal Medicine

## 2020-02-16 DIAGNOSIS — D2261 Melanocytic nevi of right upper limb, including shoulder: Secondary | ICD-10-CM | POA: Diagnosis not present

## 2020-02-16 DIAGNOSIS — D225 Melanocytic nevi of trunk: Secondary | ICD-10-CM | POA: Diagnosis not present

## 2020-02-16 DIAGNOSIS — Z85828 Personal history of other malignant neoplasm of skin: Secondary | ICD-10-CM | POA: Diagnosis not present

## 2020-02-16 DIAGNOSIS — L821 Other seborrheic keratosis: Secondary | ICD-10-CM | POA: Diagnosis not present

## 2020-02-16 DIAGNOSIS — L720 Epidermal cyst: Secondary | ICD-10-CM | POA: Diagnosis not present

## 2020-02-16 DIAGNOSIS — L570 Actinic keratosis: Secondary | ICD-10-CM | POA: Diagnosis not present

## 2020-02-16 MED ORDER — POTASSIUM CHLORIDE CRYS ER 10 MEQ PO TBCR: 10.0000 meq | EXTENDED_RELEASE_TABLET | Freq: Every day | ORAL | 0 refills | Status: DC

## 2020-02-16 NOTE — Telephone Encounter (Signed)
*  STAT* If patient is at the pharmacy, call can be transferred to refill team.   1. Which medications need to be refilled? (please list name of each medication and dose if known) potassium chloride (KLOR-CON) 10 MEQ tablet  2. Which pharmacy/location (including street and city if local pharmacy) is medication to be sent to? [facility] [place] [place] [facility]  3. Do they need a 30 day or 90 day supply? 90 day   Patient is out of medication. She is scheduled 03/14/2020

## 2020-02-16 NOTE — Telephone Encounter (Signed)
Pt's medication was sent to pt's pharmacy as requested. Confirmation received.  °

## 2020-02-16 NOTE — Telephone Encounter (Signed)
Returned call to pharmacy  Advised to fill potassium

## 2020-02-22 NOTE — Progress Notes (Signed)
[facility] Note  02/23/2020     CHIEF COMPLAINT Patient presents for Retina Follow Up   HISTORY OF PRESENT ILLNESS: Carly Romero is a 74 y.o. female who presents to the clinic today for:   HPI    Retina Follow Up    Patient presents with  CRVO/BRVO.  In right eye.  This started months ago.  Severity is moderate.  Duration of 5 weeks.  Since onset it is stable.  I, the attending physician,  performed the HPI with the patient and updated documentation appropriately.          Comments    74 y/o female pt here for 5 wk f/u for CRVO w/mac edema OD.  Feels VA OD is "terrible," but unsure if vision has changed any.  No change in [place] OS.  Denies pain, FOL, floaters.  "Restasis-like gtt" BID OU.       Last edited by Bernarda Caffey, MD on 02/23/2020  4:16 PM. (History)    pt states she is doing well, she has an appt with Dr. Kathlen Mody next week  Referring physician: Aretta Nip, MD [place],  [place] [address]  HISTORICAL INFORMATION:   Selected notes from the MEDICAL RECORD NUMBER Referral from Dr. Kathlen Mody for eval of CRVO w/edema OD.     CURRENT MEDICATIONS: Current Outpatient Medications (Ophthalmic Drugs)  Medication Sig   fluorometholone (FML) 0.1 % ophthalmic suspension  (Patient not taking: Reported on 02/23/2020)   Polyethyl Glycol-Propyl Glycol (SYSTANE OP) Place 1 drop into both eyes daily as needed (dry eyes). (Patient not taking: Reported on 02/23/2020)   No current facility-administered medications for this visit. (Ophthalmic Drugs)   Current Outpatient Medications (Other)  Medication Sig   albuterol (VENTOLIN HFA) 108 (90 Base) MCG/ACT inhaler Inhale 2 puffs into the lungs every 6 (six) hours as needed for wheezing or shortness of breath.   BIOTIN PO Take 1 tablet by mouth daily.   Calcium Carbonate-Vitamin D (CALCIUM + D PO) Take 1 tablet by mouth daily.   diltiazem (CARDIZEM CD) 240 MG 24 hr capsule Take 1 capsule  (240 mg total) by mouth daily.   ELIQUIS 5 MG TABS tablet Take 1 tablet (5 mg total) by mouth 2 (two) times daily.   FLUoxetine (PROZAC) 20 MG capsule Take 20 mg by mouth daily.   Fluoxetine HCl, PMDD, 20 MG TABS Take by mouth.   furosemide (LASIX) 40 MG tablet Take 1 tablet (40 mg total) by mouth daily.   levothyroxine (SYNTHROID) 125 MCG tablet Take 125 mcg by mouth daily.   levothyroxine (SYNTHROID, LEVOTHROID) 100 MCG tablet Take 150 mcg by mouth daily before breakfast.    loratadine (CLARITIN) 10 MG tablet Take 10 mg by mouth daily as needed for allergies.    losartan (COZAAR) 50 MG tablet Take 1 tablet (50 mg total) by mouth daily.   metoprolol tartrate (LOPRESSOR) 50 MG tablet Take 1 tablet (50 mg total) by mouth 2 (two) times daily.   Multiple Vitamin (MULTIVITAMIN WITH MINERALS) TABS tablet Take 1 tablet by mouth daily.   potassium chloride (KLOR-CON) 10 MEQ tablet Take 1 tablet (10 mEq total) by mouth daily. Please keep upcoming appt in July with Dr. Lovena Le before anymore refills. Thank you   rosuvastatin (CRESTOR) 20 MG tablet Take 20 mg by mouth daily.   SHINGRIX injection    Tiotropium Bromide-Olodaterol (STIOLTO RESPIMAT) 2.5-2.5 MCG/ACT AERS Inhale 2 puffs into the lungs daily.  traMADol (ULTRAM) 50 MG tablet Take 50 mg by mouth every 6 (six) hours as needed.   No current facility-administered medications for this visit. (Other)      REVIEW OF SYSTEMS: ROS    Positive for: Cardiovascular, Eyes, Respiratory   Negative for: Constitutional, Gastrointestinal, Neurological, Skin, Genitourinary, Musculoskeletal, HENT, Endocrine, Psychiatric, Allergic/Imm, Heme/Lymph   Last edited by Matthew Folks, COA on 02/23/2020  2:43 PM. (History)       ALLERGIES Allergies  Allergen Reactions   Amiodarone Other (See Comments)    alopecia   Chantix [Varenicline]     Suicidal thoughts   Codeine Swelling    Swelling as a young adult   Lipitor [Atorvastatin]  Diarrhea    And cramping.    PAST MEDICAL HISTORY Past Medical History:  Diagnosis Date   Atrial fibrillation ([place])    Factor V Leiden ([place])    Hyperlipidemia    Hypertension    Hypertensive heart disease    Hypertensive retinopathy    OU   Hypothyroidism    Macular degeneration    Dry OU   Obesity (BMI 30-39.9)    Thyroid disease    Past Surgical History:  Procedure Laterality Date   ATRIAL FIBRILLATION ABLATION  12/2014   Dr Glennon Mac at [facility]  08/2015   Dr Glennon Mac at [facility] 05/26/2014   Procedure: CARDIOVERSION;  Surgeon: Jacolyn Reedy, MD;  Location: [facility];  Service: Cardiovascular;  Laterality: N/A;   CARDIOVERSION N/A 09/14/2014   Procedure: CARDIOVERSION;  Surgeon: Jacolyn Reedy, MD;  Location: [facility] ENDOSCOPY;  Service: Cardiovascular;  Laterality: N/A;   CARDIOVERSION N/A 05/11/2015   Procedure: CARDIOVERSION;  Surgeon: Jacolyn Reedy, MD;  Location: [facility];  Service: Cardiovascular;  Laterality: N/A;   CATARACT EXTRACTION Bilateral    COSMETIC SURGERY     lower lids   DENTAL SURGERY     wisdom teeth   EYE SURGERY Bilateral    Cat Sx OU   PACEMAKER IMPLANT N/A 02/26/2018   Procedure: PACEMAKER IMPLANT;  Surgeon: Evans Lance, MD;  Location: [facility];  Service: Cardiovascular;  Laterality: N/A;    FAMILY HISTORY Family History  Problem Relation Age of Onset   Diabetes Brother     SOCIAL HISTORY Social History   Tobacco Use   Smoking status: Former Smoker    Packs/day: 1.00    Years: 40.00    Pack years: 40.00    Quit date: 12/11/2013    Years since quitting: 6.2   Smokeless tobacco: Never Used  Substance Use Topics   Alcohol use: Yes    Alcohol/week: 8.0 standard drinks    Types: 8 Glasses of wine per week   Drug use: No         OPHTHALMIC EXAM:  Base Eye Exam    Visual Acuity (Snellen - Linear)      Right Left   Dist cc 20/40 20/20 -2    Dist ph cc NI    Correction: Glasses       Tonometry (Tonopen, 2:54 PM)      Right Left   Pressure 14 17       Pupils      Dark Light Shape React APD   Right 3 2 Round Brisk None   Left 3 2 Round Brisk None       Visual Fields (Counting fingers)      Left Right    Full Full  Extraocular Movement      Right Left    Full, Ortho Full, Ortho       Neuro/Psych    Oriented x3: Yes   Mood/Affect: Normal       Dilation    Both eyes: 1.0% Mydriacyl, 2.5% Phenylephrine @ 2:54 PM        Slit Lamp and Fundus Exam    Slit Lamp Exam      Right Left   Lids/Lashes Dermatochalasis - upper lid Dermatochalasis - upper lid   Conjunctiva/Sclera White and quiet White and quiet   Cornea 1+ PEE, mild arcus, well healed temporal cataract wound 1+ PEE, mild arcus, well healed temporal cataract wound   Anterior Chamber deep and clear deep and clear   Iris round and dilated round and dilated   Lens PC IOL in perfect position, trace PCO PC IOL in perfect position, trace PCO   Vitreous syneresis syneresis       Fundus Exam      Right Left   Disc Optic disc edema - improved , 360 flame hemes, CWS superiorly -- all improving, focal fibrosis superior disc, hyperemia compact, mild tilt, pink and sharp   C/D Ratio 0.0 0.4   Macula good foveal reflex, trace, persistent cystic changes, scattered IRH/DBH - improving, focal collection of flame hemes and CWS just inside IT arcades -- resolved flat, good foveal reflex, mild RPE mottling, trace drusen, no heme or edema   Vessels Tortuous, Dilated, +CRVO attenuated, tortuous, mild A/V crossing changes   Periphery Attached, scattered IRH - improved, mild reticular degeneration attached, mild reticular degeneration          IMAGING AND PROCEDURES  Imaging and Procedures for _0 @  OCT, Retina - OU - Both Eyes       Right Eye Quality was good. Central Foveal Thickness: 262. Progression has improved. Findings include intraretinal fluid,  normal foveal contour, no SRF (persistent cystic changes - slightly improved and disc edema).   Left Eye Quality was good. Central Foveal Thickness: 248. Progression has been stable. Findings include normal foveal contour, no IRF, no SRF, retinal drusen  (Trace drusen).   Notes *Images captured and stored on drive  Diagnosis / Impression:  OD: CRVO w/ CME: persistent cystic changes - slightly improved and disc edema OS: NFP, no IRF/SRF  Clinical management:  See below  Abbreviations: NFP - Normal foveal profile. CME - cystoid macular edema. PED - pigment epithelial detachment. IRF - intraretinal fluid. SRF - subretinal fluid. EZ - ellipsoid zone. ERM - epiretinal membrane. ORA - outer retinal atrophy. ORT - outer retinal tubulation. SRHM - subretinal hyper-reflective material        Intravitreal Injection, Pharmacologic Agent - OD - Right Eye       Time Out 02/23/2020. 3:27 PM. Confirmed correct patient, procedure, site, and patient consented.   Anesthesia Topical anesthesia was used. Anesthetic medications included Lidocaine 2%, Proparacaine 0.5%.   Procedure Preparation included 5% betadine to ocular surface, eyelid speculum. A supplied needle was used.   Injection:  1.25 mg Bevacizumab (AVASTIN) SOLN   NDC: 20355-974-16, Lot: [id number], Expiration date: 04/19/2020   Route: Intravitreal, Site: Right Eye, Waste: 0 mg  Post-op Post injection exam found visual acuity of at least counting fingers. The patient tolerated the procedure well. There were no complications. The patient received written and verbal post procedure care education.                 ASSESSMENT/PLAN:    ICD-10-CM  1. Central retinal vein occlusion with macular edema of right eye  H34.8110   2. Retinal edema  H35.81 OCT, Retina - OU - Both Eyes    Intravitreal Injection, Pharmacologic Agent - OD - Right Eye  3. Factor V Leiden ([place])  D68.51   4. Essential hypertension  I10   5. Hypertensive  retinopathy of both eyes  H35.033   6. Pseudophakia of both eyes  Z96.1     1-3. CRVO with macular edema OD        History of Factor V Leiden on Eliquis  - pt reports history of blood clots / DVTs  - significant CV history with A fib s/p ablation and pacemaker implantation             - s/p IVA OD # 1 (03.09.21), #2 (04.06.21), #3 (5.4.21), #4 (06.01.21)  - FA (04.06.21) shows delayed filling time, hyperfluorescence of the disc, mild focal leakage along IT arcades, mild focal leakage perifovea  - OCT today shows persistent cystic changes disc edema - both slightly improved  - BCVA stable at 20/40 today -- ?new baseline  - recommend IVA OD #5 today, 07.07.21  - pt wishes to proceed  - RBA of procedure discussed, questions answered  - informed consent obtained  - IVA consent form signed and scanned 03.09.21  - see procedure note  - return 5 weeks DFE, OCT, possible injection  4,5. Hypertensive retinopathy OU  - discussed importance of tight BP control  - monitor  6. Pseudophakia OU  - s/p CE/IOL OU w/ Dr. Valetta Close  - IOLs in good position   - monitor   Ophthalmic Meds Ordered this visit:  No orders of the defined types were placed in this encounter.      Return in about 5 weeks (around 03/29/2020) for f/u CRVO OD, DFE, OCT.  There are no Patient Instructions on file for this visit.   Explained the diagnoses, plan, and follow up with the patient and they expressed understanding.  Patient expressed understanding of the importance of proper follow up care.  This document serves as a record of services personally performed by Gardiner Sleeper, MD, PhD. It was created on their behalf by Roselee Nova, COMT. The creation of this record is the provider's dictation and/or activities during the visit.  Electronically signed by: Roselee Nova, COMT 02/23/20 4:16 PM  Gardiner Sleeper, M.D., Ph.D. Diseases & Surgery of the Retina and Vitreous [facility]  I have  reviewed the above documentation for accuracy and completeness, and I agree with the above. Gardiner Sleeper, M.D., Ph.D. 02/23/20 4:19 PM   Abbreviations: M myopia (nearsighted); A astigmatism; H hyperopia (farsighted); P presbyopia; Mrx spectacle prescription;  CTL contact lenses; OD right eye; OS left eye; OU both eyes  XT exotropia; ET esotropia; PEK punctate epithelial keratitis; PEE punctate epithelial erosions; DES dry eye syndrome; MGD meibomian gland dysfunction; ATs artificial tears; PFAT's preservative free artificial tears; [place] nuclear sclerotic cataract; PSC posterior subcapsular cataract; ERM epi-retinal membrane; PVD posterior vitreous detachment; RD retinal detachment; DM diabetes mellitus; DR diabetic retinopathy; NPDR non-proliferative diabetic retinopathy; PDR proliferative diabetic retinopathy; CSME clinically significant macular edema; DME diabetic macular edema; dbh dot blot hemorrhages; CWS cotton wool spot; POAG primary open angle glaucoma; C/D cup-to-disc ratio; HVF humphrey visual field; GVF goldmann visual field; OCT optical coherence tomography; IOP intraocular pressure; BRVO Branch retinal vein occlusion; CRVO central retinal vein occlusion; CRAO central retinal artery occlusion; BRAO branch  retinal artery occlusion; RT retinal tear; SB scleral buckle; PPV pars plana vitrectomy; VH Vitreous hemorrhage; PRP panretinal laser photocoagulation; IVK intravitreal kenalog; VMT vitreomacular traction; MH Macular hole;  NVD neovascularization of the disc; NVE neovascularization elsewhere; AREDS age related eye disease study; ARMD age related macular degeneration; POAG primary open angle glaucoma; EBMD epithelial/anterior basement membrane dystrophy; ACIOL anterior chamber intraocular lens; IOL intraocular lens; PCIOL posterior chamber intraocular lens; Phaco/IOL phacoemulsification with intraocular lens placement; [place] photorefractive keratectomy; LASIK laser assisted in situ keratomileusis;  HTN hypertension; DM diabetes mellitus; COPD chronic obstructive pulmonary disease

## 2020-02-23 ENCOUNTER — Ambulatory Visit (INDEPENDENT_AMBULATORY_CARE_PROVIDER_SITE_OTHER): Payer: Medicare HMO | Admitting: Ophthalmology

## 2020-02-23 ENCOUNTER — Encounter (INDEPENDENT_AMBULATORY_CARE_PROVIDER_SITE_OTHER): Payer: Self-pay | Admitting: Ophthalmology

## 2020-02-23 ENCOUNTER — Other Ambulatory Visit: Payer: Self-pay

## 2020-02-23 DIAGNOSIS — H34811 Central retinal vein occlusion, right eye, with macular edema: Secondary | ICD-10-CM | POA: Diagnosis not present

## 2020-02-23 DIAGNOSIS — D6851 Activated protein C resistance: Secondary | ICD-10-CM | POA: Diagnosis not present

## 2020-02-23 DIAGNOSIS — H35033 Hypertensive retinopathy, bilateral: Secondary | ICD-10-CM | POA: Diagnosis not present

## 2020-02-23 DIAGNOSIS — I10 Essential (primary) hypertension: Secondary | ICD-10-CM

## 2020-02-23 DIAGNOSIS — H3581 Retinal edema: Secondary | ICD-10-CM | POA: Diagnosis not present

## 2020-02-23 DIAGNOSIS — Z961 Presence of intraocular lens: Secondary | ICD-10-CM | POA: Diagnosis not present

## 2020-02-23 MED ORDER — BEVACIZUMAB CHEMO INJECTION 1.25MG/0.05ML SYRINGE FOR KALEIDOSCOPE
1.2500 mg | INTRAVITREAL | Status: AC | PRN
  Administered 2020-02-23: 1.25 mg via INTRAVITREAL

## 2020-02-29 DIAGNOSIS — H16223 Keratoconjunctivitis sicca, not specified as Sjogren's, bilateral: Secondary | ICD-10-CM | POA: Diagnosis not present

## 2020-02-29 DIAGNOSIS — H26493 Other secondary cataract, bilateral: Secondary | ICD-10-CM | POA: Diagnosis not present

## 2020-02-29 DIAGNOSIS — H04123 Dry eye syndrome of bilateral lacrimal glands: Secondary | ICD-10-CM | POA: Diagnosis not present

## 2020-02-29 DIAGNOSIS — H18593 Other hereditary corneal dystrophies, bilateral: Secondary | ICD-10-CM | POA: Diagnosis not present

## 2020-03-14 ENCOUNTER — Encounter: Payer: Medicare HMO | Admitting: Internal Medicine

## 2020-03-14 DIAGNOSIS — M1990 Unspecified osteoarthritis, unspecified site: Secondary | ICD-10-CM | POA: Diagnosis not present

## 2020-03-14 DIAGNOSIS — R69 Illness, unspecified: Secondary | ICD-10-CM | POA: Diagnosis not present

## 2020-03-14 DIAGNOSIS — I4891 Unspecified atrial fibrillation: Secondary | ICD-10-CM | POA: Diagnosis not present

## 2020-03-14 DIAGNOSIS — E7800 Pure hypercholesterolemia, unspecified: Secondary | ICD-10-CM | POA: Diagnosis not present

## 2020-03-14 DIAGNOSIS — I480 Paroxysmal atrial fibrillation: Secondary | ICD-10-CM | POA: Diagnosis not present

## 2020-03-14 DIAGNOSIS — I10 Essential (primary) hypertension: Secondary | ICD-10-CM | POA: Diagnosis not present

## 2020-03-14 DIAGNOSIS — J449 Chronic obstructive pulmonary disease, unspecified: Secondary | ICD-10-CM | POA: Diagnosis not present

## 2020-03-14 DIAGNOSIS — E039 Hypothyroidism, unspecified: Secondary | ICD-10-CM | POA: Diagnosis not present

## 2020-03-14 DIAGNOSIS — H35033 Hypertensive retinopathy, bilateral: Secondary | ICD-10-CM | POA: Diagnosis not present

## 2020-03-20 DIAGNOSIS — T148XXA Other injury of unspecified body region, initial encounter: Secondary | ICD-10-CM | POA: Diagnosis not present

## 2020-03-20 DIAGNOSIS — W108XXA Fall (on) (from) other stairs and steps, initial encounter: Secondary | ICD-10-CM | POA: Diagnosis not present

## 2020-03-20 DIAGNOSIS — M25562 Pain in left knee: Secondary | ICD-10-CM | POA: Diagnosis not present

## 2020-03-21 ENCOUNTER — Other Ambulatory Visit (HOSPITAL_COMMUNITY): Payer: Self-pay | Admitting: Orthopedic Surgery

## 2020-03-21 DIAGNOSIS — M25562 Pain in left knee: Secondary | ICD-10-CM

## 2020-03-21 DIAGNOSIS — S92355A Nondisplaced fracture of fifth metatarsal bone, left foot, initial encounter for closed fracture: Secondary | ICD-10-CM | POA: Diagnosis not present

## 2020-03-27 ENCOUNTER — Ambulatory Visit (INDEPENDENT_AMBULATORY_CARE_PROVIDER_SITE_OTHER): Payer: Medicare HMO | Admitting: *Deleted

## 2020-03-27 DIAGNOSIS — I10 Essential (primary) hypertension: Secondary | ICD-10-CM | POA: Diagnosis not present

## 2020-03-27 DIAGNOSIS — R69 Illness, unspecified: Secondary | ICD-10-CM | POA: Diagnosis not present

## 2020-03-27 DIAGNOSIS — I495 Sick sinus syndrome: Secondary | ICD-10-CM

## 2020-03-27 DIAGNOSIS — M1990 Unspecified osteoarthritis, unspecified site: Secondary | ICD-10-CM | POA: Diagnosis not present

## 2020-03-27 DIAGNOSIS — E7800 Pure hypercholesterolemia, unspecified: Secondary | ICD-10-CM | POA: Diagnosis not present

## 2020-03-27 DIAGNOSIS — I4891 Unspecified atrial fibrillation: Secondary | ICD-10-CM | POA: Diagnosis not present

## 2020-03-27 DIAGNOSIS — J449 Chronic obstructive pulmonary disease, unspecified: Secondary | ICD-10-CM | POA: Diagnosis not present

## 2020-03-27 DIAGNOSIS — H35033 Hypertensive retinopathy, bilateral: Secondary | ICD-10-CM | POA: Diagnosis not present

## 2020-03-27 DIAGNOSIS — E039 Hypothyroidism, unspecified: Secondary | ICD-10-CM | POA: Diagnosis not present

## 2020-03-27 DIAGNOSIS — I480 Paroxysmal atrial fibrillation: Secondary | ICD-10-CM | POA: Diagnosis not present

## 2020-03-28 NOTE — Progress Notes (Signed)
[facility] Note  03/29/2020     CHIEF COMPLAINT Patient presents for Retina Follow Up   HISTORY OF PRESENT ILLNESS: Carly Romero is a 74 y.o. female who presents to the clinic today for:   HPI    Retina Follow Up    Patient presents with  CRVO/BRVO.  In right eye.  This started months ago.  Severity is moderate.  Duration of 5 weeks.  Since onset it is stable.  I, the attending physician,  performed the HPI with the patient and updated documentation appropriately.          Comments    74 y/o female pt here for 5 wk f/u for CRVO OD.  No change in [place] OU.  Denies pain, FOL, floaters.  Restasis BID OU.       Last edited by Bernarda Caffey, MD on 03/29/2020  5:09 PM. (History)    Pt reports no noticed change in [name].  Referring physician: Hortencia Pilar, MD [place],  [place] [id number]  HISTORICAL INFORMATION:   Selected notes from the MEDICAL RECORD NUMBER Referral from Dr. Kathlen Mody for eval of CRVO w/edema OD.     CURRENT MEDICATIONS: Current Outpatient Medications (Ophthalmic Drugs)  Medication Sig  . fluorometholone (FML) 0.1 % ophthalmic suspension  (Patient not taking: Reported on 03/29/2020)  . Polyethyl Glycol-Propyl Glycol (SYSTANE OP) Place 1 drop into both eyes daily as needed (dry eyes).  (Patient not taking: Reported on 03/29/2020)   No current facility-administered medications for this visit. (Ophthalmic Drugs)   Current Outpatient Medications (Other)  Medication Sig  . albuterol (VENTOLIN HFA) 108 (90 Base) MCG/ACT inhaler Inhale 2 puffs into the lungs every 6 (six) hours as needed for wheezing or shortness of breath.  Marland Kitchen BIOTIN PO Take 1 tablet by mouth daily.  . Calcium Carbonate-Vitamin D (CALCIUM + D PO) Take 1 tablet by mouth daily.  Marland Kitchen diltiazem (CARDIZEM CD) 240 MG 24 hr capsule Take 1 capsule (240 mg total) by mouth daily.  Marland Kitchen ELIQUIS 5 MG TABS tablet Take 1 tablet (5 mg total) by mouth 2 (two) times  daily.  Marland Kitchen FLUoxetine (PROZAC) 20 MG capsule Take 20 mg by mouth daily.  . Fluoxetine HCl, PMDD, 20 MG TABS Take by mouth.  . furosemide (LASIX) 40 MG tablet Take 1 tablet (40 mg total) by mouth daily.  Marland Kitchen levothyroxine (SYNTHROID) 125 MCG tablet Take 125 mcg by mouth daily.  Marland Kitchen levothyroxine (SYNTHROID, LEVOTHROID) 100 MCG tablet Take 150 mcg by mouth daily before breakfast.   . loratadine (CLARITIN) 10 MG tablet Take 10 mg by mouth daily as needed for allergies.   Marland Kitchen losartan (COZAAR) 50 MG tablet Take 1 tablet (50 mg total) by mouth daily.  . metoprolol tartrate (LOPRESSOR) 50 MG tablet Take 1 tablet (50 mg total) by mouth 2 (two) times daily.  . Multiple Vitamin (MULTIVITAMIN WITH MINERALS) TABS tablet Take 1 tablet by mouth daily.  . potassium chloride (KLOR-CON) 10 MEQ tablet Take 1 tablet (10 mEq total) by mouth daily. Please keep upcoming appt in July with Dr. Lovena Le before anymore refills. Thank you  . rosuvastatin (CRESTOR) 20 MG tablet Take 20 mg by mouth daily.  Marland Kitchen SHINGRIX injection   . Tiotropium Bromide-Olodaterol (STIOLTO RESPIMAT) 2.5-2.5 MCG/ACT AERS Inhale 2 puffs into the lungs daily.  . traMADol (ULTRAM) 50 MG tablet Take 50 mg by mouth every 6 (six) hours as needed.  . potassium chloride (KLOR-CON)  10 MEQ tablet Take by mouth. (Patient not taking: Reported on 03/29/2020)   No current facility-administered medications for this visit. (Other)      REVIEW OF SYSTEMS: ROS    Positive for: Cardiovascular, Eyes, Respiratory   Negative for: Constitutional, Gastrointestinal, Neurological, Skin, Genitourinary, Musculoskeletal, HENT, Endocrine, Psychiatric, Allergic/Imm, Heme/Lymph   Last edited by Matthew Folks, COA on 03/29/2020  2:28 PM. (History)       ALLERGIES Allergies  Allergen Reactions  . Amiodarone Other (See Comments)    alopecia  . Chantix [Varenicline]     Suicidal thoughts  . Codeine Swelling    Swelling as a young adult  . Lipitor [Atorvastatin]  Diarrhea    And cramping.    PAST MEDICAL HISTORY Past Medical History:  Diagnosis Date  . Atrial fibrillation ([place])   . Factor V Leiden ([place])   . Hyperlipidemia   . Hypertension   . Hypertensive heart disease   . Hypertensive retinopathy    OU  . Hypothyroidism   . Macular degeneration    Dry OU  . Obesity (BMI 30-39.9)   . Thyroid disease    Past Surgical History:  Procedure Laterality Date  . ATRIAL FIBRILLATION ABLATION  12/2014   Dr Glennon Mac at [facility]  . ATRIAL FIBRILLATION ABLATION  08/2015   Dr Glennon Mac at [facility]  . CARDIOVERSION N/A 05/26/2014   Procedure: CARDIOVERSION;  Surgeon: Jacolyn Reedy, MD;  Location: [facility] ENDOSCOPY;  Service: Cardiovascular;  Laterality: N/A;  . CARDIOVERSION N/A 09/14/2014   Procedure: CARDIOVERSION;  Surgeon: Jacolyn Reedy, MD;  Location: [facility] ENDOSCOPY;  Service: Cardiovascular;  Laterality: N/A;  . CARDIOVERSION N/A 05/11/2015   Procedure: CARDIOVERSION;  Surgeon: Jacolyn Reedy, MD;  Location: [place];  Service: Cardiovascular;  Laterality: N/A;  . CATARACT EXTRACTION Bilateral   . COSMETIC SURGERY     lower lids  . DENTAL SURGERY     wisdom teeth  . EYE SURGERY Bilateral    Cat Sx OU  . PACEMAKER IMPLANT N/A 02/26/2018   Procedure: PACEMAKER IMPLANT;  Surgeon: Evans Lance, MD;  Location: [place] CV LAB;  Service: Cardiovascular;  Laterality: N/A;    FAMILY HISTORY Family History  Problem Relation Age of Onset  . Diabetes Brother     SOCIAL HISTORY Social History   Tobacco Use  . Smoking status: Former Smoker    Packs/day: 1.00    Years: 40.00    Pack years: 40.00    Quit date: 12/11/2013    Years since quitting: 6.3  . Smokeless tobacco: Never Used  Substance Use Topics  . Alcohol use: Yes    Alcohol/week: 8.0 standard drinks    Types: 8 Glasses of wine per week  . Drug use: No         OPHTHALMIC EXAM:  Base Eye Exam    Visual Acuity (Snellen - Linear)      Right Left   Dist cc 20/40 - 20/30 -2    Dist ph cc NI 20/25 -2   Correction: Glasses       Tonometry (Tonopen, 2:30 PM)      Right Left   Pressure 14 16       Pupils      Dark Light Shape React APD   Right 3 2 Round Brisk None   Left 3 2 Round Brisk None       Visual Fields (Counting fingers)      Left Right    Full Full  Extraocular Movement      Right Left    Full, Ortho Full, Ortho       Neuro/Psych    Oriented x3: Yes   Mood/Affect: Normal       Dilation    Both eyes: 1.0% Mydriacyl, 2.5% Phenylephrine @ 2:30 PM        Slit Lamp and Fundus Exam    Slit Lamp Exam      Right Left   Lids/Lashes Dermatochalasis - upper lid Dermatochalasis - upper lid   Conjunctiva/Sclera White and quiet White and quiet   Cornea 1+ PEE, mild arcus, well healed temporal cataract wound 1+ PEE, mild arcus, well healed temporal cataract wound   Anterior Chamber deep and clear deep and clear   Iris round and dilated round and dilated   Lens PC IOL in perfect position, trace PCO PC IOL in perfect position, trace PCO   Vitreous syneresis syneresis       Fundus Exam      Right Left   Disc Optic disc edema - improved , 360 flame hemes, CWS superiorly -- all improving, focal fibrosis superior disc, hyperemia compact, mild tilt, pink and sharp   C/D Ratio 0.0 0.4   Macula good foveal reflex, trace, persistent cystic changes, scattered IRH/DBH - improving, focal collection of flame hemes and CWS just inside IT arcades flat, good foveal reflex, mild RPE mottling, trace drusen, no heme or edema   Vessels Tortuous, Dilated, +CRVO attenuated, tortuous, mild A/V crossing changes   Periphery Attached, scattered IRH - improved, mild reticular degeneration attached, mild reticular degeneration          IMAGING AND PROCEDURES  Imaging and Procedures for @TODAY @  OCT, Retina - OU - Both Eyes       Right Eye Quality was good. Central Foveal Thickness: 261. Progression has improved. Findings include intraretinal fluid,  normal foveal contour, no SRF (persistent cystic changes and disc edema--slightly improved).   Left Eye Quality was good. Central Foveal Thickness: 247. Progression has been stable. Findings include normal foveal contour, no IRF, no SRF, retinal drusen  (Trace drusen).   Notes *Images captured and stored on drive  Diagnosis / Impression:  OD: CRVO w/ CME: persistent cystic changes and disc edema - both slightly improved OS: NFP, no IRF/SRF; Trace drusen  Clinical management:  See below  Abbreviations: NFP - Normal foveal profile. CME - cystoid macular edema. PED - pigment epithelial detachment. IRF - intraretinal fluid. SRF - subretinal fluid. EZ - ellipsoid zone. ERM - epiretinal membrane. ORA - outer retinal atrophy. ORT - outer retinal tubulation. SRHM - subretinal hyper-reflective material        Intravitreal Injection, Pharmacologic Agent - OD - Right Eye       Time Out 03/29/2020. 3:09 PM. Confirmed correct patient, procedure, site, and patient consented.   Anesthesia Topical anesthesia was used. Anesthetic medications included Lidocaine 2%, Proparacaine 0.5%.   Procedure Preparation included 5% betadine to ocular surface, eyelid speculum. A (32g) needle was used.   Injection:  1.25 mg Bevacizumab (AVASTIN) SOLN   NDC: 70360-001-02, Lot: [id number], Expiration date: 05/01/2020   Route: Intravitreal, Site: Right Eye, Waste: 0.05 mL  Post-op Post injection exam found visual acuity of at least counting fingers. The patient tolerated the procedure well. There were no complications. The patient received written and verbal post procedure care education. Post injection medications were not given.  ASSESSMENT/PLAN:    ICD-10-CM   1. Central retinal vein occlusion with macular edema of right eye  H34.8110 Intravitreal Injection, Pharmacologic Agent - OD - Right Eye    Bevacizumab (AVASTIN) SOLN 1.25 mg  2. Retinal edema  H35.81 OCT, Retina - OU - Both Eyes   3. Factor V Leiden ([place])  D68.51   4. Essential hypertension  I10   5. Hypertensive retinopathy of both eyes  H35.033   6. Pseudophakia of both eyes  Z96.1     1-3. CRVO with macular edema OD        History of Factor V Leiden on Eliquis  - pt reports history of blood clots / DVTs  - significant CV history with A fib s/p ablation and pacemaker implantation             - s/p IVA OD # 1 (03.09.21), #2 (04.06.21), #3 (5.4.21), #4 (06.01.21), #5 (07.07.21)  - FA (04.06.21) shows delayed filling time, hyperfluorescence of the disc, mild focal leakage along IT arcades, mild focal leakage perifovea  - OCT today shows persistent cystic changes and disc edema - both slightly improved  - BCVA stable at 20/40 today  - recommend IVA OD #6 today, 08.11.2021  - pt wishes to proceed  - RBA of procedure discussed, questions answered  - informed consent obtained  - IVA consent form signed and scanned 03.09.21  - see procedure note  - return 5 weeks DFE, OCT, possible injection  4,5. Hypertensive retinopathy OU  - discussed importance of tight BP control  - monitor  6. Pseudophakia OU  - s/p CE/IOL OU w/ Dr. Valetta Close  - IOLs in good position   - monitor   Ophthalmic Meds Ordered this visit:  Meds ordered this encounter  Medications  . Bevacizumab (AVASTIN) SOLN 1.25 mg       Return in about 5 weeks (around 05/03/2020) for 5 wk f/u for CRVO OD w/DFE/OCT/poss. inj..  There are no Patient Instructions on file for this visit.   Explained the diagnoses, plan, and follow up with the patient and they expressed understanding.  Patient expressed understanding of the importance of proper follow up care.  This document serves as a record of services personally performed by Gardiner Sleeper, MD, PhD. It was created on their behalf by Roselee Nova, COMT. The creation of this record is the provider's dictation and/or activities during the visit.  Electronically signed by: Roselee Nova, COMT 03/29/20  5:10 PM  This document serves as a record of services personally performed by Gardiner Sleeper, MD, PhD. It was created on their behalf by Estill Bakes, COT an ophthalmic technician. The creation of this record is the provider's dictation and/or activities during the visit.    Electronically signed by: Estill Bakes, COT 8.11.21 @ 5:10 PM  Gardiner Sleeper, M.D., Ph.D. Diseases & Surgery of the Retina and Vitreous [facility]  I have reviewed the above documentation for accuracy and completeness, and I agree with the above. Gardiner Sleeper, M.D., Ph.D. 03/29/20 5:10 PM   Abbreviations: M myopia (nearsighted); A astigmatism; H hyperopia (farsighted); P presbyopia; Mrx spectacle prescription;  CTL contact lenses; OD right eye; OS left eye; OU both eyes  XT exotropia; ET esotropia; PEK punctate epithelial keratitis; PEE punctate epithelial erosions; DES dry eye syndrome; MGD meibomian gland dysfunction; ATs artificial tears; PFAT's preservative free artificial tears; [place] nuclear sclerotic cataract; PSC posterior subcapsular cataract; ERM epi-retinal membrane; PVD posterior vitreous detachment;  RD retinal detachment; DM diabetes mellitus; DR diabetic retinopathy; NPDR non-proliferative diabetic retinopathy; PDR proliferative diabetic retinopathy; CSME clinically significant macular edema; DME diabetic macular edema; dbh dot blot hemorrhages; CWS cotton wool spot; POAG primary open angle glaucoma; C/D cup-to-disc ratio; HVF humphrey visual field; GVF goldmann visual field; OCT optical coherence tomography; IOP intraocular pressure; BRVO Branch retinal vein occlusion; CRVO central retinal vein occlusion; CRAO central retinal artery occlusion; BRAO branch retinal artery occlusion; RT retinal tear; SB scleral buckle; PPV pars plana vitrectomy; VH Vitreous hemorrhage; PRP panretinal laser photocoagulation; IVK intravitreal kenalog; VMT vitreomacular traction; MH Macular hole;  NVD  neovascularization of the disc; NVE neovascularization elsewhere; AREDS age related eye disease study; ARMD age related macular degeneration; POAG primary open angle glaucoma; EBMD epithelial/anterior basement membrane dystrophy; ACIOL anterior chamber intraocular lens; IOL intraocular lens; PCIOL posterior chamber intraocular lens; Phaco/IOL phacoemulsification with intraocular lens placement; [place] photorefractive keratectomy; LASIK laser assisted in situ keratomileusis; HTN hypertension; DM diabetes mellitus; COPD chronic obstructive pulmonary disease

## 2020-03-29 ENCOUNTER — Ambulatory Visit (INDEPENDENT_AMBULATORY_CARE_PROVIDER_SITE_OTHER): Payer: Medicare HMO | Admitting: Ophthalmology

## 2020-03-29 ENCOUNTER — Other Ambulatory Visit: Payer: Self-pay

## 2020-03-29 ENCOUNTER — Encounter (INDEPENDENT_AMBULATORY_CARE_PROVIDER_SITE_OTHER): Payer: Self-pay | Admitting: Ophthalmology

## 2020-03-29 DIAGNOSIS — H35033 Hypertensive retinopathy, bilateral: Secondary | ICD-10-CM | POA: Diagnosis not present

## 2020-03-29 DIAGNOSIS — I10 Essential (primary) hypertension: Secondary | ICD-10-CM | POA: Diagnosis not present

## 2020-03-29 DIAGNOSIS — Z961 Presence of intraocular lens: Secondary | ICD-10-CM | POA: Diagnosis not present

## 2020-03-29 DIAGNOSIS — H34811 Central retinal vein occlusion, right eye, with macular edema: Secondary | ICD-10-CM | POA: Diagnosis not present

## 2020-03-29 DIAGNOSIS — D6851 Activated protein C resistance: Secondary | ICD-10-CM

## 2020-03-29 DIAGNOSIS — H3581 Retinal edema: Secondary | ICD-10-CM

## 2020-03-29 LAB — CUP PACEART REMOTE DEVICE CHECK
Battery Remaining Longevity: 122 mo
Battery Remaining Percentage: 95.5 %
Battery Voltage: 3.01 V
Brady Statistic AP VP Percent: 0 %
Brady Statistic AP VS Percent: 0 %
Brady Statistic AS VP Percent: 0 %
Brady Statistic AS VS Percent: 0 %
Brady Statistic RA Percent Paced: 1 %
Brady Statistic RV Percent Paced: 49 %
Date Time Interrogation Session: 20210809020014
Implantable Lead Implant Date: 20190711
Implantable Lead Implant Date: 20190711
Implantable Lead Location: 753859
Implantable Lead Location: 753860
Implantable Pulse Generator Implant Date: 20190711
Lead Channel Impedance Value: 590 Ohm
Lead Channel Impedance Value: 630 Ohm
Lead Channel Pacing Threshold Amplitude: 0.75 V
Lead Channel Pacing Threshold Amplitude: 1 V
Lead Channel Pacing Threshold Pulse Width: 0.4 ms
Lead Channel Pacing Threshold Pulse Width: 0.5 ms
Lead Channel Sensing Intrinsic Amplitude: 1.2 mV
Lead Channel Sensing Intrinsic Amplitude: 12 mV
Lead Channel Setting Pacing Amplitude: 1.75 V
Lead Channel Setting Pacing Amplitude: 2.5 V
Lead Channel Setting Pacing Pulse Width: 0.4 ms
Lead Channel Setting Sensing Sensitivity: 2 mV
Pulse Gen Model: 2272
Pulse Gen Serial Number: 9041042

## 2020-03-29 MED ORDER — BEVACIZUMAB CHEMO INJECTION 1.25MG/0.05ML SYRINGE FOR KALEIDOSCOPE
1.2500 mg | INTRAVITREAL | Status: AC | PRN
  Administered 2020-03-29: 1.25 mg via INTRAVITREAL

## 2020-03-30 ENCOUNTER — Telehealth: Payer: Self-pay | Admitting: *Deleted

## 2020-03-30 NOTE — Telephone Encounter (Signed)
A detailed message was left, re: her follow up visit. 

## 2020-03-30 NOTE — Progress Notes (Signed)
Remote pacemaker transmission.   

## 2020-04-11 ENCOUNTER — Other Ambulatory Visit: Payer: Self-pay

## 2020-04-11 ENCOUNTER — Ambulatory Visit (HOSPITAL_COMMUNITY)
Admission: RE | Admit: 2020-04-11 | Discharge: 2020-04-11 | Disposition: A | Discharge status: Home / Self Care | Payer: Medicare HMO | Source: Ambulatory Visit | Attending: Orthopedic Surgery | Admitting: Orthopedic Surgery

## 2020-04-11 DIAGNOSIS — M25562 Pain in left knee: Secondary | ICD-10-CM | POA: Insufficient documentation

## 2020-04-11 NOTE — Progress Notes (Signed)
[address] Jude rep, Dionne Milo, came and viewed patient's thresholds per [place], [place]. Per Dionne Milo no changes need to be made for patient's pacemaker for MRI.  I will be monitoring patient during MRI and will notify Dionne Milo when scan complete to clear device.

## 2020-04-13 DIAGNOSIS — M25562 Pain in left knee: Secondary | ICD-10-CM | POA: Diagnosis not present

## 2020-04-18 ENCOUNTER — Ambulatory Visit: Payer: Medicare HMO | Admitting: Internal Medicine

## 2020-04-18 ENCOUNTER — Encounter: Payer: Self-pay | Admitting: Internal Medicine

## 2020-04-18 ENCOUNTER — Other Ambulatory Visit: Payer: Self-pay

## 2020-04-18 VITALS — BP 134/78 | HR 67 | Ht 65.0 in | Wt 206.2 lb

## 2020-04-18 DIAGNOSIS — Z950 Presence of cardiac pacemaker: Secondary | ICD-10-CM

## 2020-04-18 DIAGNOSIS — I495 Sick sinus syndrome: Secondary | ICD-10-CM

## 2020-04-18 DIAGNOSIS — I10 Essential (primary) hypertension: Secondary | ICD-10-CM

## 2020-04-18 LAB — CUP PACEART INCLINIC DEVICE CHECK
Battery Remaining Longevity: 133 mo
Battery Voltage: 3.01 V
Brady Statistic RA Percent Paced: 0 %
Brady Statistic RV Percent Paced: 29 %
Date Time Interrogation Session: 20210831165503
Implantable Lead Implant Date: 20190711
Implantable Lead Implant Date: 20190711
Implantable Lead Location: 753859
Implantable Lead Location: 753860
Implantable Pulse Generator Implant Date: 20190711
Lead Channel Impedance Value: 587.5 Ohm
Lead Channel Impedance Value: 625 Ohm
Lead Channel Pacing Threshold Amplitude: 0.75 V
Lead Channel Pacing Threshold Amplitude: 1 V
Lead Channel Pacing Threshold Pulse Width: 0.4 ms
Lead Channel Pacing Threshold Pulse Width: 0.5 ms
Lead Channel Sensing Intrinsic Amplitude: 1.5 mV
Lead Channel Sensing Intrinsic Amplitude: 12 mV
Lead Channel Setting Pacing Amplitude: 2.5 V
Lead Channel Setting Pacing Pulse Width: 0.4 ms
Lead Channel Setting Sensing Sensitivity: 2 mV
Pulse Gen Model: 2272
Pulse Gen Serial Number: 9041042

## 2020-04-18 NOTE — Progress Notes (Signed)
HPI [name] returns today for followup of her PPM and ongoing evaluation and management of PAF. She is a 74 yo woman who has had 2 ablations. She has been maintained on systemic anti-coagulation. Since her PPM was placed back in July, she has done well in the interim with no chest pain. She has occaisional palpitations. No edema. She has lost over 20 lbs. Allergies  Allergen Reactions  . Amiodarone Other (See Comments)    alopecia  . Chantix [Varenicline]     Suicidal thoughts  . Codeine Swelling    Swelling as a young adult  . Lipitor [Atorvastatin] Diarrhea    And cramping.     Current Outpatient Medications  Medication Sig Dispense Refill  . albuterol (VENTOLIN HFA) 108 (90 Base) MCG/ACT inhaler Inhale 2 puffs into the lungs every 6 (six) hours as needed for wheezing or shortness of breath. 18 g 5  . BIOTIN PO Take 1 tablet by mouth daily.    . Calcium Carbonate-Vitamin D (CALCIUM + D PO) Take 1 tablet by mouth daily.    Marland Kitchen diltiazem (CARDIZEM CD) 240 MG 24 hr capsule Take 1 capsule (240 mg total) by mouth daily. 90 capsule 3  . ELIQUIS 5 MG TABS tablet Take 1 tablet (5 mg total) by mouth 2 (two) times daily. 180 tablet 3  . fluorometholone (FML) 0.1 % ophthalmic suspension     . FLUoxetine (PROZAC) 20 MG capsule Take 20 mg by mouth daily.    . Fluoxetine HCl, PMDD, 20 MG TABS Take by mouth.    . furosemide (LASIX) 40 MG tablet Take 1 tablet (40 mg total) by mouth daily. 90 tablet 3  . levothyroxine (SYNTHROID) 125 MCG tablet Take 125 mcg by mouth daily.    Marland Kitchen loratadine (CLARITIN) 10 MG tablet Take 10 mg by mouth daily as needed for allergies.     Marland Kitchen losartan (COZAAR) 50 MG tablet Take 1 tablet (50 mg total) by mouth daily. 30 tablet 3  . metoprolol tartrate (LOPRESSOR) 50 MG tablet Take 1 tablet (50 mg total) by mouth 2 (two) times daily. 180 tablet 3  . Multiple Vitamin (MULTIVITAMIN WITH MINERALS) TABS tablet Take 1 tablet by mouth daily.    Vladimir Faster Glycol-Propyl  Glycol (SYSTANE OP) Place 1 drop into both eyes daily as needed (dry eyes).     . potassium chloride (KLOR-CON) 10 MEQ tablet Take 1 tablet (10 mEq total) by mouth daily. Please keep upcoming appt in July with Dr. Lovena Le before anymore refills. Thank you 90 tablet 0  . potassium chloride (KLOR-CON) 10 MEQ tablet Take by mouth.     Marland Kitchen SHINGRIX injection     . Tiotropium Bromide-Olodaterol (STIOLTO RESPIMAT) 2.5-2.5 MCG/ACT AERS Inhale 2 puffs into the lungs daily. 12 Inhaler 1  . traMADol (ULTRAM) 50 MG tablet Take 50 mg by mouth every 6 (six) hours as needed.    . rosuvastatin (CRESTOR) 40 MG tablet Take 40 mg by mouth daily.     No current facility-administered medications for this visit.     Past Medical History:  Diagnosis Date  . Atrial fibrillation (Carly Romero)   . Factor V Leiden ([place])   . Hyperlipidemia   . Hypertension   . Hypertensive heart disease   . Hypertensive retinopathy    OU  . Hypothyroidism   . Macular degeneration    Dry OU  . Obesity (BMI 30-39.9)   . Thyroid disease     ROS:   All  systems reviewed and negative except as noted in the HPI.   Past Surgical History:  Procedure Laterality Date  . ATRIAL FIBRILLATION ABLATION  12/2014   Dr Glennon Mac at [facility]  . ATRIAL FIBRILLATION ABLATION  08/2015   Dr Glennon Mac at [facility]  . CARDIOVERSION N/A 05/26/2014   Procedure: CARDIOVERSION;  Surgeon: Jacolyn Reedy, MD;  Location: [facility] ENDOSCOPY;  Service: Cardiovascular;  Laterality: N/A;  . CARDIOVERSION N/A 09/14/2014   Procedure: CARDIOVERSION;  Surgeon: Jacolyn Reedy, MD;  Location: [facility] ENDOSCOPY;  Service: Cardiovascular;  Laterality: N/A;  . CARDIOVERSION N/A 05/11/2015   Procedure: CARDIOVERSION;  Surgeon: Jacolyn Reedy, MD;  Location: [place];  Service: Cardiovascular;  Laterality: N/A;  . CATARACT EXTRACTION Bilateral   . COSMETIC SURGERY     lower lids  . DENTAL SURGERY     wisdom teeth  . EYE SURGERY Bilateral    Cat Sx OU  . PACEMAKER IMPLANT N/A  02/26/2018   Procedure: PACEMAKER IMPLANT;  Surgeon: Evans Lance, MD;  Location: [place] CV LAB;  Service: Cardiovascular;  Laterality: N/A;     Family History  Problem Relation Age of Onset  . Diabetes Brother      Social History   Socioeconomic History  . Marital status: Single    Spouse name: Not on file  . Number of children: Not on file  . Years of education: Not on file  . Highest education level: Not on file  Occupational History  . Not on file  Tobacco Use  . Smoking status: Former Smoker    Packs/day: 1.00    Years: 40.00    Pack years: 40.00    Quit date: 12/11/2013    Years since quitting: 6.3  . Smokeless tobacco: Never Used  Substance and Sexual Activity  . Alcohol use: Yes    Alcohol/week: 8.0 standard drinks    Types: 8 Glasses of wine per week  . Drug use: No  . Sexual activity: Never  Other Topics Concern  . Not on file  Social History Narrative  . Not on file   Social Determinants of Health   Financial Resource Strain:   . Difficulty of Paying Living Expenses: Not on file  Food Insecurity:   . Worried About Charity fundraiser in the Last Year: Not on file  . Ran Out of Food in the Last Year: Not on file  Transportation Needs:   . Lack of Transportation (Medical): Not on file  . Lack of Transportation (Non-Medical): Not on file  Physical Activity:   . Days of Exercise per Week: Not on file  . Minutes of Exercise per Session: Not on file  Stress:   . Feeling of Stress : Not on file  Social Connections:   . Frequency of Communication with Friends and Family: Not on file  . Frequency of Social Gatherings with Friends and Family: Not on file  . Attends Religious Services: Not on file  . Active Member of Clubs or Organizations: Not on file  . Attends Archivist Meetings: Not on file  . Marital Status: Not on file  Intimate Partner Violence:   . Fear of Current or Ex-Partner: Not on file  . Emotionally Abused: Not on file  .  Physically Abused: Not on file  . Sexually Abused: Not on file     BP 134/78   Pulse 67   Ht 5\' 5"  (1.651 m)   Wt 206 lb 3.2 oz (93.5 kg)  SpO2 94%   BMI 34.31 kg/m   Physical Exam:  Well appearing NAD HEENT: Unremarkable Neck:  No JVD, no thyromegally Lymphatics:  No adenopathy Back:  No CVA tenderness Lungs:  Clear with no wheezes HEART:  Regular rate rhythm, no murmurs, no rubs, no clicks Abd:  soft, positive bowel sounds, no organomegally, no rebound, no guarding Ext:  2 plus pulses, no edema, no cyanosis, no clubbing Skin:  No rashes no nodules Neuro:  CN II through XII intact, motor grossly intact  EKG - atrial fib with a controlled VR and pacing  DEVICE  Normal device function.  See PaceArt for details.   Assess/Plan: 1. Atrial fib - her VR is well controlled. No change in meds. 2. Obesity - she has lost 20 lbs. She is encouraged to continue 3. PPM - her [employer] PPM has been reprogrammed to VVI as she is chronically in atrial fib. 4. HTN - her bp is fairly well controlled.  Salome Spotted.

## 2020-04-18 NOTE — Patient Instructions (Signed)
Medication Instructions:  Your physician recommends that you continue on your current medications as directed. Please refer to the Current Medication list given to you today.  Labwork: None ordered.  Testing/Procedures: None ordered.  Follow-Up: Your physician wants you to follow-up in: one year with Dr. Lovena Le.   You will receive a reminder letter in the mail two months in advance. If you don't receive a letter, please call our office to schedule the follow-up appointment.  Remote monitoring is used to monitor your Pacemaker from home. This monitoring reduces the number of office visits required to check your device to one time per year. It allows [place] to keep an eye on the functioning of your device to ensure it is working properly. You are scheduled for a device check from home on 06/26/2020. You may send your transmission at any time that day. If you have a wireless device, the transmission will be sent automatically. After your physician reviews your transmission, you will receive a postcard with your next transmission date.  Any Other Special Instructions Will Be Listed Below (If Applicable).  If you need a refill on your cardiac medications before your next appointment, please call your pharmacy.

## 2020-04-20 DIAGNOSIS — H26493 Other secondary cataract, bilateral: Secondary | ICD-10-CM | POA: Diagnosis not present

## 2020-04-20 DIAGNOSIS — H26492 Other secondary cataract, left eye: Secondary | ICD-10-CM | POA: Diagnosis not present

## 2020-04-20 DIAGNOSIS — H04123 Dry eye syndrome of bilateral lacrimal glands: Secondary | ICD-10-CM | POA: Diagnosis not present

## 2020-04-20 DIAGNOSIS — H353131 Nonexudative age-related macular degeneration, bilateral, early dry stage: Secondary | ICD-10-CM | POA: Diagnosis not present

## 2020-04-21 DIAGNOSIS — M6281 Muscle weakness (generalized): Secondary | ICD-10-CM | POA: Diagnosis not present

## 2020-04-21 DIAGNOSIS — S83522D Sprain of posterior cruciate ligament of left knee, subsequent encounter: Secondary | ICD-10-CM | POA: Diagnosis not present

## 2020-04-21 DIAGNOSIS — S82402D Unspecified fracture of shaft of left fibula, subsequent encounter for closed fracture with routine healing: Secondary | ICD-10-CM | POA: Diagnosis not present

## 2020-04-21 DIAGNOSIS — S83412D Sprain of medial collateral ligament of left knee, subsequent encounter: Secondary | ICD-10-CM | POA: Diagnosis not present

## 2020-04-26 DIAGNOSIS — E039 Hypothyroidism, unspecified: Secondary | ICD-10-CM | POA: Diagnosis not present

## 2020-04-26 DIAGNOSIS — I4891 Unspecified atrial fibrillation: Secondary | ICD-10-CM | POA: Diagnosis not present

## 2020-04-26 DIAGNOSIS — I480 Paroxysmal atrial fibrillation: Secondary | ICD-10-CM | POA: Diagnosis not present

## 2020-04-26 DIAGNOSIS — I10 Essential (primary) hypertension: Secondary | ICD-10-CM | POA: Diagnosis not present

## 2020-04-26 DIAGNOSIS — E7800 Pure hypercholesterolemia, unspecified: Secondary | ICD-10-CM | POA: Diagnosis not present

## 2020-04-26 DIAGNOSIS — M1990 Unspecified osteoarthritis, unspecified site: Secondary | ICD-10-CM | POA: Diagnosis not present

## 2020-04-26 DIAGNOSIS — R69 Illness, unspecified: Secondary | ICD-10-CM | POA: Diagnosis not present

## 2020-04-26 DIAGNOSIS — H35033 Hypertensive retinopathy, bilateral: Secondary | ICD-10-CM | POA: Diagnosis not present

## 2020-04-26 DIAGNOSIS — J449 Chronic obstructive pulmonary disease, unspecified: Secondary | ICD-10-CM | POA: Diagnosis not present

## 2020-05-02 NOTE — Progress Notes (Signed)
[facility] Note  05/03/2020     CHIEF COMPLAINT Patient presents for Retina Follow Up   HISTORY OF PRESENT ILLNESS: Carly Romero is a 74 y.o. female who presents to the clinic today for:   HPI    Retina Follow Up    Patient presents with  CRVO/BRVO.  In right eye.  This started months ago.  Severity is moderate.  Duration of 5 weeks.  Since onset it is stable.  I, the attending physician,  performed the HPI with the patient and updated documentation appropriately.          Comments    74 y/o female pt here for 5 wk f/u for CRVO w/mac edema OD.  No change in [place] OU.  Denies pain, FOL, floaters.  AT prn OU.       Last edited by Bernarda Caffey, MD on 05/04/2020  9:11 AM. (History)    Pt states she has had YAG OS with Dr. Kathlen Mody, but wanted to wait on OD for now  Referring physician: Hortencia Pilar, MD [place],  [place] [address]  HISTORICAL INFORMATION:   Selected notes from the MEDICAL RECORD NUMBER Referral from Dr. Kathlen Mody for eval of CRVO w/edema OD.     CURRENT MEDICATIONS: Current Outpatient Medications (Ophthalmic Drugs)  Medication Sig  . fluorometholone (FML) 0.1 % ophthalmic suspension   . Polyethyl Glycol-Propyl Glycol (SYSTANE OP) Place 1 drop into both eyes daily as needed (dry eyes).    No current facility-administered medications for this visit. (Ophthalmic Drugs)   Current Outpatient Medications (Other)  Medication Sig  . albuterol (VENTOLIN HFA) 108 (90 Base) MCG/ACT inhaler Inhale 2 puffs into the lungs every 6 (six) hours as needed for wheezing or shortness of breath.  Marland Kitchen BIOTIN PO Take 1 tablet by mouth daily.  . Calcium Carbonate-Vitamin D (CALCIUM + D PO) Take 1 tablet by mouth daily.  Marland Kitchen diltiazem (CARDIZEM CD) 240 MG 24 hr capsule Take 1 capsule (240 mg total) by mouth daily.  Marland Kitchen ELIQUIS 5 MG TABS tablet Take 1 tablet (5 mg total) by mouth 2 (two) times daily.  Marland Kitchen FLUoxetine (PROZAC) 20 MG  capsule Take 20 mg by mouth daily.  . Fluoxetine HCl, PMDD, 20 MG TABS Take by mouth.  . furosemide (LASIX) 40 MG tablet Take 1 tablet (40 mg total) by mouth daily.  Marland Kitchen levothyroxine (SYNTHROID) 125 MCG tablet Take 125 mcg by mouth daily.  Marland Kitchen loratadine (CLARITIN) 10 MG tablet Take 10 mg by mouth daily as needed for allergies.   Marland Kitchen losartan (COZAAR) 50 MG tablet Take 1 tablet (50 mg total) by mouth daily.  . metoprolol tartrate (LOPRESSOR) 50 MG tablet Take 1 tablet (50 mg total) by mouth 2 (two) times daily.  . Multiple Vitamin (MULTIVITAMIN WITH MINERALS) TABS tablet Take 1 tablet by mouth daily.  . potassium chloride (KLOR-CON) 10 MEQ tablet Take 1 tablet (10 mEq total) by mouth daily. Please keep upcoming appt in July with Dr. Lovena Le before anymore refills. Thank you  . potassium chloride (KLOR-CON) 10 MEQ tablet Take by mouth.   . rosuvastatin (CRESTOR) 40 MG tablet Take 40 mg by mouth daily.  Marland Kitchen SHINGRIX injection   . Tiotropium Bromide-Olodaterol (STIOLTO RESPIMAT) 2.5-2.5 MCG/ACT AERS Inhale 2 puffs into the lungs daily.  . traMADol (ULTRAM) 50 MG tablet Take 50 mg by mouth every 6 (six) hours as needed.   No current facility-administered medications for this visit. (Other)  REVIEW OF SYSTEMS: ROS    Positive for: Cardiovascular, Eyes   Negative for: Constitutional, Gastrointestinal, Neurological, Skin, Genitourinary, Musculoskeletal, HENT, Endocrine, Respiratory, Psychiatric, Allergic/Imm, Heme/Lymph   Last edited by Matthew Folks, COA on 05/03/2020  2:31 PM. (History)       ALLERGIES Allergies  Allergen Reactions  . Amiodarone Other (See Comments)    alopecia  . Chantix [Varenicline]     Suicidal thoughts  . Codeine Swelling    Swelling as a young adult  . Lipitor [Atorvastatin] Diarrhea    And cramping.    PAST MEDICAL HISTORY Past Medical History:  Diagnosis Date  . Atrial fibrillation ([place])   . Factor V Leiden ([place])   . Hyperlipidemia   . Hypertension    . Hypertensive heart disease   . Hypertensive retinopathy    OU  . Hypothyroidism   . Macular degeneration    Dry OU  . Obesity (BMI 30-39.9)   . Thyroid disease    Past Surgical History:  Procedure Laterality Date  . ATRIAL FIBRILLATION ABLATION  12/2014   Dr Glennon Mac at [facility]  . ATRIAL FIBRILLATION ABLATION  08/2015   Dr Glennon Mac at [facility]  . CARDIOVERSION N/A 05/26/2014   Procedure: CARDIOVERSION;  Surgeon: Jacolyn Reedy, MD;  Location: [facility] ENDOSCOPY;  Service: Cardiovascular;  Laterality: N/A;  . CARDIOVERSION N/A 09/14/2014   Procedure: CARDIOVERSION;  Surgeon: Jacolyn Reedy, MD;  Location: [facility] ENDOSCOPY;  Service: Cardiovascular;  Laterality: N/A;  . CARDIOVERSION N/A 05/11/2015   Procedure: CARDIOVERSION;  Surgeon: Jacolyn Reedy, MD;  Location: [facility];  Service: Cardiovascular;  Laterality: N/A;  . CATARACT EXTRACTION Bilateral   . COSMETIC SURGERY     lower lids  . DENTAL SURGERY     wisdom teeth  . EYE SURGERY Bilateral    Cat Sx OU  . PACEMAKER IMPLANT N/A 02/26/2018   Procedure: PACEMAKER IMPLANT;  Surgeon: Evans Lance, MD;  Location: [facility];  Service: Cardiovascular;  Laterality: N/A;    FAMILY HISTORY Family History  Problem Relation Age of Onset  . Diabetes Brother     SOCIAL HISTORY Social History   Tobacco Use  . Smoking status: Former Smoker    Packs/day: 1.00    Years: 40.00    Pack years: 40.00    Quit date: 12/11/2013    Years since quitting: 6.4  . Smokeless tobacco: Never Used  Substance Use Topics  . Alcohol use: Yes    Alcohol/week: 8.0 standard drinks    Types: 8 Glasses of wine per week  . Drug use: No         OPHTHALMIC EXAM:  Base Eye Exam    Visual Acuity (Snellen - Linear)      Right Left   Dist [place] 20/100 -2 20/20   Dist ph [place] 20/25 -2        Tonometry (Tonopen, 2:37 PM)      Right Left   Pressure 15 15       Pupils      Dark Light Shape React APD   Right 3 2 Round Brisk None   Left 3 2  Round Brisk None       Visual Fields (Counting fingers)      Left Right    Full Full       Extraocular Movement      Right Left    Full, Ortho Full, Ortho       Neuro/Psych    Oriented x3:  Yes   Mood/Affect: Normal       Dilation    Both eyes: 1.0% Mydriacyl, 2.5% Phenylephrine @ 2:35 PM        Slit Lamp and Fundus Exam    Slit Lamp Exam      Right Left   Lids/Lashes Dermatochalasis - upper lid Dermatochalasis - upper lid   Conjunctiva/Sclera White and quiet White and quiet   Cornea Trace PEE, mild arcus, well healed temporal cataract wound, EBMD 1+ PEE, mild arcus, well healed temporal cataract wound   Anterior Chamber deep and clear deep and clear   Iris round and dilated round and dilated   Lens PC IOL in perfect position, trace PCO PC IOL in perfect position, open PC   Vitreous syneresis syneresis       Fundus Exam      Right Left   Disc Optic disc edema - improved, 360 flame hemes, CWS superiorly -- all resolved, focal fibrosis superior disc, hyperemia, vascular loops, focal PPP temporal compact, mild tilt, pink and sharp   C/D Ratio 0.0 0.4   Macula good foveal reflex, trace, persistent cystic changes, scattered IRH/DBH - improving, focal collection of flame hemes and CWS just inside IT arcades -- improved flat, good foveal reflex, mild RPE mottling, trace drusen, no heme or edema   Vessels Attenuated, Tortuous, Dilated, +CRVO attenuated, tortuous, mild A/V crossing changes   Periphery Attached, scattered IRH - improved, mild reticular degeneration attached, mild reticular degeneration          IMAGING AND PROCEDURES  Imaging and Procedures for _0 @  OCT, Retina - OU - Both Eyes       Right Eye Quality was good. Central Foveal Thickness: 263. Progression has improved. Findings include intraretinal fluid, normal foveal contour, no SRF (Mild interval improvement in cystic changes and disc edema).   Left Eye Quality was good. Central Foveal Thickness:  249. Progression has been stable. Findings include normal foveal contour, no IRF, no SRF, retinal drusen  (Trace drusen).   Notes *Images captured and stored on drive  Diagnosis / Impression:  OD: CRVO w/ CME: Mild interval improvement in cystic changes and disc edema OS: NFP, no IRF/SRF; Trace drusen  Clinical management:  See below  Abbreviations: NFP - Normal foveal profile. CME - cystoid macular edema. PED - pigment epithelial detachment. IRF - intraretinal fluid. SRF - subretinal fluid. EZ - ellipsoid zone. ERM - epiretinal membrane. ORA - outer retinal atrophy. ORT - outer retinal tubulation. SRHM - subretinal hyper-reflective material        Intravitreal Injection, Pharmacologic Agent - OD - Right Eye       Time Out 05/03/2020. 4:09 PM. Confirmed correct patient, procedure, site, and patient consented.   Anesthesia Topical anesthesia was used. Anesthetic medications included Lidocaine 2%, Proparacaine 0.5%.   Procedure Preparation included 5% betadine to ocular surface, eyelid speculum. A (32g) needle was used.   Injection:  1.25 mg Bevacizumab (AVASTIN) SOLN   NDC: 00459-977-41, Lot: [id number], Expiration date: 06/15/2020   Route: Intravitreal, Site: Right Eye, Waste: 0.05 mL  Post-op Post injection exam found visual acuity of at least counting fingers. The patient tolerated the procedure well. There were no complications. The patient received written and verbal post procedure care education. Post injection medications were not given.                 ASSESSMENT/PLAN:    ICD-10-CM   1. Central retinal vein occlusion with macular edema of right eye  H34.8110 Intravitreal Injection, Pharmacologic Agent - OD - Right Eye    Bevacizumab (AVASTIN) SOLN 1.25 mg  2. Retinal edema  H35.81 OCT, Retina - OU - Both Eyes  3. Factor V Leiden ([place])  D68.51   4. Essential hypertension  I10   5. Hypertensive retinopathy of both eyes  H35.033   6. Pseudophakia of both eyes   Z96.1     1-3. CRVO with macular edema OD        History of Factor V Leiden on Eliquis  - pt reports history of blood clots / DVTs  - significant CV history with A fib s/p ablation and pacemaker implantation             - s/p IVA OD # 1 (03.09.21), #2 (04.06.21), #3 (5.4.21), #4 (06.01.21), #5 (07.07.21), #6 (08.11.21)  - FA (04.06.21) shows delayed filling time, hyperfluorescence of the disc, mild focal leakage along IT arcades, mild focal leakage perifovea  - OCT today shows persistent cystic changes and disc edema - both slightly improved  - BCVA improved to 20/25 from 20/40 today  - recommend IVA OD #7 today, 09.15.2021  - pt wishes to proceed  - RBA of procedure discussed, questions answered  - informed consent obtained  - IVA consent form signed and scanned 03.09.21  - see procedure note  - return 5 weeks DFE, OCT, possible injection  4,5. Hypertensive retinopathy OU  - discussed importance of tight BP control  - monitor  6. Pseudophakia OU  - s/p CE/IOL OU w/ Dr. Valetta Close  - IOLs in good position   - monitor   Ophthalmic Meds Ordered this visit:  Meds ordered this encounter  Medications  . Bevacizumab (AVASTIN) SOLN 1.25 mg       Return in about 5 weeks (around 06/07/2020) for f/u CRVO OD, DFE, OCT.  There are no Patient Instructions on file for this visit.   Explained the diagnoses, plan, and follow up with the patient and they expressed understanding.  Patient expressed understanding of the importance of proper follow up care.  This document serves as a record of services personally performed by Gardiner Sleeper, MD, PhD. It was created on their behalf by Roselee Nova, COMT. The creation of this record is the provider's dictation and/or activities during the visit.  Electronically signed by: Roselee Nova, COMT 05/04/20 9:17 AM   This document serves as a record of services personally performed by Gardiner Sleeper, MD, PhD. It was created on their behalf by San Jetty. Owens Shark, OA an ophthalmic technician. The creation of this record is the provider's dictation and/or activities during the visit.    Electronically signed by: San Jetty. Owens Shark, [place] 09.15.2021 9:17 AM  Gardiner Sleeper, M.D., Ph.D. Diseases & Surgery of the Retina and Vitreous [facility]  I have reviewed the above documentation for accuracy and completeness, and I agree with the above. Gardiner Sleeper, M.D., Ph.D. 05/04/20 9:17 AM   Abbreviations: M myopia (nearsighted); A astigmatism; H hyperopia (farsighted); P presbyopia; Mrx spectacle prescription;  CTL contact lenses; OD right eye; OS left eye; OU both eyes  XT exotropia; ET esotropia; PEK punctate epithelial keratitis; PEE punctate epithelial erosions; DES dry eye syndrome; MGD meibomian gland dysfunction; ATs artificial tears; PFAT's preservative free artificial tears; [place] nuclear sclerotic cataract; PSC posterior subcapsular cataract; ERM epi-retinal membrane; PVD posterior vitreous detachment; RD retinal detachment; DM diabetes mellitus; DR diabetic retinopathy; NPDR non-proliferative diabetic retinopathy; PDR proliferative diabetic  retinopathy; CSME clinically significant macular edema; DME diabetic macular edema; dbh dot blot hemorrhages; CWS cotton wool spot; POAG primary open angle glaucoma; C/D cup-to-disc ratio; HVF humphrey visual field; GVF goldmann visual field; OCT optical coherence tomography; IOP intraocular pressure; BRVO Branch retinal vein occlusion; CRVO central retinal vein occlusion; CRAO central retinal artery occlusion; BRAO branch retinal artery occlusion; RT retinal tear; SB scleral buckle; PPV pars plana vitrectomy; VH Vitreous hemorrhage; PRP panretinal laser photocoagulation; IVK intravitreal kenalog; VMT vitreomacular traction; MH Macular hole;  NVD neovascularization of the disc; NVE neovascularization elsewhere; AREDS age related eye disease study; ARMD age related macular degeneration; POAG  primary open angle glaucoma; EBMD epithelial/anterior basement membrane dystrophy; ACIOL anterior chamber intraocular lens; IOL intraocular lens; PCIOL posterior chamber intraocular lens; Phaco/IOL phacoemulsification with intraocular lens placement; [place] photorefractive keratectomy; LASIK laser assisted in situ keratomileusis; HTN hypertension; DM diabetes mellitus; COPD chronic obstructive pulmonary disease

## 2020-05-03 ENCOUNTER — Ambulatory Visit (INDEPENDENT_AMBULATORY_CARE_PROVIDER_SITE_OTHER): Payer: Medicare HMO | Admitting: Ophthalmology

## 2020-05-03 ENCOUNTER — Encounter (INDEPENDENT_AMBULATORY_CARE_PROVIDER_SITE_OTHER): Payer: Self-pay | Admitting: Ophthalmology

## 2020-05-03 ENCOUNTER — Other Ambulatory Visit: Payer: Self-pay

## 2020-05-03 DIAGNOSIS — H35033 Hypertensive retinopathy, bilateral: Secondary | ICD-10-CM | POA: Diagnosis not present

## 2020-05-03 DIAGNOSIS — H3581 Retinal edema: Secondary | ICD-10-CM | POA: Diagnosis not present

## 2020-05-03 DIAGNOSIS — Z961 Presence of intraocular lens: Secondary | ICD-10-CM | POA: Diagnosis not present

## 2020-05-03 DIAGNOSIS — I10 Essential (primary) hypertension: Secondary | ICD-10-CM

## 2020-05-03 DIAGNOSIS — D6851 Activated protein C resistance: Secondary | ICD-10-CM | POA: Diagnosis not present

## 2020-05-03 DIAGNOSIS — H34811 Central retinal vein occlusion, right eye, with macular edema: Secondary | ICD-10-CM | POA: Diagnosis not present

## 2020-05-04 ENCOUNTER — Encounter (INDEPENDENT_AMBULATORY_CARE_PROVIDER_SITE_OTHER): Payer: Self-pay | Admitting: Ophthalmology

## 2020-05-04 DIAGNOSIS — I10 Essential (primary) hypertension: Secondary | ICD-10-CM | POA: Diagnosis not present

## 2020-05-04 DIAGNOSIS — D6851 Activated protein C resistance: Secondary | ICD-10-CM | POA: Diagnosis not present

## 2020-05-04 DIAGNOSIS — H34811 Central retinal vein occlusion, right eye, with macular edema: Secondary | ICD-10-CM

## 2020-05-04 DIAGNOSIS — Z961 Presence of intraocular lens: Secondary | ICD-10-CM | POA: Diagnosis not present

## 2020-05-04 DIAGNOSIS — H35033 Hypertensive retinopathy, bilateral: Secondary | ICD-10-CM | POA: Diagnosis not present

## 2020-05-04 DIAGNOSIS — H3581 Retinal edema: Secondary | ICD-10-CM | POA: Diagnosis not present

## 2020-05-04 MED ORDER — BEVACIZUMAB CHEMO INJECTION 1.25MG/0.05ML SYRINGE FOR KALEIDOSCOPE
1.2500 mg | INTRAVITREAL | Status: AC | PRN
  Administered 2020-05-04: 1.25 mg via INTRAVITREAL

## 2020-05-11 DIAGNOSIS — M25562 Pain in left knee: Secondary | ICD-10-CM | POA: Diagnosis not present

## 2020-05-11 DIAGNOSIS — M25571 Pain in right ankle and joints of right foot: Secondary | ICD-10-CM | POA: Diagnosis not present

## 2020-05-12 DIAGNOSIS — E039 Hypothyroidism, unspecified: Secondary | ICD-10-CM | POA: Diagnosis not present

## 2020-05-16 DIAGNOSIS — H26491 Other secondary cataract, right eye: Secondary | ICD-10-CM | POA: Diagnosis not present

## 2020-05-22 DIAGNOSIS — Z23 Encounter for immunization: Secondary | ICD-10-CM | POA: Diagnosis not present

## 2020-05-25 NOTE — Progress Notes (Signed)
Cardiology Office Note:    Date:  05/29/2020   ID:  Carly Romero, DOB [birth year], MRN [id number]  PCP:  Aretta Nip, MD  Cardiologist:  Izzak Fries [place], MD  Electrophysiologist:  [name], MD   Referring MD: Aretta Nip, MD   Chief Complaint  Patient presents with   Atrial Fibrillation    History of Present Illness:    Carly Romero is a 74 y.o. female with a hx of PAF s/p RFA in May 2016 and again in January 2017 at [facility], [place], hypertension, hyperlipidemia, DVTs, factor V Leiden, and hypothyroidism.  She has failed Tikosyn and has been intolerant of amiodarone.  She eventually underwent pacemaker implantation and was followed by Dr. Lovena Le.  She was evaluated by Dr. Lamonte Sakai and had a PFT that showed severe COPD.  She did appear to have significant bronchodilator response.  Patient developed atrial fibrillation in December 2019 and was initially seen in the A. fib clinic and converted to sinus rhythm.  Patient was seen by me in March 2020, she was seen persistent atrial fibrillation at the time.  To avoid bronchospasm, her metoprolol was decreased and she was started on Cardizem.    Last echocardiogram obtained on 02/01/2019 showed EF 55 to 60%, inferior basal hypokinesis, trivial AI.   She was seen in November by Almyra Deforest PA-C. She was having atypical chest pain at that time. A Lexiscan myoview was ordered but the patient later declined since her pain had resolved.   She was seen by Dr Lovena Le on 04/18/20. Noted to be in permanent Afib and pacemaker reprogrammed to VVI.   On follow up today she is doing well. Not really aware of her Afib. Has Cardionet and HR well controlled. No chest pain. She has lost some weight. She is planning to move to [facility] in the next year to have more support from family.    Past Medical History:  Diagnosis Date   Atrial fibrillation ([place])    Factor V Leiden ([place])    Hyperlipidemia    Hypertension    Hypertensive heart disease     Hypertensive retinopathy    OU   Hypothyroidism    Macular degeneration    Dry OU   Obesity (BMI 30-39.9)    Thyroid disease     Past Surgical History:  Procedure Laterality Date   ATRIAL FIBRILLATION ABLATION  12/2014   Dr Glennon Mac at [place]  08/2015   Dr Glennon Mac at [place] 05/26/2014   Procedure: CARDIOVERSION;  Surgeon: Jacolyn Reedy, MD;  Location: [facility];  Service: Cardiovascular;  Laterality: N/A;   CARDIOVERSION N/A 09/14/2014   Procedure: CARDIOVERSION;  Surgeon: Jacolyn Reedy, MD;  Location: [facility] ENDOSCOPY;  Service: Cardiovascular;  Laterality: N/A;   CARDIOVERSION N/A 05/11/2015   Procedure: CARDIOVERSION;  Surgeon: Jacolyn Reedy, MD;  Location: [facility];  Service: Cardiovascular;  Laterality: N/A;   CATARACT EXTRACTION Bilateral    COSMETIC SURGERY     lower lids   DENTAL SURGERY     wisdom teeth   EYE SURGERY Bilateral    Cat Sx OU   PACEMAKER IMPLANT N/A 02/26/2018   Procedure: PACEMAKER IMPLANT;  Surgeon: Evans Lance, MD;  Location: [place] CV LAB;  Service: Cardiovascular;  Laterality: N/A;    Current Medications: Current Meds  Medication Sig   albuterol (VENTOLIN HFA) 108 (90 Base) MCG/ACT inhaler Inhale 2 puffs into the lungs every 6 (six) hours  as needed for wheezing or shortness of breath.   BIOTIN PO Take 1 tablet by mouth daily.   Calcium Carbonate-Vitamin D (CALCIUM + D PO) Take 1 tablet by mouth daily.   diltiazem (CARDIZEM CD) 240 MG 24 hr capsule Take 1 capsule (240 mg total) by mouth daily.   ELIQUIS 5 MG TABS tablet Take 1 tablet (5 mg total) by mouth 2 (two) times daily.   fluorometholone (FML) 0.1 % ophthalmic suspension    FLUoxetine (PROZAC) 20 MG capsule Take 20 mg by mouth daily.   Fluoxetine HCl, PMDD, 20 MG TABS Take by mouth.   furosemide (LASIX) 40 MG tablet Take 1 tablet (40 mg total) by mouth daily as needed for fluid or edema.   levothyroxine  (SYNTHROID) 125 MCG tablet Take 125 mcg by mouth daily.   loratadine (CLARITIN) 10 MG tablet Take 10 mg by mouth daily as needed for allergies.    losartan (COZAAR) 50 MG tablet Take 1 tablet (50 mg total) by mouth daily.   metoprolol tartrate (LOPRESSOR) 50 MG tablet Take 0.5 tablets (25 mg total) by mouth 2 (two) times daily.   Multiple Vitamin (MULTIVITAMIN WITH MINERALS) TABS tablet Take 1 tablet by mouth daily.   Polyethyl Glycol-Propyl Glycol (SYSTANE OP) Place 1 drop into both eyes daily as needed (dry eyes).    potassium chloride (KLOR-CON) 10 MEQ tablet Take by mouth.    potassium chloride (KLOR-CON) 10 MEQ tablet Take 1 tablet (10 mEq total) by mouth daily. Please keep upcoming appt in July with Dr. Lovena Le before anymore refills. Thank you   rosuvastatin (CRESTOR) 40 MG tablet Take 40 mg by mouth daily.   SHINGRIX injection    Tiotropium Bromide-Olodaterol (STIOLTO RESPIMAT) 2.5-2.5 MCG/ACT AERS Inhale 2 puffs into the lungs daily.   [DISCONTINUED] diltiazem (CARDIZEM CD) 240 MG 24 hr capsule Take 1 capsule (240 mg total) by mouth daily.   [DISCONTINUED] ELIQUIS 5 MG TABS tablet Take 1 tablet (5 mg total) by mouth 2 (two) times daily.   [DISCONTINUED] furosemide (LASIX) 40 MG tablet Take 1 tablet (40 mg total) by mouth daily. (Patient taking differently: Take 40 mg by mouth daily. Pt taking as needed)   [DISCONTINUED] losartan (COZAAR) 50 MG tablet Take 1 tablet (50 mg total) by mouth daily.   [DISCONTINUED] metoprolol tartrate (LOPRESSOR) 50 MG tablet Take 1 tablet (50 mg total) by mouth 2 (two) times daily.   [DISCONTINUED] potassium chloride (KLOR-CON) 10 MEQ tablet Take 1 tablet (10 mEq total) by mouth daily. Please keep upcoming appt in July with Dr. Lovena Le before anymore refills. Thank you     Allergies:   Amiodarone, Chantix [varenicline], Codeine, and Lipitor [atorvastatin]   Social History   Socioeconomic History   Marital status: Single    Spouse name:  Not on file   Number of children: Not on file   Years of education: Not on file   Highest education level: Not on file  Occupational History   Not on file  Tobacco Use   Smoking status: Former Smoker    Packs/day: 1.00    Years: 40.00    Pack years: 40.00    Quit date: 12/11/2013    Years since quitting: 6.4   Smokeless tobacco: Never Used  Substance and Sexual Activity   Alcohol use: Yes    Alcohol/week: 8.0 standard drinks    Types: 8 Glasses of wine per week   Drug use: No   Sexual activity: Never  Other Topics Concern  Not on file  Social History Narrative   Not on file   Social Determinants of Health   Financial Resource Strain:    Difficulty of Paying Living Expenses: Not on file  Food Insecurity:    Worried About [place] in the Last Year: Not on file   Ran Out of Food in the Last Year: Not on file  Transportation Needs:    Lack of Transportation (Medical): Not on file   Lack of Transportation (Non-Medical): Not on file  Physical Activity:    Days of Exercise per Week: Not on file   Minutes of Exercise per Session: Not on file  Stress:    Feeling of Stress : Not on file  Social Connections:    Frequency of Communication with Friends and Family: Not on file   Frequency of Social Gatherings with Friends and Family: Not on file   Attends Religious Services: Not on file   Active Member of Clubs or Organizations: Not on file   Attends Archivist Meetings: Not on file   Marital Status: Not on file     Family History: The patient's family history includes Diabetes in her brother.  ROS:   Please see the history of present illness.    All other systems reviewed and are negative.  EKGs/Labs/Other Studies Reviewed:    The following studies were reviewed today:  Echo 02/01/2019 IMPRESSIONS  1. The left ventricle has normal systolic function, with an ejection fraction of 55-60%. The cavity size was normal. There is  severely increased left ventricular wall thickness. Left ventricular diastolic Doppler parameters are indeterminate.  2. Inferior basal hypokinesis but overall EF preserved.  3. The right ventricle has normal systolic function. The cavity was normal. There is no increase in right ventricular wall thickness.  4. Left atrial size was moderately dilated.  5. Moderate thickening of the mitral valve leaflet. Mild calcification of the mitral valve leaflet. There is moderate mitral annular calcification present.  6. The aortic valve was not well visualized. Moderate thickening of the aortic valve. Sclerosis without any evidence of stenosis of the aortic valve. Aortic valve regurgitation is trivial by color flow Doppler.  EKG:  EKG is not ordered today.   Recent Labs: No results found for requested labs within last 8760 hours.  Recent Lipid Panel    Component Value Date/Time   CHOL 208 (H) 01/14/2018 1009   TRIG 164 (H) 01/14/2018 1009   HDL 73 01/14/2018 1009   CHOLHDL 2.8 01/14/2018 1009   LDLCALC 102 (H) 01/14/2018 1009   Dated 10/27/19: cholesterol 181, triglycerides 205, HDL 52, LDL 94. Creatinine 1.15. CMET otherwise normal. CBC normal.  Physical Exam:    VS:  BP 136/88    Pulse 70    Ht 5\' 6"  (1.676 m)    Wt 207 lb 3.2 oz (94 kg)    SpO2 97%    BMI 33.44 kg/m     Wt Readings from Last 3 Encounters:  05/29/20 207 lb 3.2 oz (94 kg)  04/18/20 206 lb 3.2 oz (93.5 kg)  11/11/19 225 lb (102.1 kg)     GEN:  Well nourished, well developed in no acute distress HEENT: Normal NECK: No JVD; No carotid bruits LYMPHATICS: No lymphadenopathy CARDIAC: RRR, no murmurs, rubs, gallops RESPIRATORY:  Clear to auscultation without rales, wheezing or rhonchi  ABDOMEN: Soft, non-tender, non-distended MUSCULOSKELETAL:  No edema; No deformity  SKIN: Warm and dry NEUROLOGIC:  Alert and oriented x 3 PSYCHIATRIC:  Normal affect   ASSESSMENT:    1. Atrial fibrillation, chronic ([place])   2.  Tachycardia-bradycardia syndrome ([place])   3. Pacemaker   4. Essential hypertension   5. Factor V Leiden ([place])    PLAN:    In order of problems listed above:  1. Persistent atrial fibrillation: She has failed to ablation in the past. Managed with rate control strategy now  on combination of beta-blocker and the diltiazem.  Continue Eliquis. I would like to reduce metoprolol to 25 mg bid to see if we can still maintain rate control with less effect on bronchospasm.  2. Hypertension: Blood pressure is controlled  3. Hyperlipidemia: On Crestor  4. Factor V Leiden mutation: On Eliquis  5. Hypothyroidism: On Synthroid  6. COPD: No acute exacerbation.   Medication Adjustments/Labs and Tests Ordered: Current medicines are reviewed at length with the patient today.  Concerns regarding medicines are outlined above.  No orders of the defined types were placed in this encounter.  Meds ordered this encounter  Medications   metoprolol tartrate (LOPRESSOR) 50 MG tablet    Sig: Take 0.5 tablets (25 mg total) by mouth 2 (two) times daily.    Dispense:  90 tablet    Refill:  3   diltiazem (CARDIZEM CD) 240 MG 24 hr capsule    Sig: Take 1 capsule (240 mg total) by mouth daily.    Dispense:  90 capsule    Refill:  3   ELIQUIS 5 MG TABS tablet    Sig: Take 1 tablet (5 mg total) by mouth 2 (two) times daily.    Dispense:  180 tablet    Refill:  3   furosemide (LASIX) 40 MG tablet    Sig: Take 1 tablet (40 mg total) by mouth daily as needed for fluid or edema.    Dispense:  90 tablet    Refill:  3   losartan (COZAAR) 50 MG tablet    Sig: Take 1 tablet (50 mg total) by mouth daily.    Dispense:  30 tablet    Refill:  3   potassium chloride (KLOR-CON) 10 MEQ tablet    Sig: Take 1 tablet (10 mEq total) by mouth daily. Please keep upcoming appt in July with Dr. Lovena Le before anymore refills. Thank you    Dispense:  90 tablet    Refill:  3    Pt must keep upcoming appt in July with Dr.  Lovena Le before anymore refills. Final Attempt thank you    Patient Instructions  Reduce metoprolol to 25 mg twice a day     Follow up in 6 months  Signed, Keva Darty [place], MD  05/29/2020 12:04 PM    [place]

## 2020-05-29 ENCOUNTER — Ambulatory Visit: Payer: Medicare HMO | Admitting: Cardiology

## 2020-05-29 ENCOUNTER — Encounter: Payer: Self-pay | Admitting: Cardiology

## 2020-05-29 ENCOUNTER — Other Ambulatory Visit: Payer: Self-pay

## 2020-05-29 VITALS — BP 136/88 | HR 70 | Ht 66.0 in | Wt 207.2 lb

## 2020-05-29 DIAGNOSIS — I495 Sick sinus syndrome: Secondary | ICD-10-CM

## 2020-05-29 DIAGNOSIS — I482 Chronic atrial fibrillation, unspecified: Secondary | ICD-10-CM

## 2020-05-29 DIAGNOSIS — D6851 Activated protein C resistance: Secondary | ICD-10-CM | POA: Diagnosis not present

## 2020-05-29 DIAGNOSIS — Z950 Presence of cardiac pacemaker: Secondary | ICD-10-CM | POA: Diagnosis not present

## 2020-05-29 DIAGNOSIS — I10 Essential (primary) hypertension: Secondary | ICD-10-CM

## 2020-05-29 MED ORDER — METOPROLOL TARTRATE 50 MG PO TABS
25.0000 mg | ORAL_TABLET | Freq: Two times a day (BID) | ORAL | 3 refills | Status: DC
Start: 2020-05-29 — End: 2021-01-12

## 2020-05-29 MED ORDER — FUROSEMIDE 40 MG PO TABS: 40.0000 mg | ORAL_TABLET | Freq: Every day | ORAL | 3 refills | Status: DC | PRN

## 2020-05-29 MED ORDER — DILTIAZEM HCL ER COATED BEADS 240 MG PO CP24: 240.0000 mg | ORAL_CAPSULE | Freq: Every day | ORAL | 3 refills | Status: DC

## 2020-05-29 MED ORDER — LOSARTAN POTASSIUM 50 MG PO TABS
50.0000 mg | ORAL_TABLET | Freq: Every day | ORAL | 3 refills | Status: DC
Start: 2020-05-29 — End: 2020-12-12

## 2020-05-29 MED ORDER — ELIQUIS 5 MG PO TABS
5.0000 mg | ORAL_TABLET | Freq: Two times a day (BID) | ORAL | 3 refills | Status: DC
Start: 2020-05-29 — End: 2021-01-16

## 2020-05-29 MED ORDER — POTASSIUM CHLORIDE CRYS ER 10 MEQ PO TBCR
10.0000 meq | EXTENDED_RELEASE_TABLET | Freq: Every day | ORAL | 3 refills | Status: DC
Start: 2020-05-29 — End: 2021-01-16

## 2020-05-29 NOTE — Patient Instructions (Signed)
Reduce metoprolol to 25mg twice a day 

## 2020-06-07 ENCOUNTER — Other Ambulatory Visit: Payer: Self-pay

## 2020-06-07 ENCOUNTER — Ambulatory Visit (INDEPENDENT_AMBULATORY_CARE_PROVIDER_SITE_OTHER): Payer: Medicare HMO | Admitting: Ophthalmology

## 2020-06-07 ENCOUNTER — Encounter (INDEPENDENT_AMBULATORY_CARE_PROVIDER_SITE_OTHER): Payer: Self-pay | Admitting: Ophthalmology

## 2020-06-07 DIAGNOSIS — D6851 Activated protein C resistance: Secondary | ICD-10-CM | POA: Diagnosis not present

## 2020-06-07 DIAGNOSIS — Z961 Presence of intraocular lens: Secondary | ICD-10-CM | POA: Diagnosis not present

## 2020-06-07 DIAGNOSIS — H3581 Retinal edema: Secondary | ICD-10-CM

## 2020-06-07 DIAGNOSIS — H35033 Hypertensive retinopathy, bilateral: Secondary | ICD-10-CM

## 2020-06-07 DIAGNOSIS — I10 Essential (primary) hypertension: Secondary | ICD-10-CM

## 2020-06-07 DIAGNOSIS — H34811 Central retinal vein occlusion, right eye, with macular edema: Secondary | ICD-10-CM | POA: Diagnosis not present

## 2020-06-07 MED ORDER — BEVACIZUMAB CHEMO INJECTION 1.25MG/0.05ML SYRINGE FOR KALEIDOSCOPE
1.2500 mg | INTRAVITREAL | Status: AC | PRN
  Administered 2020-06-07: 1.25 mg via INTRAVITREAL

## 2020-06-07 NOTE — Progress Notes (Signed)
[facility] Note  06/07/2020     CHIEF COMPLAINT Patient presents for Retina Follow Up   HISTORY OF PRESENT ILLNESS: Carly Romero is a 74 y.o. female who presents to the clinic today for:   HPI    Retina Follow Up    Patient presents with  CRVO/BRVO.  In right eye.  Severity is moderate.  Duration of 5 weeks.  Since onset it is stable.  I, the attending physician,  performed the HPI with the patient and updated documentation appropriately.          Comments    Patient states vision the same OU.       Last edited by Bernarda Caffey, MD on 06/07/2020  3:37 PM. (History)    Pt states she feels like her right eye vision is down, she states her BP runs around 140/80  Referring physician: Aretta Nip, MD [place],  [place] [address]  HISTORICAL INFORMATION:   Selected notes from the MEDICAL RECORD NUMBER Referral from Dr. Kathlen Mody for eval of CRVO w/edema OD.     CURRENT MEDICATIONS: Current Outpatient Medications (Ophthalmic Drugs)  Medication Sig   Polyethyl Glycol-Propyl Glycol (SYSTANE OP) Place 1 drop into both eyes daily as needed (dry eyes).    fluorometholone (FML) 0.1 % ophthalmic suspension  (Patient not taking: Reported on 06/07/2020)   No current facility-administered medications for this visit. (Ophthalmic Drugs)   Current Outpatient Medications (Other)  Medication Sig   albuterol (VENTOLIN HFA) 108 (90 Base) MCG/ACT inhaler Inhale 2 puffs into the lungs every 6 (six) hours as needed for wheezing or shortness of breath.   BIOTIN PO Take 1 tablet by mouth daily.   Calcium Carbonate-Vitamin D (CALCIUM + D PO) Take 1 tablet by mouth daily.   diltiazem (CARDIZEM CD) 240 MG 24 hr capsule Take 1 capsule (240 mg total) by mouth daily.   ELIQUIS 5 MG TABS tablet Take 1 tablet (5 mg total) by mouth 2 (two) times daily.   FLUoxetine (PROZAC) 20 MG capsule Take 20 mg by mouth daily.   Fluoxetine HCl, PMDD, 20  MG TABS Take by mouth.   furosemide (LASIX) 40 MG tablet Take 1 tablet (40 mg total) by mouth daily as needed for fluid or edema.   levothyroxine (SYNTHROID) 125 MCG tablet Take 125 mcg by mouth daily.   loratadine (CLARITIN) 10 MG tablet Take 10 mg by mouth daily as needed for allergies.    losartan (COZAAR) 50 MG tablet Take 1 tablet (50 mg total) by mouth daily.   metoprolol tartrate (LOPRESSOR) 50 MG tablet Take 0.5 tablets (25 mg total) by mouth 2 (two) times daily.   Multiple Vitamin (MULTIVITAMIN WITH MINERALS) TABS tablet Take 1 tablet by mouth daily.   potassium chloride (KLOR-CON) 10 MEQ tablet Take by mouth.    potassium chloride (KLOR-CON) 10 MEQ tablet Take 1 tablet (10 mEq total) by mouth daily. Please keep upcoming appt in July with Dr. Lovena Le before anymore refills. Thank you   rosuvastatin (CRESTOR) 40 MG tablet Take 40 mg by mouth daily.   SHINGRIX injection    Tiotropium Bromide-Olodaterol (STIOLTO RESPIMAT) 2.5-2.5 MCG/ACT AERS Inhale 2 puffs into the lungs daily.   No current facility-administered medications for this visit. (Other)      REVIEW OF SYSTEMS: ROS    Positive for: Cardiovascular, Eyes, Respiratory   Negative for: Constitutional, Gastrointestinal, Neurological, Skin, Genitourinary, Musculoskeletal, HENT, Endocrine, Psychiatric, Allergic/Imm, Heme/Lymph   Last  edited by Roselee Nova D, COT on 06/07/2020  3:11 PM. (History)       ALLERGIES Allergies  Allergen Reactions   Amiodarone Other (See Comments)    alopecia   Chantix [Varenicline]     Suicidal thoughts   Codeine Swelling    Swelling as a young adult   Lipitor [Atorvastatin] Diarrhea    And cramping.    PAST MEDICAL HISTORY Past Medical History:  Diagnosis Date   Atrial fibrillation ([place])    Factor V Leiden ([place])    Hyperlipidemia    Hypertension    Hypertensive heart disease    Hypertensive retinopathy    OU   Hypothyroidism    Macular degeneration     Dry OU   Obesity (BMI 30-39.9)    Thyroid disease    Past Surgical History:  Procedure Laterality Date   ATRIAL FIBRILLATION ABLATION  12/2014   Dr Glennon Mac at [facility]  08/2015   Dr Glennon Mac at [facility] 05/26/2014   Procedure: CARDIOVERSION;  Surgeon: Jacolyn Reedy, MD;  Location: [facility];  Service: Cardiovascular;  Laterality: N/A;   CARDIOVERSION N/A 09/14/2014   Procedure: CARDIOVERSION;  Surgeon: Jacolyn Reedy, MD;  Location: [facility] ENDOSCOPY;  Service: Cardiovascular;  Laterality: N/A;   CARDIOVERSION N/A 05/11/2015   Procedure: CARDIOVERSION;  Surgeon: Jacolyn Reedy, MD;  Location: [facility];  Service: Cardiovascular;  Laterality: N/A;   CATARACT EXTRACTION Bilateral    COSMETIC SURGERY     lower lids   DENTAL SURGERY     wisdom teeth   EYE SURGERY Bilateral    Cat Sx OU   PACEMAKER IMPLANT N/A 02/26/2018   Procedure: PACEMAKER IMPLANT;  Surgeon: Evans Lance, MD;  Location: [facility];  Service: Cardiovascular;  Laterality: N/A;    FAMILY HISTORY Family History  Problem Relation Age of Onset   Diabetes Brother     SOCIAL HISTORY Social History   Tobacco Use   Smoking status: Former Smoker    Packs/day: 1.00    Years: 40.00    Pack years: 40.00    Quit date: 12/11/2013    Years since quitting: 6.4   Smokeless tobacco: Never Used  Substance Use Topics   Alcohol use: Yes    Alcohol/week: 8.0 standard drinks    Types: 8 Glasses of wine per week   Drug use: No         OPHTHALMIC EXAM:  Base Eye Exam    Visual Acuity (Snellen - Linear)      Right Left   Dist cc 20/50 +2 20/20 -2   Dist ph cc 20/40 -2 NI   Correction: Glasses       Tonometry (Tonopen, 3:24 PM)      Right Left   Pressure 12 13       Pupils      Dark Light Shape React APD   Right 3 2 Round Brisk None   Left 3 2 Round Brisk None       Visual Fields (Counting fingers)      Left Right    Full Full        Extraocular Movement      Right Left    Full, Ortho Full, Ortho       Neuro/Psych    Oriented x3: Yes   Mood/Affect: Normal       Dilation    Both eyes: 1.0% Mydriacyl, 2.5% Phenylephrine @ 3:24 PM  Slit Lamp and Fundus Exam    Slit Lamp Exam      Right Left   Lids/Lashes Dermatochalasis - upper lid Dermatochalasis - upper lid   Conjunctiva/Sclera White and quiet White and quiet   Cornea Trace PEE, mild arcus, well healed temporal cataract wound, EBMD 1+ PEE, mild arcus, well healed temporal cataract wound   Anterior Chamber deep and clear deep and clear   Iris round and dilated round and dilated   Lens PC IOL in perfect position, open PC PC IOL in perfect position, open PC   Vitreous syneresis syneresis       Fundus Exam      Right Left   Disc Optic disc edema, hyperemia, vascular loops, focal PPP temporal compact, mild tilt, pink and sharp   C/D Ratio 0.2 0.4   Macula blunted foveal reflex, central IRF/CME, no heme flat, good foveal reflex, mild RPE mottling and clumping, trace drusen, no heme or edema   Vessels Attenuated, Tortuous, +CRVO, AV crossing changes attenuated, tortuous, mild A/V crossing changes   Periphery Attached, scattered IRH - improved, mild reticular degeneration attached, mild reticular degeneration        Refraction    Wearing Rx      Sphere Cylinder Add   Right -0.75 Sphere +2.50   Left -0.50 Sphere +2.50   Type: PAL       Manifest Refraction      Sphere Cylinder Axis Dist VA   Right -1.00 +1.00 030 20/40-2   Left              IMAGING AND PROCEDURES  Imaging and Procedures for @TODAY @  OCT, Retina - OU - Both Eyes       Right Eye Quality was good. Central Foveal Thickness: 540. Progression has worsened. Findings include intraretinal fluid, abnormal foveal contour, subretinal fluid (Interval re-development of central IRF/CME).   Left Eye Quality was good. Central Foveal Thickness: 250. Progression has been stable.  Findings include normal foveal contour, no IRF, no SRF, retinal drusen  (Trace drusen).   Notes *Images captured and stored on drive  Diagnosis / Impression:  OD: CRVO w/ CME: Interval re-development of central IRF/CME OS: NFP, no IRF/SRF; Trace drusen  Clinical management:  See below  Abbreviations: NFP - Normal foveal profile. CME - cystoid macular edema. PED - pigment epithelial detachment. IRF - intraretinal fluid. SRF - subretinal fluid. EZ - ellipsoid zone. ERM - epiretinal membrane. ORA - outer retinal atrophy. ORT - outer retinal tubulation. SRHM - subretinal hyper-reflective material        Intravitreal Injection, Pharmacologic Agent - OD - Right Eye       Time Out 06/07/2020. 3:42 PM. Confirmed correct patient, procedure, site, and patient consented.   Anesthesia Topical anesthesia was used. Anesthetic medications included Lidocaine 2%, Proparacaine 0.5%.   Procedure Preparation included 5% betadine to ocular surface, eyelid speculum. A (32g) needle was used.   Injection:  1.25 mg Bevacizumab (AVASTIN) SOLN   NDC: 08657-846-96, Lot: [id number], Expiration date: 08/04/2020   Route: Intravitreal, Site: Right Eye, Waste: 0.05 mL  Post-op Post injection exam found visual acuity of at least counting fingers. The patient tolerated the procedure well. There were no complications. The patient received written and verbal post procedure care education. Post injection medications were not given.                 ASSESSMENT/PLAN:    ICD-10-CM   1. Central retinal vein occlusion with macular edema of  right eye  H34.8110 Intravitreal Injection, Pharmacologic Agent - OD - Right Eye    Bevacizumab (AVASTIN) SOLN 1.25 mg  2. Retinal edema  H35.81 OCT, Retina - OU - Both Eyes  3. Factor V Leiden ([place])  D68.51   4. Essential hypertension  I10   5. Hypertensive retinopathy of both eyes  H35.033   6. Pseudophakia of both eyes  Z96.1     1-3. CRVO with macular edema OD         History of Factor V Leiden on Eliquis  - pt reports history of blood clots / DVTs  - significant CV history with A fib s/p ablation and pacemaker implantation             - s/p IVA OD # 1 (03.09.21), #2 (04.06.21), #3 (5.4.21), #4 (06.01.21), #5 (07.07.21), #6 (08.11.21), #7 (09.15.2021)  - FA (04.06.21) shows delayed filling time, hyperfluorescence of the disc, mild focal leakage along IT arcades, mild focal leakage perifovea  - OCT today shows interval re-development of central IRF/CME at 5 wk interval  - BCVA decreased to 20/40 from 20/25 today  - recommend IVA OD #8 today, 10.20.2021, w/ f/u back to 4 wks  - pt wishes to proceed  - RBA of procedure discussed, questions answered  - informed consent obtained  - IVA consent form signed and scanned 03.09.21  - see procedure note  - return 4 weeks DFE, OCT, possible injection  4,5. Hypertensive retinopathy OU  - discussed importance of tight BP control  - monitor  6. Pseudophakia OU  - s/p CE/IOL OU w/ Dr. Valetta Close  - IOLs in good position   - monitor   Ophthalmic Meds Ordered this visit:  Meds ordered this encounter  Medications   Bevacizumab (AVASTIN) SOLN 1.25 mg       Return in about 4 weeks (around 07/05/2020) for f/u CRVO OD, DFE, OCT.  There are no Patient Instructions on file for this visit.   Explained the diagnoses, plan, and follow up with the patient and they expressed understanding.  Patient expressed understanding of the importance of proper follow up care.  This document serves as a record of services personally performed by Gardiner Sleeper, MD, PhD. It was created on their behalf by Roselee Nova, COMT. The creation of this record is the provider's dictation and/or activities during the visit.  Electronically signed by: Roselee Nova, COMT 06/07/20 4:22 PM   This document serves as a record of services personally performed by Gardiner Sleeper, MD, PhD. It was created on their behalf by San Jetty. Owens Shark, OA an  ophthalmic technician. The creation of this record is the provider's dictation and/or activities during the visit.    Electronically signed by: San Jetty. [place], [place] 10.20.2021 4:22 PM  Gardiner Sleeper, M.D., Ph.D. Diseases & Surgery of the Retina and Vitreous [facility]  I have reviewed the above documentation for accuracy and completeness, and I agree with the above. Gardiner Sleeper, M.D., Ph.D. 06/07/20 4:22 PM    Abbreviations: M myopia (nearsighted); A astigmatism; H hyperopia (farsighted); P presbyopia; Mrx spectacle prescription;  CTL contact lenses; OD right eye; OS left eye; OU both eyes  XT exotropia; ET esotropia; PEK punctate epithelial keratitis; PEE punctate epithelial erosions; DES dry eye syndrome; MGD meibomian gland dysfunction; ATs artificial tears; PFAT's preservative free artificial tears; [place] nuclear sclerotic cataract; PSC posterior subcapsular cataract; ERM epi-retinal membrane; PVD posterior vitreous detachment; RD retinal detachment; DM diabetes  mellitus; DR diabetic retinopathy; NPDR non-proliferative diabetic retinopathy; PDR proliferative diabetic retinopathy; CSME clinically significant macular edema; DME diabetic macular edema; dbh dot blot hemorrhages; CWS cotton wool spot; POAG primary open angle glaucoma; C/D cup-to-disc ratio; HVF humphrey visual field; GVF goldmann visual field; OCT optical coherence tomography; IOP intraocular pressure; BRVO Branch retinal vein occlusion; CRVO central retinal vein occlusion; CRAO central retinal artery occlusion; BRAO branch retinal artery occlusion; RT retinal tear; SB scleral buckle; PPV pars plana vitrectomy; VH Vitreous hemorrhage; PRP panretinal laser photocoagulation; IVK intravitreal kenalog; VMT vitreomacular traction; MH Macular hole;  NVD neovascularization of the disc; NVE neovascularization elsewhere; AREDS age related eye disease study; ARMD age related macular degeneration; POAG primary open angle  glaucoma; EBMD epithelial/anterior basement membrane dystrophy; ACIOL anterior chamber intraocular lens; IOL intraocular lens; PCIOL posterior chamber intraocular lens; Phaco/IOL phacoemulsification with intraocular lens placement; [place] photorefractive keratectomy; LASIK laser assisted in situ keratomileusis; HTN hypertension; DM diabetes mellitus; COPD chronic obstructive pulmonary disease

## 2020-06-26 ENCOUNTER — Ambulatory Visit (INDEPENDENT_AMBULATORY_CARE_PROVIDER_SITE_OTHER): Payer: Medicare HMO

## 2020-06-26 DIAGNOSIS — I495 Sick sinus syndrome: Secondary | ICD-10-CM

## 2020-06-26 LAB — CUP PACEART REMOTE DEVICE CHECK
Battery Remaining Longevity: 133 mo
Battery Remaining Percentage: 95.5 %
Battery Voltage: 3.01 V
Brady Statistic RV Percent Paced: 29 %
Date Time Interrogation Session: 20211108010013
Implantable Lead Implant Date: 20190711
Implantable Lead Implant Date: 20190711
Implantable Lead Location: 753859
Implantable Lead Location: 753860
Implantable Pulse Generator Implant Date: 20190711
Lead Channel Impedance Value: 560 Ohm
Lead Channel Pacing Threshold Amplitude: 1 V
Lead Channel Pacing Threshold Pulse Width: 0.4 ms
Lead Channel Sensing Intrinsic Amplitude: 12 mV
Lead Channel Setting Pacing Amplitude: 2.5 V
Lead Channel Setting Pacing Pulse Width: 0.4 ms
Lead Channel Setting Sensing Sensitivity: 2 mV
Pulse Gen Model: 2272
Pulse Gen Serial Number: 9041042

## 2020-06-27 NOTE — Progress Notes (Signed)
Remote pacemaker transmission.   

## 2020-07-04 NOTE — Progress Notes (Signed)
[facility] Note  07/05/2020     CHIEF COMPLAINT Patient presents for Retina Follow Up   HISTORY OF PRESENT ILLNESS: Carly Romero is a 74 y.o. female who presents to the clinic today for:   HPI    Retina Follow Up    Patient presents with  CRVO/BRVO.  In right eye.  This started weeks ago.  Severity is moderate.  Duration of weeks.  Since onset it is stable.  I, the attending physician,  performed the HPI with the patient and updated documentation appropriately.          Comments    Pt states vision is the same OU.  Pt denies eye pain or discomfort and denies any new or worsening floaters or fol OU.       Last edited by Bernarda Caffey, MD on 07/05/2020  4:29 PM. (History)    BP has been holding steady around 130/80.  Referring physician: Aretta Nip, MD [place],  [place] [address]  HISTORICAL INFORMATION:   Selected notes from the MEDICAL RECORD NUMBER Referral from Dr. Kathlen Mody for eval of CRVO w/edema OD.   CURRENT MEDICATIONS: Current Outpatient Medications (Ophthalmic Drugs)  Medication Sig  . fluorometholone (FML) 0.1 % ophthalmic suspension  (Patient not taking: Reported on 06/07/2020)  . Polyethyl Glycol-Propyl Glycol (SYSTANE OP) Place 1 drop into both eyes daily as needed (dry eyes).    No current facility-administered medications for this visit. (Ophthalmic Drugs)   Current Outpatient Medications (Other)  Medication Sig  . albuterol (VENTOLIN HFA) 108 (90 Base) MCG/ACT inhaler Inhale 2 puffs into the lungs every 6 (six) hours as needed for wheezing or shortness of breath.  Marland Kitchen BIOTIN PO Take 1 tablet by mouth daily.  . Calcium Carbonate-Vitamin D (CALCIUM + D PO) Take 1 tablet by mouth daily.  Marland Kitchen diltiazem (CARDIZEM CD) 240 MG 24 hr capsule Take 1 capsule (240 mg total) by mouth daily.  Marland Kitchen ELIQUIS 5 MG TABS tablet Take 1 tablet (5 mg total) by mouth 2 (two) times daily.  Marland Kitchen FLUoxetine (PROZAC) 20 MG capsule  Take 20 mg by mouth daily.  . Fluoxetine HCl, PMDD, 20 MG TABS Take by mouth.  . furosemide (LASIX) 40 MG tablet Take 1 tablet (40 mg total) by mouth daily as needed for fluid or edema.  Marland Kitchen levothyroxine (SYNTHROID) 125 MCG tablet Take 125 mcg by mouth daily.  Marland Kitchen loratadine (CLARITIN) 10 MG tablet Take 10 mg by mouth daily as needed for allergies.   Marland Kitchen losartan (COZAAR) 50 MG tablet Take 1 tablet (50 mg total) by mouth daily.  . metoprolol tartrate (LOPRESSOR) 50 MG tablet Take 0.5 tablets (25 mg total) by mouth 2 (two) times daily.  . Multiple Vitamin (MULTIVITAMIN WITH MINERALS) TABS tablet Take 1 tablet by mouth daily.  . potassium chloride (KLOR-CON) 10 MEQ tablet Take by mouth.   . potassium chloride (KLOR-CON) 10 MEQ tablet Take 1 tablet (10 mEq total) by mouth daily. Please keep upcoming appt in July with Dr. Lovena Le before anymore refills. Thank you  . rosuvastatin (CRESTOR) 40 MG tablet Take 40 mg by mouth daily.  Marland Kitchen SHINGRIX injection   . Tiotropium Bromide-Olodaterol (STIOLTO RESPIMAT) 2.5-2.5 MCG/ACT AERS Inhale 2 puffs into the lungs daily.   No current facility-administered medications for this visit. (Other)      REVIEW OF SYSTEMS: ROS    Positive for: Cardiovascular, Eyes, Respiratory   Negative for: Constitutional, Gastrointestinal, Neurological, Skin, Genitourinary,  Musculoskeletal, HENT, Endocrine, Psychiatric, Allergic/Imm, Heme/Lymph   Last edited by Doneen Poisson on 07/05/2020  2:53 PM. (History)       ALLERGIES Allergies  Allergen Reactions  . Amiodarone Other (See Comments)    alopecia  . Chantix [Varenicline]     Suicidal thoughts  . Codeine Swelling    Swelling as a young adult  . Lipitor [Atorvastatin] Diarrhea    And cramping.    PAST MEDICAL HISTORY Past Medical History:  Diagnosis Date  . Atrial fibrillation ([place])   . Factor V Leiden ([place])   . Hyperlipidemia   . Hypertension   . Hypertensive heart disease   . Hypertensive retinopathy     OU  . Hypothyroidism   . Macular degeneration    Dry OU  . Obesity (BMI 30-39.9)   . Thyroid disease    Past Surgical History:  Procedure Laterality Date  . ATRIAL FIBRILLATION ABLATION  12/2014   Dr Glennon Mac at [facility]  . ATRIAL FIBRILLATION ABLATION  08/2015   Dr Glennon Mac at [facility]  . CARDIOVERSION N/A 05/26/2014   Procedure: CARDIOVERSION;  Surgeon: Jacolyn Reedy, MD;  Location: [facility] ENDOSCOPY;  Service: Cardiovascular;  Laterality: N/A;  . CARDIOVERSION N/A 09/14/2014   Procedure: CARDIOVERSION;  Surgeon: Jacolyn Reedy, MD;  Location: [facility] ENDOSCOPY;  Service: Cardiovascular;  Laterality: N/A;  . CARDIOVERSION N/A 05/11/2015   Procedure: CARDIOVERSION;  Surgeon: Jacolyn Reedy, MD;  Location: [facility];  Service: Cardiovascular;  Laterality: N/A;  . CATARACT EXTRACTION Bilateral   . COSMETIC SURGERY     lower lids  . DENTAL SURGERY     wisdom teeth  . EYE SURGERY Bilateral    Cat Sx OU  . PACEMAKER IMPLANT N/A 02/26/2018   Procedure: PACEMAKER IMPLANT;  Surgeon: Evans Lance, MD;  Location: [place] [facility];  Service: Cardiovascular;  Laterality: N/A;    FAMILY HISTORY Family History  Problem Relation Age of Onset  . Diabetes Brother     SOCIAL HISTORY Social History   Tobacco Use  . Smoking status: Former Smoker    Packs/day: 1.00    Years: 40.00    Pack years: 40.00    Quit date: 12/11/2013    Years since quitting: 6.5  . Smokeless tobacco: Never Used  Substance Use Topics  . Alcohol use: Yes    Alcohol/week: 8.0 standard drinks    Types: 8 Glasses of wine per week  . Drug use: No         OPHTHALMIC EXAM:  Base Eye Exam    Visual Acuity (Snellen - Linear)      Right Left   Dist cc 20/30 -2 20/20 -2   Correction: Glasses       Tonometry (Tonopen, 2:57 PM)      Right Left   Pressure 10 10       Pupils      Dark Light Shape React APD   Right 5 4 Round Brisk 0   Left 5 4 Round Brisk 0       Visual Fields      Left Right    Full Full        Extraocular Movement      Right Left    Full Full       Neuro/Psych    Oriented x3: Yes   Mood/Affect: Normal       Dilation    Both eyes: 1.0% Mydriacyl, 2.5% Phenylephrine @ 2:57 PM  Slit Lamp and Fundus Exam    Slit Lamp Exam      Right Left   Lids/Lashes Dermatochalasis - upper lid Dermatochalasis - upper lid   Conjunctiva/Sclera White and quiet White and quiet   Cornea Trace PEE, mild arcus, well healed temporal cataract wound, EBMD 1+ PEE, mild arcus, well healed temporal cataract wound   Anterior Chamber deep and clear deep and clear   Iris round and dilated round and dilated   Lens PC IOL in perfect position, open PC PC IOL in perfect position, open PC   Vitreous syneresis syneresis       Fundus Exam      Right Left   Disc Optic disc edema, hyperemia, vascular loops, focal PPP temporal compact, mild tilt, pink and sharp   C/D Ratio 0.2 0.4   Macula Improved foveal reflex, central IRF/CME improved, no heme flat, good foveal reflex, mild RPE mottling and clumping, trace drusen, no heme or edema   Vessels Attenuated, Tortuous, +CRVO, AV crossing changes attenuated, tortuous, mild A/V crossing changes   Periphery Attached, scattered IRH - improved, mild reticular degeneration attached, mild reticular degeneration        Refraction    Wearing Rx      Sphere Cylinder Add   Right -0.75 Sphere +2.50   Left -0.50 Sphere +2.50   Type: PAL          IMAGING AND PROCEDURES  Imaging and Procedures for @TODAY @  OCT, Retina - OU - Both Eyes       Right Eye Quality was good. Central Foveal Thickness: 260. Progression has improved. Findings include intraretinal fluid, normal foveal contour, no SRF (Interval improvement in IRF/CME.  Trace cystic changes remain.).   Left Eye Quality was good. Central Foveal Thickness: 247. Progression has been stable. Findings include normal foveal contour, no IRF, no SRF, retinal drusen  (Trace drusen).   Notes *Images  captured and stored on drive  Diagnosis / Impression:  OD: Interval improvement in IRF/CME.  Trace cystic changes remain. OS: NFP, no IRF/SRF; Trace drusen  Clinical management:  See below  Abbreviations: NFP - Normal foveal profile. CME - cystoid macular edema. PED - pigment epithelial detachment. IRF - intraretinal fluid. SRF - subretinal fluid. EZ - ellipsoid zone. ERM - epiretinal membrane. ORA - outer retinal atrophy. ORT - outer retinal tubulation. SRHM - subretinal hyper-reflective material        Intravitreal Injection, Pharmacologic Agent - OD - Right Eye       Time Out 07/05/2020. 3:50 PM. Confirmed correct patient, procedure, site, and patient consented.   Anesthesia Topical anesthesia was used. Anesthetic medications included Lidocaine 2%, Proparacaine 0.5%.   Procedure Preparation included 5% betadine to ocular surface, eyelid speculum. A supplied (32g) needle was used.   Injection:  1.25 mg Bevacizumab (AVASTIN) SOLN   NDC: 81191-478-29, Lot: [id number] , Expiration date: 08/15/2020   Route: Intravitreal, Site: Right Eye, Waste: 0 mg  Post-op Post injection exam found visual acuity of at least counting fingers. The patient tolerated the procedure well. There were no complications. The patient received written and verbal post procedure care education. Post injection medications were not given.                 ASSESSMENT/PLAN:    ICD-10-CM   1. Central retinal vein occlusion with macular edema of right eye  H34.8110 Intravitreal Injection, Pharmacologic Agent - OD - Right Eye    Bevacizumab (AVASTIN) SOLN 1.25 mg  2. Retinal edema  H35.81 OCT, Retina - OU - Both Eyes  3. Factor V Leiden ([place])  D68.51   4. Essential hypertension  I10   5. Hypertensive retinopathy of both eyes  H35.033   6. Pseudophakia of both eyes  Z96.1     1-3. CRVO with macular edema OD        History of Factor V Leiden on Eliquis  - pt reports history of blood clots / DVTs  -  significant CV history with A fib s/p ablation and pacemaker implantation             - s/p IVA OD # 1 (03.09.21), #2 (04.06.21), #3 (5.4.21), #4 (06.01.21), #5 (07.07.21), #6 (08.11.21), #7 (09.15.2021), #8 (10.20.21)  **history of recurrent CME at 5 wk interval (10.20.21)**  - FA (04.06.21) shows delayed filling time, hyperfluorescence of the disc, mild focal leakage along IT arcades, mild focal leakage perifovea  - OCT today shows interval improvement in IRF/CME.  Trace cystic changes remain OD  - BCVA improved to 20/30 from 20/40 today (interval reduced back to 4 wks)  - recommend IVA OD #9 today, 11.17.2021, w/ f/u in 4 wks  - pt wishes to proceed  - RBA of procedure discussed, questions answered  - informed consent obtained  - IVA consent form signed and scanned 03.09.21  - see procedure note  - return 4 weeks DFE, OCT, possible injection  4,5. Hypertensive retinopathy OU  - discussed importance of tight BP control  - monitor  6. Pseudophakia OU  - s/p CE/IOL OU w/ Dr. Valetta Close  - IOLs in good position   - monitor  Ophthalmic Meds Ordered this visit:  Meds ordered this encounter  Medications  . Bevacizumab (AVASTIN) SOLN 1.25 mg       Return in about 4 weeks (around 08/02/2020) for 4 wk f/u for CRVO OD w/DFE&OCT.  There are no Patient Instructions on file for this visit.  This document serves as a record of services personally performed by Gardiner Sleeper, MD, PhD. It was created on their behalf by Leeann Must, [place], an ophthalmic technician. The creation of this record is the provider's dictation and/or activities during the visit.    Electronically signed by: Leeann Must, COA @TODAY @ 4:33 PM   This document serves as a record of services personally performed by Gardiner Sleeper, MD, PhD. It was created on their behalf by Estill Bakes, COT an ophthalmic technician. The creation of this record is the provider's dictation and/or activities during the visit.     Electronically signed by: Estill Bakes, [place] 11.17.21 @ 4:33 PM  Gardiner Sleeper, M.D., Ph.D. Diseases & Surgery of the Retina and [place] 07/05/2020   I have reviewed the above documentation for accuracy and completeness, and I agree with the above. Gardiner Sleeper, M.D., Ph.D. 07/05/20 4:33 PM    Abbreviations: M myopia (nearsighted); A astigmatism; H hyperopia (farsighted); P presbyopia; Mrx spectacle prescription;  CTL contact lenses; OD right eye; OS left eye; OU both eyes  XT exotropia; ET esotropia; PEK punctate epithelial keratitis; PEE punctate epithelial erosions; DES dry eye syndrome; MGD meibomian gland dysfunction; ATs artificial tears; PFAT's preservative free artificial tears; [place] nuclear sclerotic cataract; PSC posterior subcapsular cataract; ERM epi-retinal membrane; PVD posterior vitreous detachment; RD retinal detachment; DM diabetes mellitus; DR diabetic retinopathy; NPDR non-proliferative diabetic retinopathy; PDR proliferative diabetic retinopathy; CSME clinically significant macular edema; DME diabetic macular edema; dbh dot blot hemorrhages; CWS cotton wool spot;  POAG primary open angle glaucoma; C/D cup-to-disc ratio; HVF humphrey visual field; GVF goldmann visual field; OCT optical coherence tomography; IOP intraocular pressure; BRVO Branch retinal vein occlusion; CRVO central retinal vein occlusion; CRAO central retinal artery occlusion; BRAO branch retinal artery occlusion; RT retinal tear; SB scleral buckle; PPV pars plana vitrectomy; VH Vitreous hemorrhage; PRP panretinal laser photocoagulation; IVK intravitreal kenalog; VMT vitreomacular traction; MH Macular hole;  NVD neovascularization of the disc; NVE neovascularization elsewhere; AREDS age related eye disease study; ARMD age related macular degeneration; POAG primary open angle glaucoma; EBMD epithelial/anterior basement membrane dystrophy; ACIOL anterior chamber intraocular lens;  IOL intraocular lens; PCIOL posterior chamber intraocular lens; Phaco/IOL phacoemulsification with intraocular lens placement; [place] photorefractive keratectomy; LASIK laser assisted in situ keratomileusis; HTN hypertension; DM diabetes mellitus; COPD chronic obstructive pulmonary disease.

## 2020-07-05 ENCOUNTER — Other Ambulatory Visit: Payer: Self-pay

## 2020-07-05 ENCOUNTER — Ambulatory Visit (INDEPENDENT_AMBULATORY_CARE_PROVIDER_SITE_OTHER): Payer: Medicare HMO | Admitting: Ophthalmology

## 2020-07-05 ENCOUNTER — Encounter (INDEPENDENT_AMBULATORY_CARE_PROVIDER_SITE_OTHER): Payer: Self-pay | Admitting: Ophthalmology

## 2020-07-05 DIAGNOSIS — H34811 Central retinal vein occlusion, right eye, with macular edema: Secondary | ICD-10-CM

## 2020-07-05 DIAGNOSIS — Z961 Presence of intraocular lens: Secondary | ICD-10-CM

## 2020-07-05 DIAGNOSIS — H35033 Hypertensive retinopathy, bilateral: Secondary | ICD-10-CM

## 2020-07-05 DIAGNOSIS — D6851 Activated protein C resistance: Secondary | ICD-10-CM

## 2020-07-05 DIAGNOSIS — I10 Essential (primary) hypertension: Secondary | ICD-10-CM

## 2020-07-05 DIAGNOSIS — H3581 Retinal edema: Secondary | ICD-10-CM

## 2020-07-05 MED ORDER — BEVACIZUMAB CHEMO INJECTION 1.25MG/0.05ML SYRINGE FOR KALEIDOSCOPE
1.2500 mg | INTRAVITREAL | Status: AC | PRN
  Administered 2020-07-05: 1.25 mg via INTRAVITREAL

## 2020-08-02 ENCOUNTER — Encounter (INDEPENDENT_AMBULATORY_CARE_PROVIDER_SITE_OTHER): Payer: Medicare HMO | Admitting: Ophthalmology

## 2020-08-04 NOTE — Progress Notes (Signed)
[facility] Note  08/07/2020     CHIEF COMPLAINT Patient presents for Retina Follow Up   HISTORY OF PRESENT ILLNESS: Carly Romero is a 74 y.o. female who presents to the clinic today for:   HPI    Retina Follow Up    Patient presents with  CRVO/BRVO.  In right eye.  This started weeks ago.  Severity is moderate.  Duration of weeks.  Since onset it is stable.  I, the attending physician,  performed the HPI with the patient and updated documentation appropriately.          Comments    Pt states vision is about the same OU.  Pt denies eye pain or discomfort and denies any new or worsening floaters or fol OU.       Last edited by Bernarda Caffey, MD on 08/07/2020  1:59 PM. (History)      Referring physician: Aretta Nip, MD [place],  [place] [id number]  HISTORICAL INFORMATION:   Selected notes from the MEDICAL RECORD NUMBER Referral from Dr. Kathlen Mody for eval of CRVO w/edema OD.   CURRENT MEDICATIONS: Current Outpatient Medications (Ophthalmic Drugs)  Medication Sig  . fluorometholone (FML) 0.1 % ophthalmic suspension  (Patient not taking: Reported on 06/07/2020)  . Polyethyl Glycol-Propyl Glycol (SYSTANE OP) Place 1 drop into both eyes daily as needed (dry eyes).    No current facility-administered medications for this visit. (Ophthalmic Drugs)   Current Outpatient Medications (Other)  Medication Sig  . albuterol (VENTOLIN HFA) 108 (90 Base) MCG/ACT inhaler Inhale 2 puffs into the lungs every 6 (six) hours as needed for wheezing or shortness of breath.  Marland Kitchen BIOTIN PO Take 1 tablet by mouth daily.  . Calcium Carbonate-Vitamin D (CALCIUM + D PO) Take 1 tablet by mouth daily.  Marland Kitchen diltiazem (CARDIZEM CD) 240 MG 24 hr capsule Take 1 capsule (240 mg total) by mouth daily.  Marland Kitchen ELIQUIS 5 MG TABS tablet Take 1 tablet (5 mg total) by mouth 2 (two) times daily.  Marland Kitchen FLUoxetine (PROZAC) 20 MG capsule Take 20 mg by mouth daily.  .  Fluoxetine HCl, PMDD, 20 MG TABS Take by mouth.  . furosemide (LASIX) 40 MG tablet Take 1 tablet (40 mg total) by mouth daily as needed for fluid or edema.  Marland Kitchen levothyroxine (SYNTHROID) 125 MCG tablet Take 125 mcg by mouth daily.  Marland Kitchen loratadine (CLARITIN) 10 MG tablet Take 10 mg by mouth daily as needed for allergies.   Marland Kitchen losartan (COZAAR) 50 MG tablet Take 1 tablet (50 mg total) by mouth daily.  . metoprolol tartrate (LOPRESSOR) 50 MG tablet Take 0.5 tablets (25 mg total) by mouth 2 (two) times daily.  . Multiple Vitamin (MULTIVITAMIN WITH MINERALS) TABS tablet Take 1 tablet by mouth daily.  . potassium chloride (KLOR-CON) 10 MEQ tablet Take by mouth.   . potassium chloride (KLOR-CON) 10 MEQ tablet Take 1 tablet (10 mEq total) by mouth daily. Please keep upcoming appt in July with Dr. Lovena Le before anymore refills. Thank you  . rosuvastatin (CRESTOR) 40 MG tablet Take 40 mg by mouth daily.  Marland Kitchen SHINGRIX injection   . Tiotropium Bromide-Olodaterol (STIOLTO RESPIMAT) 2.5-2.5 MCG/ACT AERS Inhale 2 puffs into the lungs daily.   No current facility-administered medications for this visit. (Other)      REVIEW OF SYSTEMS: ROS    Positive for: Cardiovascular, Eyes, Respiratory   Negative for: Constitutional, Gastrointestinal, Neurological, Skin, Genitourinary, Musculoskeletal, HENT, Endocrine, Psychiatric, Allergic/Imm,  Heme/Lymph   Last edited by Doneen Poisson on 08/07/2020  1:21 PM. (History)       ALLERGIES Allergies  Allergen Reactions  . Amiodarone Other (See Comments)    alopecia  . Chantix [Varenicline]     Suicidal thoughts  . Codeine Swelling    Swelling as a young adult  . Lipitor [Atorvastatin] Diarrhea    And cramping.    PAST MEDICAL HISTORY Past Medical History:  Diagnosis Date  . Atrial fibrillation ([place])   . Factor V Leiden ([place])   . Hyperlipidemia   . Hypertension   . Hypertensive heart disease   . Hypertensive retinopathy    OU  . Hypothyroidism   .  Macular degeneration    Dry OU  . Obesity (BMI 30-39.9)   . Thyroid disease    Past Surgical History:  Procedure Laterality Date  . ATRIAL FIBRILLATION ABLATION  12/2014   Dr Glennon Mac at [facility]  . ATRIAL FIBRILLATION ABLATION  08/2015   Dr Glennon Mac at [facility]  . CARDIOVERSION N/A 05/26/2014   Procedure: CARDIOVERSION;  Surgeon: Jacolyn Reedy, MD;  Location: [facility] ENDOSCOPY;  Service: Cardiovascular;  Laterality: N/A;  . CARDIOVERSION N/A 09/14/2014   Procedure: CARDIOVERSION;  Surgeon: Jacolyn Reedy, MD;  Location: [facility] ENDOSCOPY;  Service: Cardiovascular;  Laterality: N/A;  . CARDIOVERSION N/A 05/11/2015   Procedure: CARDIOVERSION;  Surgeon: Jacolyn Reedy, MD;  Location: [place];  Service: Cardiovascular;  Laterality: N/A;  . CATARACT EXTRACTION Bilateral   . COSMETIC SURGERY     lower lids  . DENTAL SURGERY     wisdom teeth  . EYE SURGERY Bilateral    Cat Sx OU  . PACEMAKER IMPLANT N/A 02/26/2018   Procedure: PACEMAKER IMPLANT;  Surgeon: Evans Lance, MD;  Location: [place] [facility];  Service: Cardiovascular;  Laterality: N/A;    FAMILY HISTORY Family History  Problem Relation Age of Onset  . Diabetes Brother     SOCIAL HISTORY Social History   Tobacco Use  . Smoking status: Former Smoker    Packs/day: 1.00    Years: 40.00    Pack years: 40.00    Quit date: 12/11/2013    Years since quitting: 6.6  . Smokeless tobacco: Never Used  Substance Use Topics  . Alcohol use: Yes    Alcohol/week: 8.0 standard drinks    Types: 8 Glasses of wine per week  . Drug use: No         OPHTHALMIC EXAM:  Base Eye Exam    Visual Acuity (Snellen - Linear)      Right Left   Dist cc 20/40 20/20   Dist ph cc 20/30 -1    Correction: Glasses       Tonometry (Tonopen, 1:27 PM)      Right Left   Pressure 12 11       Pupils      Dark Light Shape React APD   Right 3 2 Round Brisk 0   Left 3 2 Round Brisk 0       Visual Fields      Left Right    Full Full        Extraocular Movement      Right Left    Full Full       Neuro/Psych    Oriented x3: Yes   Mood/Affect: Normal       Dilation    Both eyes: 1.0% Mydriacyl, 2.5% Phenylephrine @ 1:27 PM  Slit Lamp and Fundus Exam    Slit Lamp Exam      Right Left   Lids/Lashes Dermatochalasis - upper lid Dermatochalasis - upper lid   Conjunctiva/Sclera White and quiet White and quiet   Cornea Trace PEE, mild arcus, well healed temporal cataract wound, EBMD 1+ PEE, mild arcus, well healed temporal cataract wound   Anterior Chamber deep and clear deep and clear   Iris round and dilated round and dilated   Lens PC IOL in perfect position, open PC PC IOL in perfect position, open PC   Vitreous syneresis syneresis       Fundus Exam      Right Left   Disc Optic disc edema, hyperemia, vascular loops, focal PPP temporal compact, mild tilt, pink and sharp   C/D Ratio 0.2 0.4   Macula Good foveal reflex, trace cystic changes, central IRF/CME improved, no heme flat, good foveal reflex, mild RPE mottling and clumping, trace drusen, no edema, rare IRH   Vessels Dilated and Tortuous, +CRVO attenuated, tortuous, mild A/V crossing changes   Periphery Attached, scattered IRH - improved, mild reticular degeneration attached, mild reticular degeneration        Refraction    Wearing Rx      Sphere Cylinder Add   Right -0.75 Sphere +2.50   Left -0.50 Sphere +2.50   Type: PAL          IMAGING AND PROCEDURES  Imaging and Procedures for @TODAY @  OCT, Retina - OU - Both Eyes       Right Eye Quality was good. Central Foveal Thickness: 261. Progression has been stable. Findings include intraretinal fluid, normal foveal contour, no SRF (Interval improvement in IRF/CME, Trace cystic changes remain, persistent disc edema).   Left Eye Quality was good. Central Foveal Thickness: 249. Progression has been stable. Findings include normal foveal contour, no IRF, no SRF, retinal drusen  (Trace drusen).    Notes *Images captured and stored on drive  Diagnosis / Impression:  OD: Interval improvement in IRF/CME, Trace cystic changes remain, persistent disc edema OS: NFP, no IRF/SRF; Trace drusen  Clinical management:  See below  Abbreviations: NFP - Normal foveal profile. CME - cystoid macular edema. PED - pigment epithelial detachment. IRF - intraretinal fluid. SRF - subretinal fluid. EZ - ellipsoid zone. ERM - epiretinal membrane. ORA - outer retinal atrophy. ORT - outer retinal tubulation. SRHM - subretinal hyper-reflective material        Intravitreal Injection, Pharmacologic Agent - OD - Right Eye       Time Out 08/07/2020. 1:59 PM. Confirmed correct patient, procedure, site, and patient consented.   Anesthesia Topical anesthesia was used. Anesthetic medications included Lidocaine 2%, Proparacaine 0.5%.   Procedure Preparation included 5% betadine to ocular surface, eyelid speculum. A (32g) needle was used.   Injection:  1.25 mg Bevacizumab (AVASTIN) 1.25mg /0.88mL SOLN   NDC: 37902-409-73, Lot: [id number], Expiration date: 09/18/2020   Route: Intravitreal, Site: Right Eye, Waste: 0.05 mL  Post-op Post injection exam found visual acuity of at least counting fingers. The patient tolerated the procedure well. There were no complications. The patient received written and verbal post procedure care education. Post injection medications were not given.                 ASSESSMENT/PLAN:    ICD-10-CM   1. Central retinal vein occlusion with macular edema of right eye  H34.8110 Intravitreal Injection, Pharmacologic Agent - OD - Right Eye    Bevacizumab (AVASTIN) SOLN  1.25 mg  2. Retinal edema  H35.81 OCT, Retina - OU - Both Eyes  3. Factor V Leiden ([place])  D68.51   4. Essential hypertension  I10   5. Hypertensive retinopathy of both eyes  H35.033   6. Pseudophakia of both eyes  Z96.1     1-3. CRVO with macular edema OD        History of Factor V Leiden on Eliquis  - pt  reports history of blood clots / DVTs  - significant CV history with A fib s/p ablation and pacemaker implantation             - s/p IVA OD # 1 (03.09.21), #2 (04.06.21), #3 (5.4.21), #4 (06.01.21), #5 (07.07.21), #6 (08.11.21), #7 (09.15.2021), #8 (10.20.21), #9 (11.17.21)  **history of recurrent CME at 5 wk interval (10.20.21)**  - FA (04.06.21) shows delayed filling time, hyperfluorescence of the disc, mild focal leakage along IT arcades, mild focal leakage perifovea  - OCT today shows interval improvement in IRF/CME.  Trace cystic changes remain OD  - BCVA stable at 20/30 today   - recommend IVA OD #10 today, 12.20.21, w/ f/u in 4 wks  - pt wishes to proceed  - RBA of procedure discussed, questions answered  - informed consent obtained  - IVA consent form signed and scanned 03.09.21  - see procedure note  - return 4-5 weeks DFE, OCT, possible injection  4,5. Hypertensive retinopathy OU  - discussed importance of tight BP control  - monitor  6. Pseudophakia OU  - s/p CE/IOL OU w/ Dr. Valetta Close  - IOLs in good position   - monitor  Ophthalmic Meds Ordered this visit:  Meds ordered this encounter  Medications  . Bevacizumab (AVASTIN) SOLN 1.25 mg       Return for f/u 4-5 weeks, CRVO OD, DFE, OCT.  There are no Patient Instructions on file for this visit.  This document serves as a record of services personally performed by Gardiner Sleeper, MD, PhD. It was created on their behalf by San Jetty. Owens Shark, OA an ophthalmic technician. The creation of this record is the provider's dictation and/or activities during the visit.    Electronically signed by: San Jetty. Owens Shark, [place] 12.17.2021 2:09 PM  Gardiner Sleeper, M.D., Ph.D. Diseases & Surgery of the Retina and Vitreous [facility]  I have reviewed the above documentation for accuracy and completeness, and I agree with the above. Gardiner Sleeper, M.D., Ph.D. 08/07/20 2:09 PM   Abbreviations: M myopia  (nearsighted); A astigmatism; H hyperopia (farsighted); P presbyopia; Mrx spectacle prescription;  CTL contact lenses; OD right eye; OS left eye; OU both eyes  XT exotropia; ET esotropia; PEK punctate epithelial keratitis; PEE punctate epithelial erosions; DES dry eye syndrome; MGD meibomian gland dysfunction; ATs artificial tears; PFAT's preservative free artificial tears; [place] nuclear sclerotic cataract; PSC posterior subcapsular cataract; ERM epi-retinal membrane; PVD posterior vitreous detachment; RD retinal detachment; DM diabetes mellitus; DR diabetic retinopathy; NPDR non-proliferative diabetic retinopathy; PDR proliferative diabetic retinopathy; CSME clinically significant macular edema; DME diabetic macular edema; dbh dot blot hemorrhages; CWS cotton wool spot; POAG primary open angle glaucoma; C/D cup-to-disc ratio; HVF humphrey visual field; GVF goldmann visual field; OCT optical coherence tomography; IOP intraocular pressure; BRVO Branch retinal vein occlusion; CRVO central retinal vein occlusion; CRAO central retinal artery occlusion; BRAO branch retinal artery occlusion; RT retinal tear; SB scleral buckle; PPV pars plana vitrectomy; VH Vitreous hemorrhage; PRP panretinal laser photocoagulation; IVK intravitreal kenalog;  VMT vitreomacular traction; MH Macular hole;  NVD neovascularization of the disc; NVE neovascularization elsewhere; AREDS age related eye disease study; ARMD age related macular degeneration; POAG primary open angle glaucoma; EBMD epithelial/anterior basement membrane dystrophy; ACIOL anterior chamber intraocular lens; IOL intraocular lens; PCIOL posterior chamber intraocular lens; Phaco/IOL phacoemulsification with intraocular lens placement; [place] photorefractive keratectomy; LASIK laser assisted in situ keratomileusis; HTN hypertension; DM diabetes mellitus; COPD chronic obstructive pulmonary disease.

## 2020-08-07 ENCOUNTER — Encounter (INDEPENDENT_AMBULATORY_CARE_PROVIDER_SITE_OTHER): Payer: Self-pay | Admitting: Ophthalmology

## 2020-08-07 ENCOUNTER — Ambulatory Visit (INDEPENDENT_AMBULATORY_CARE_PROVIDER_SITE_OTHER): Payer: Medicare HMO | Admitting: Ophthalmology

## 2020-08-07 ENCOUNTER — Other Ambulatory Visit: Payer: Self-pay

## 2020-08-07 DIAGNOSIS — H35033 Hypertensive retinopathy, bilateral: Secondary | ICD-10-CM

## 2020-08-07 DIAGNOSIS — H34811 Central retinal vein occlusion, right eye, with macular edema: Secondary | ICD-10-CM

## 2020-08-07 DIAGNOSIS — Z961 Presence of intraocular lens: Secondary | ICD-10-CM | POA: Diagnosis not present

## 2020-08-07 DIAGNOSIS — I10 Essential (primary) hypertension: Secondary | ICD-10-CM

## 2020-08-07 DIAGNOSIS — D6851 Activated protein C resistance: Secondary | ICD-10-CM | POA: Diagnosis not present

## 2020-08-07 DIAGNOSIS — E039 Hypothyroidism, unspecified: Secondary | ICD-10-CM | POA: Diagnosis not present

## 2020-08-07 DIAGNOSIS — H3581 Retinal edema: Secondary | ICD-10-CM

## 2020-08-07 MED ORDER — BEVACIZUMAB CHEMO INJECTION 1.25MG/0.05ML SYRINGE FOR KALEIDOSCOPE
1.2500 mg | INTRAVITREAL | Status: AC | PRN
  Administered 2020-08-07: 1.25 mg via INTRAVITREAL

## 2020-09-08 NOTE — Progress Notes (Signed)
[facility] Note  09/11/2020     CHIEF COMPLAINT Patient presents for Retina Follow Up   HISTORY OF PRESENT ILLNESS: Carly Romero is a 75 y.o. female who presents to the clinic today for:   HPI    Retina Follow Up    Patient presents with  CRVO/BRVO.  In right eye.  Duration of 5 weeks.  Since onset it is stable.  I, the attending physician,  performed the HPI with the patient and updated documentation appropriately.          Comments    5 week follow up CRVO OD-  Vision stable OU. Using Restasis BID OU.        Last edited by Bernarda Caffey, MD on 09/11/2020 10:43 PM. (History)    pt states her eyes hurt today from not being able to sleep last night  Referring physician: Aretta Nip, MD [place],  [place] [address]  HISTORICAL INFORMATION:   Selected notes from the MEDICAL RECORD NUMBER Referral from Dr. Kathlen Mody for eval of CRVO w/edema OD.   CURRENT MEDICATIONS: Current Outpatient Medications (Ophthalmic Drugs)  Medication Sig  . Polyethyl Glycol-Propyl Glycol (SYSTANE OP) Place 1 drop into both eyes daily as needed (dry eyes).   . fluorometholone (FML) 0.1 % ophthalmic suspension  (Patient not taking: No sig reported)   No current facility-administered medications for this visit. (Ophthalmic Drugs)   Current Outpatient Medications (Other)  Medication Sig  . albuterol (VENTOLIN HFA) 108 (90 Base) MCG/ACT inhaler Inhale 2 puffs into the lungs every 6 (six) hours as needed for wheezing or shortness of breath.  Marland Kitchen BIOTIN PO Take 1 tablet by mouth daily.  . Calcium Carbonate-Vitamin D (CALCIUM + D PO) Take 1 tablet by mouth daily.  Marland Kitchen diltiazem (CARDIZEM CD) 240 MG 24 hr capsule Take 1 capsule (240 mg total) by mouth daily.  Marland Kitchen ELIQUIS 5 MG TABS tablet Take 1 tablet (5 mg total) by mouth 2 (two) times daily.  Marland Kitchen FLUoxetine (PROZAC) 20 MG capsule Take 20 mg by mouth daily.  . Fluoxetine HCl, PMDD, 20 MG TABS Take by mouth.   . furosemide (LASIX) 40 MG tablet Take 1 tablet (40 mg total) by mouth daily as needed for fluid or edema.  Marland Kitchen levothyroxine (SYNTHROID) 125 MCG tablet Take 125 mcg by mouth daily.  Marland Kitchen loratadine (CLARITIN) 10 MG tablet Take 10 mg by mouth daily as needed for allergies.   Marland Kitchen losartan (COZAAR) 50 MG tablet Take 1 tablet (50 mg total) by mouth daily.  . metoprolol tartrate (LOPRESSOR) 50 MG tablet Take 0.5 tablets (25 mg total) by mouth 2 (two) times daily.  . Multiple Vitamin (MULTIVITAMIN WITH MINERALS) TABS tablet Take 1 tablet by mouth daily.  . potassium chloride (KLOR-CON) 10 MEQ tablet Take by mouth.   . potassium chloride (KLOR-CON) 10 MEQ tablet Take 1 tablet (10 mEq total) by mouth daily. Please keep upcoming appt in July with Dr. Lovena Le before anymore refills. Thank you  . rosuvastatin (CRESTOR) 40 MG tablet Take 40 mg by mouth daily.  Marland Kitchen SHINGRIX injection   . Tiotropium Bromide-Olodaterol (STIOLTO RESPIMAT) 2.5-2.5 MCG/ACT AERS Inhale 2 puffs into the lungs daily.   No current facility-administered medications for this visit. (Other)      REVIEW OF SYSTEMS: ROS    Positive for: Musculoskeletal, Endocrine, Cardiovascular, Eyes, Respiratory   Negative for: Constitutional, Gastrointestinal, Neurological, Skin, Genitourinary, HENT, Psychiatric, Allergic/Imm, Heme/Lymph   Last edited by  Leonie Douglas, COA on 09/11/2020  1:19 PM. (History)       ALLERGIES Allergies  Allergen Reactions  . Amiodarone Other (See Comments)    alopecia  . Chantix [Varenicline]     Suicidal thoughts  . Codeine Swelling    Swelling as a young adult  . Lipitor [Atorvastatin] Diarrhea    And cramping.    PAST MEDICAL HISTORY Past Medical History:  Diagnosis Date  . Atrial fibrillation ([place])   . Factor V Leiden ([place])   . Hyperlipidemia   . Hypertension   . Hypertensive heart disease   . Hypertensive retinopathy    OU  . Hypothyroidism   . Macular degeneration    Dry OU  . Obesity (BMI  30-39.9)   . Thyroid disease    Past Surgical History:  Procedure Laterality Date  . ATRIAL FIBRILLATION ABLATION  12/2014   Dr Glennon Mac at [facility]  . ATRIAL FIBRILLATION ABLATION  08/2015   Dr Glennon Mac at [facility]  . CARDIOVERSION N/A 05/26/2014   Procedure: CARDIOVERSION;  Surgeon: Jacolyn Reedy, MD;  Location: [facility] ENDOSCOPY;  Service: Cardiovascular;  Laterality: N/A;  . CARDIOVERSION N/A 09/14/2014   Procedure: CARDIOVERSION;  Surgeon: Jacolyn Reedy, MD;  Location: [facility] ENDOSCOPY;  Service: Cardiovascular;  Laterality: N/A;  . CARDIOVERSION N/A 05/11/2015   Procedure: CARDIOVERSION;  Surgeon: Jacolyn Reedy, MD;  Location: [place];  Service: Cardiovascular;  Laterality: N/A;  . CATARACT EXTRACTION Bilateral   . COSMETIC SURGERY     lower lids  . DENTAL SURGERY     wisdom teeth  . EYE SURGERY Bilateral    Cat Sx OU  . PACEMAKER IMPLANT N/A 02/26/2018   Procedure: PACEMAKER IMPLANT;  Surgeon: Evans Lance, MD;  Location: [place] [facility];  Service: Cardiovascular;  Laterality: N/A;    FAMILY HISTORY Family History  Problem Relation Age of Onset  . Diabetes Brother     SOCIAL HISTORY Social History   Tobacco Use  . Smoking status: Former Smoker    Packs/day: 1.00    Years: 40.00    Pack years: 40.00    Quit date: 12/11/2013    Years since quitting: 6.7  . Smokeless tobacco: Never Used  Substance Use Topics  . Alcohol use: Yes    Alcohol/week: 8.0 standard drinks    Types: 8 Glasses of wine per week  . Drug use: No         OPHTHALMIC EXAM:  Base Eye Exam    Visual Acuity (Snellen - Linear)      Right Left   Dist cc 20/50 +2 20/20   Dist ph cc 20/40-        Tonometry (Tonopen, 1:27 PM)      Right Left   Pressure 16 18       Pupils      Dark Light Shape React APD   Right 3 2 Round Brisk None   Left 3 2 Round Brisk None       Visual Fields (Counting fingers)      Left Right    Full Full       Extraocular Movement      Right Left    Full  Full       Neuro/Psych    Oriented x3: Yes   Mood/Affect: Normal       Dilation    Both eyes: 1.0% Mydriacyl, 2.5% Phenylephrine @ 1:27 PM        Slit Lamp and Fundus Exam  Slit Lamp Exam      Right Left   Lids/Lashes Dermatochalasis - upper lid Dermatochalasis - upper lid   Conjunctiva/Sclera White and quiet White and quiet   Cornea Trace PEE, mild arcus, well healed temporal cataract wound, EBMD 1+ PEE, mild arcus, well healed temporal cataract wound   Anterior Chamber deep and clear deep and clear   Iris round and dilated round and dilated   Lens PC IOL in perfect position, open PC PC IOL in perfect position, open PC   Vitreous syneresis syneresis       Fundus Exam      Right Left   Disc Optic disc edema, hyperemia, vascular loops, focal PPP temporal compact, mild tilt, pink and sharp   C/D Ratio 0.2 0.4   Macula Good foveal reflex, trace cystic changes - slightly increased, central IRF/CME increased, no heme flat, good foveal reflex, mild RPE mottling and clumping, trace drusen, no edema, rare IRH   Vessels Dilated and Tortuous, +CRVO attenuated, tortuous, mild A/V crossing changes   Periphery Attached, scattered IRH - improved, mild reticular degeneration attached, mild reticular degeneration          IMAGING AND PROCEDURES  Imaging and Procedures for @TODAY @  OCT, Retina - OU - Both Eyes       Right Eye Quality was good. Central Foveal Thickness: 266. Progression has worsened. Findings include intraretinal fluid, normal foveal contour, no SRF (Interval increase in IRF/CME nasal fovea, persistent disc edema).   Left Eye Quality was good. Central Foveal Thickness: 253. Progression has been stable. Findings include normal foveal contour, no IRF, no SRF, retinal drusen  (Trace drusen).   Notes *Images captured and stored on drive  Diagnosis / Impression:  OD: Interval increase in IRF/CME nasal fovea, persistent disc edema OS: NFP, no IRF/SRF; Trace  drusen  Clinical management:  See below  Abbreviations: NFP - Normal foveal profile. CME - cystoid macular edema. PED - pigment epithelial detachment. IRF - intraretinal fluid. SRF - subretinal fluid. EZ - ellipsoid zone. ERM - epiretinal membrane. ORA - outer retinal atrophy. ORT - outer retinal tubulation. SRHM - subretinal hyper-reflective material        Intravitreal Injection, Pharmacologic Agent - OD - Right Eye       Time Out 09/11/2020. 2:12 PM. Confirmed correct patient, procedure, site, and patient consented.   Anesthesia Topical anesthesia was used. Anesthetic medications included Lidocaine 2%, Proparacaine 0.5%.   Procedure Preparation included 5% betadine to ocular surface, eyelid speculum. A supplied needle was used.   Injection:  1.25 mg Bevacizumab (AVASTIN) 1.25mg /0.64mL SOLN   NDC: [id number], Lot: [id number] , Expiration date: 10/25/2020   Route: Intravitreal, Site: Right Eye, Waste: 0 mL  Post-op Post injection exam found visual acuity of at least counting fingers. The patient tolerated the procedure well. There were no complications. The patient received written and verbal post procedure care education. Post injection medications were not given.                 ASSESSMENT/PLAN:    ICD-10-CM   1. Central retinal vein occlusion with macular edema of right eye  H34.8110 Intravitreal Injection, Pharmacologic Agent - OD - Right Eye    Bevacizumab (AVASTIN) SOLN 1.25 mg  2. Retinal edema  H35.81 OCT, Retina - OU - Both Eyes  3. Factor V Leiden ([place])  D68.51   4. Essential hypertension  I10   5. Hypertensive retinopathy of both eyes  H35.033   6. Pseudophakia of both eyes  Z96.1     1-3. CRVO with macular edema OD        History of Factor V Leiden on Eliquis  - pt reports history of blood clots / DVTs  - significant CV history with A fib s/p ablation and pacemaker implantation             - s/p IVA OD # 1 (03.09.21), #2 (04.06.21), #3 (5.4.21), #4  (06.01.21), #5 (07.07.21), #6 (08.11.21), #7 (09.15.2021), #8 (10.20.21), #9 (11.17.21), #10 (12.20.21)  **history of recurrent CME at 5 wk interval (10.20.21)**  - FA (04.06.21) shows delayed filling time, hyperfluorescence of the disc, mild focal leakage along IT arcades, mild focal leakage perifovea  - OCT today shows interval increase in IRF/CME nasal fovea, persistent disc edema at 5 wks again  - BCVA decreased to 20/40 from 20/30 today   - recommend IVA OD #11 today, 01.24.22 w/ reduction of interval back to 4 wks   - pt wishes to proceed  - RBA of procedure discussed, questions answered  - IVA informed consent obtained and signed (OD)  - see procedure note  - return 4 week DFE, OCT, possible injection  4,5. Hypertensive retinopathy OU  - discussed importance of tight BP control  - monitor  6. Pseudophakia OU  - s/p CE/IOL OU w/ Dr. Valetta Close  - IOLs in good position   - monitor  Ophthalmic Meds Ordered this visit:  Meds ordered this encounter  Medications  . Bevacizumab (AVASTIN) SOLN 1.25 mg      Return in about 4 weeks (around 10/09/2020) for f/u CRVO OD, DFE, OCT.  There are no Patient Instructions on file for this visit.  This document serves as a record of services personally performed by Gardiner Sleeper, MD, PhD. It was created on their behalf by Estill Bakes, COT an ophthalmic technician. The creation of this record is the provider's dictation and/or activities during the visit.    Electronically signed by: Estill Bakes, COT 1.21.22 @ 10:51 PM   This document serves as a record of services personally performed by Gardiner Sleeper, MD, PhD. It was created on their behalf by San Jetty. Owens Shark, OA an ophthalmic technician. The creation of this record is the provider's dictation and/or activities during the visit.    Electronically signed by: San Jetty. Owens Shark, [place] 01.24.2022 10:51 PM  Gardiner Sleeper, M.D., Ph.D. Diseases & Surgery of [facility] [place] 09/11/2020   I have reviewed the above documentation for accuracy and completeness, and I agree with the above. Gardiner Sleeper, M.D., Ph.D. 09/11/20 10:51 PM  Abbreviations: M myopia (nearsighted); A astigmatism; H hyperopia (farsighted); P presbyopia; Mrx spectacle prescription;  CTL contact lenses; OD right eye; OS left eye; OU both eyes  XT exotropia; ET esotropia; PEK punctate epithelial keratitis; PEE punctate epithelial erosions; DES dry eye syndrome; MGD meibomian gland dysfunction; ATs artificial tears; PFAT's preservative free artificial tears; [place] nuclear sclerotic cataract; PSC posterior subcapsular cataract; ERM epi-retinal membrane; PVD posterior vitreous detachment; RD retinal detachment; DM diabetes mellitus; DR diabetic retinopathy; NPDR non-proliferative diabetic retinopathy; PDR proliferative diabetic retinopathy; CSME clinically significant macular edema; DME diabetic macular edema; dbh dot blot hemorrhages; CWS cotton wool spot; POAG primary open angle glaucoma; C/D cup-to-disc ratio; HVF humphrey visual field; GVF goldmann visual field; OCT optical coherence tomography; IOP intraocular pressure; BRVO Branch retinal vein occlusion; CRVO central retinal vein occlusion; CRAO central retinal artery occlusion; BRAO branch retinal artery occlusion;  RT retinal tear; SB scleral buckle; PPV pars plana vitrectomy; VH Vitreous hemorrhage; PRP panretinal laser photocoagulation; IVK intravitreal kenalog; VMT vitreomacular traction; MH Macular hole;  NVD neovascularization of the disc; NVE neovascularization elsewhere; AREDS age related eye disease study; ARMD age related macular degeneration; POAG primary open angle glaucoma; EBMD epithelial/anterior basement membrane dystrophy; ACIOL anterior chamber intraocular lens; IOL intraocular lens; PCIOL posterior chamber intraocular lens; Phaco/IOL phacoemulsification with intraocular lens placement; [place] photorefractive keratectomy; LASIK  laser assisted in situ keratomileusis; HTN hypertension; DM diabetes mellitus; COPD chronic obstructive pulmonary disease.

## 2020-09-11 ENCOUNTER — Other Ambulatory Visit: Payer: Self-pay

## 2020-09-11 ENCOUNTER — Encounter (INDEPENDENT_AMBULATORY_CARE_PROVIDER_SITE_OTHER): Payer: Self-pay | Admitting: Ophthalmology

## 2020-09-11 ENCOUNTER — Ambulatory Visit (INDEPENDENT_AMBULATORY_CARE_PROVIDER_SITE_OTHER): Payer: Medicare HMO | Admitting: Ophthalmology

## 2020-09-11 DIAGNOSIS — H35033 Hypertensive retinopathy, bilateral: Secondary | ICD-10-CM | POA: Diagnosis not present

## 2020-09-11 DIAGNOSIS — I10 Essential (primary) hypertension: Secondary | ICD-10-CM | POA: Diagnosis not present

## 2020-09-11 DIAGNOSIS — H34811 Central retinal vein occlusion, right eye, with macular edema: Secondary | ICD-10-CM | POA: Diagnosis not present

## 2020-09-11 DIAGNOSIS — H3581 Retinal edema: Secondary | ICD-10-CM

## 2020-09-11 DIAGNOSIS — Z961 Presence of intraocular lens: Secondary | ICD-10-CM | POA: Diagnosis not present

## 2020-09-11 DIAGNOSIS — D6851 Activated protein C resistance: Secondary | ICD-10-CM | POA: Diagnosis not present

## 2020-09-11 MED ORDER — BEVACIZUMAB CHEMO INJECTION 1.25MG/0.05ML SYRINGE FOR KALEIDOSCOPE
1.2500 mg | INTRAVITREAL | Status: AC | PRN
  Administered 2020-09-11: 1.25 mg via INTRAVITREAL

## 2020-09-20 DIAGNOSIS — Z1231 Encounter for screening mammogram for malignant neoplasm of breast: Secondary | ICD-10-CM | POA: Diagnosis not present

## 2020-09-25 ENCOUNTER — Ambulatory Visit (INDEPENDENT_AMBULATORY_CARE_PROVIDER_SITE_OTHER): Payer: Medicare HMO

## 2020-09-25 DIAGNOSIS — I495 Sick sinus syndrome: Secondary | ICD-10-CM | POA: Diagnosis not present

## 2020-09-25 LAB — CUP PACEART REMOTE DEVICE CHECK
Battery Remaining Longevity: 134 mo
Battery Remaining Percentage: 95.5 %
Battery Voltage: 3.01 V
Brady Statistic RV Percent Paced: 32 %
Date Time Interrogation Session: 20220207022445
Implantable Lead Implant Date: 20190711
Implantable Lead Implant Date: 20190711
Implantable Lead Location: 753859
Implantable Lead Location: 753860
Implantable Pulse Generator Implant Date: 20190711
Lead Channel Impedance Value: 590 Ohm
Lead Channel Pacing Threshold Amplitude: 1 V
Lead Channel Pacing Threshold Pulse Width: 0.4 ms
Lead Channel Sensing Intrinsic Amplitude: 12 mV
Lead Channel Setting Pacing Amplitude: 2.5 V
Lead Channel Setting Pacing Pulse Width: 0.4 ms
Lead Channel Setting Sensing Sensitivity: 2 mV
Pulse Gen Model: 2272
Pulse Gen Serial Number: 9041042

## 2020-10-02 NOTE — Progress Notes (Signed)
Remote pacemaker transmission.   

## 2020-10-05 NOTE — Progress Notes (Signed)
[facility] Note  10/10/2020     CHIEF COMPLAINT Patient presents for Retina Follow Up   HISTORY OF PRESENT ILLNESS: Carly Romero is a 75 y.o. female who presents to the clinic today for:   HPI    Retina Follow Up    Patient presents with  CRVO/BRVO.  In right eye.  This started 4 weeks ago.  I, the attending physician,  performed the HPI with the patient and updated documentation appropriately.          Comments    Patient here for 4 weeks retina follow up for BRVO OD. Patient states vision doing the same. No eye pain.        Last edited by Bernarda Caffey, MD on 10/10/2020  4:26 PM. (History)    pt states no change in vision OD  Referring physician: Aretta Nip, MD [place],  [place] [id number]  HISTORICAL INFORMATION:   Selected notes from the MEDICAL RECORD NUMBER Referral from Dr. Kathlen Mody for eval of CRVO w/edema OD.   CURRENT MEDICATIONS: Current Outpatient Medications (Ophthalmic Drugs)  Medication Sig  . fluorometholone (FML) 0.1 % ophthalmic suspension  (Patient not taking: No sig reported)  . Polyethyl Glycol-Propyl Glycol (SYSTANE OP) Place 1 drop into both eyes daily as needed (dry eyes).    No current facility-administered medications for this visit. (Ophthalmic Drugs)   Current Outpatient Medications (Other)  Medication Sig  . albuterol (VENTOLIN HFA) 108 (90 Base) MCG/ACT inhaler Inhale 2 puffs into the lungs every 6 (six) hours as needed for wheezing or shortness of breath.  Marland Kitchen BIOTIN PO Take 1 tablet by mouth daily.  . Calcium Carbonate-Vitamin D (CALCIUM + D PO) Take 1 tablet by mouth daily.  Marland Kitchen diltiazem (CARDIZEM CD) 240 MG 24 hr capsule Take 1 capsule (240 mg total) by mouth daily.  Marland Kitchen ELIQUIS 5 MG TABS tablet Take 1 tablet (5 mg total) by mouth 2 (two) times daily.  Marland Kitchen FLUoxetine (PROZAC) 20 MG capsule Take 20 mg by mouth daily.  . Fluoxetine HCl, PMDD, 20 MG TABS Take by mouth.  . furosemide (LASIX)  40 MG tablet Take 1 tablet (40 mg total) by mouth daily as needed for fluid or edema.  Marland Kitchen levothyroxine (SYNTHROID) 125 MCG tablet Take 125 mcg by mouth daily.  Marland Kitchen loratadine (CLARITIN) 10 MG tablet Take 10 mg by mouth daily as needed for allergies.   Marland Kitchen losartan (COZAAR) 50 MG tablet Take 1 tablet (50 mg total) by mouth daily.  . metoprolol tartrate (LOPRESSOR) 50 MG tablet Take 0.5 tablets (25 mg total) by mouth 2 (two) times daily.  . Multiple Vitamin (MULTIVITAMIN WITH MINERALS) TABS tablet Take 1 tablet by mouth daily.  . potassium chloride (KLOR-CON) 10 MEQ tablet Take by mouth.   . potassium chloride (KLOR-CON) 10 MEQ tablet Take 1 tablet (10 mEq total) by mouth daily. Please keep upcoming appt in July with Dr. Lovena Le before anymore refills. Thank you  . rosuvastatin (CRESTOR) 40 MG tablet Take 40 mg by mouth daily.  Marland Kitchen SHINGRIX injection   . Tiotropium Bromide-Olodaterol (STIOLTO RESPIMAT) 2.5-2.5 MCG/ACT AERS Inhale 2 puffs into the lungs daily.   No current facility-administered medications for this visit. (Other)      REVIEW OF SYSTEMS: ROS    Positive for: Musculoskeletal, Endocrine, Cardiovascular, Eyes, Respiratory   Negative for: Constitutional, Gastrointestinal, Neurological, Skin, Genitourinary, HENT, Psychiatric, Allergic/Imm, Heme/Lymph   Last edited by [name] Demark, COA on  10/10/2020  1:43 PM. (History)       ALLERGIES Allergies  Allergen Reactions  . Amiodarone Other (See Comments)    alopecia  . Chantix [Varenicline]     Suicidal thoughts  . Codeine Swelling    Swelling as a young adult  . Lipitor [Atorvastatin] Diarrhea    And cramping.    PAST MEDICAL HISTORY Past Medical History:  Diagnosis Date  . Atrial fibrillation ([place])   . Factor V Leiden ([place])   . Hyperlipidemia   . Hypertension   . Hypertensive heart disease   . Hypertensive retinopathy    OU  . Hypothyroidism   . Macular degeneration    Dry OU  . Obesity (BMI 30-39.9)   . Thyroid  disease    Past Surgical History:  Procedure Laterality Date  . ATRIAL FIBRILLATION ABLATION  12/2014   Dr Glennon Mac at [facility]  . ATRIAL FIBRILLATION ABLATION  08/2015   Dr Glennon Mac at [facility]  . CARDIOVERSION N/A 05/26/2014   Procedure: CARDIOVERSION;  Surgeon: Jacolyn Reedy, MD;  Location: [facility] ENDOSCOPY;  Service: Cardiovascular;  Laterality: N/A;  . CARDIOVERSION N/A 09/14/2014   Procedure: CARDIOVERSION;  Surgeon: Jacolyn Reedy, MD;  Location: [facility] ENDOSCOPY;  Service: Cardiovascular;  Laterality: N/A;  . CARDIOVERSION N/A 05/11/2015   Procedure: CARDIOVERSION;  Surgeon: Jacolyn Reedy, MD;  Location: [place];  Service: Cardiovascular;  Laterality: N/A;  . CATARACT EXTRACTION Bilateral   . COSMETIC SURGERY     lower lids  . DENTAL SURGERY     wisdom teeth  . EYE SURGERY Bilateral    Cat Sx OU  . PACEMAKER IMPLANT N/A 02/26/2018   Procedure: PACEMAKER IMPLANT;  Surgeon: Evans Lance, MD;  Location: [place] [facility];  Service: Cardiovascular;  Laterality: N/A;    FAMILY HISTORY Family History  Problem Relation Age of Onset  . Diabetes Brother     SOCIAL HISTORY Social History   Tobacco Use  . Smoking status: Former Smoker    Packs/day: 1.00    Years: 40.00    Pack years: 40.00    Quit date: 12/11/2013    Years since quitting: 6.8  . Smokeless tobacco: Never Used  Substance Use Topics  . Alcohol use: Yes    Alcohol/week: 8.0 standard drinks    Types: 8 Glasses of wine per week  . Drug use: No         OPHTHALMIC EXAM:  Base Eye Exam    Visual Acuity (Snellen - Linear)      Right Left   Dist cc 20/50 20/20   Dist ph cc 20/40 -2    Correction: Glasses       Tonometry (Tonopen, 1:40 PM)      Right Left   Pressure 15 16       Pupils      Dark Light Shape React APD   Right 3 2 Round Brisk None   Left 3 2 Round Brisk None       Visual Fields (Counting fingers)      Left Right    Full Full       Extraocular Movement      Right Left    Full  Full       Neuro/Psych    Oriented x3: Yes   Mood/Affect: Normal       Dilation    Both eyes: 1.0% Mydriacyl, 2.5% Phenylephrine @ 1:40 PM        Slit Lamp and Fundus Exam  Slit Lamp Exam      Right Left   Lids/Lashes Dermatochalasis - upper lid Dermatochalasis - upper lid   Conjunctiva/Sclera White and quiet White and quiet   Cornea Trace PEE, mild arcus, well healed temporal cataract wound, EBMD 1+ PEE, mild arcus, well healed temporal cataract wound   Anterior Chamber deep and clear deep and clear   Iris round and dilated round and dilated   Lens PC IOL in perfect position, open PC PC IOL in perfect position, open PC   Vitreous syneresis syneresis       Fundus Exam      Right Left   Disc Optic disc edema, hyperemia, vascular loops, focal PPP temporal compact, mild tilt, pink and sharp   C/D Ratio 0.2 0.4   Macula Good foveal reflex, trace cystic changes - improved, central IRF/CME decreased, rare MA flat, good foveal reflex, mild RPE mottling and clumping, trace drusen, no edema, rare IRH   Vessels Dilated and Tortuous, +CRVO attenuated, tortuous, mild A/V crossing changes   Periphery Attached, scattered IRH - improved, mild reticular degeneration attached, mild reticular degeneration        Refraction    Wearing Rx      Sphere Cylinder Add   Right -0.75 Sphere +2.50   Left -0.50 Sphere +2.50   Type: PAL          IMAGING AND PROCEDURES  Imaging and Procedures for @TODAY @  OCT, Retina - OU - Both Eyes       Right Eye Quality was good. Central Foveal Thickness: 260. Progression has improved. Findings include intraretinal fluid, normal foveal contour, no SRF (Interval improvement in SN edema and IRF -- just trace cystic changes remain, stable improvement in disc edema).   Left Eye Quality was good. Central Foveal Thickness: 255. Progression has been stable. Findings include normal foveal contour, no IRF, no SRF, retinal drusen  (Trace drusen).    Notes *Images captured and stored on drive  Diagnosis / Impression:  OD: Interval improvement in SN edema and IRF -- just trace cystic changes remain, stable improvement in disc edema OS: NFP, no IRF/SRF; Trace drusen  Clinical management:  See below  Abbreviations: NFP - Normal foveal profile. CME - cystoid macular edema. PED - pigment epithelial detachment. IRF - intraretinal fluid. SRF - subretinal fluid. EZ - ellipsoid zone. ERM - epiretinal membrane. ORA - outer retinal atrophy. ORT - outer retinal tubulation. SRHM - subretinal hyper-reflective material        Intravitreal Injection, Pharmacologic Agent - OD - Right Eye       Time Out 10/10/2020. 2:04 PM. Confirmed correct patient, procedure, site, and patient consented.   Anesthesia Topical anesthesia was used. Anesthetic medications included Lidocaine 2%, Proparacaine 0.5%.   Procedure Preparation included 5% betadine to ocular surface, eyelid speculum. A supplied needle was used.   Injection:  1.25 mg Bevacizumab (AVASTIN) 1.25mg /0.5mL SOLN   NDC: H061816, Lot: [id number], Expiration date: 11/25/2020   Route: Intravitreal, Site: Right Eye, Waste: 0 mL  Post-op Post injection exam found visual acuity of at least counting fingers. The patient tolerated the procedure well. There were no complications. The patient received written and verbal post procedure care education. Post injection medications were not given.                 ASSESSMENT/PLAN:    ICD-10-CM   1. Central retinal vein occlusion with macular edema of right eye  H34.8110 Intravitreal Injection, Pharmacologic Agent - OD - Right  Eye    Bevacizumab (AVASTIN) SOLN 1.25 mg  2. Retinal edema  H35.81 OCT, Retina - OU - Both Eyes  3. Factor V Leiden ([place])  D68.51   4. Essential hypertension  I10   5. Hypertensive retinopathy of both eyes  H35.033   6. Pseudophakia of both eyes  Z96.1     1-3. CRVO with macular edema OD        History of Factor V  Leiden on Eliquis  - pt reports history of blood clots / DVTs  - significant CV history with A fib s/p ablation and pacemaker implantation             - s/p IVA OD # 1 (03.09.21), #2 (04.06.21), #3 (5.4.21), #4 (06.01.21), #5 (07.07.21), #6 (08.11.21), #7 (09.15.2021), #8 (10.20.21), #9 (11.17.21), #10 (12.20.21), #11 (1.24.22)  **history of recurrent CME at 5 wk interval (10.20.21)**  - FA (04.06.21) shows delayed filling time, hyperfluorescence of the disc, mild focal leakage along IT arcades, mild focal leakage perifovea  - OCT today shows interval improvement in SN edema and IRF -- just trace cystic changes remain, stable improvement in disc edema -- 4 wk interval  - BCVA stable at 20/40 today   - recommend IVA OD #12 today, 02.22.22  - pt wishes to proceed  - RBA of procedure discussed, questions answered  - IVA informed consent obtained and signed (OD)  - see procedure note  - return 4 week DFE, OCT, possible injection  4,5. Hypertensive retinopathy OU  - discussed importance of tight BP control  - monitor  6. Pseudophakia OU  - s/p CE/IOL OU w/ Dr. Valetta Close  - IOLs in good position   - monitor  Ophthalmic Meds Ordered this visit:  Meds ordered this encounter  Medications  . Bevacizumab (AVASTIN) SOLN 1.25 mg      Return in about 4 weeks (around 11/07/2020) for f/u CRVO OD, DFE, OCT.  There are no Patient Instructions on file for this visit.  This document serves as a record of services personally performed by Gardiner Sleeper, MD, PhD. It was created on their behalf by Estill Bakes, COT an ophthalmic technician. The creation of this record is the provider's dictation and/or activities during the visit.    Electronically signed by: Estill Bakes, COT 2.17.22 @ 4:28 PM   This document serves as a record of services personally performed by Gardiner Sleeper, MD, PhD. It was created on their behalf by San Jetty. Owens Shark, OA an ophthalmic technician. The creation of this record is the  provider's dictation and/or activities during the visit.    Electronically signed by: San Jetty. Owens Shark, [place] 02.22.2022 4:28 PM  Gardiner Sleeper, M.D., Ph.D. Diseases & Surgery of the Retina and [place] 10/10/2020   I have reviewed the above documentation for accuracy and completeness, and I agree with the above. Gardiner Sleeper, M.D., Ph.D. 10/10/20 4:28 PM    Abbreviations: M myopia (nearsighted); A astigmatism; H hyperopia (farsighted); P presbyopia; Mrx spectacle prescription;  CTL contact lenses; OD right eye; OS left eye; OU both eyes  XT exotropia; ET esotropia; PEK punctate epithelial keratitis; PEE punctate epithelial erosions; DES dry eye syndrome; MGD meibomian gland dysfunction; ATs artificial tears; PFAT's preservative free artificial tears; [place] nuclear sclerotic cataract; PSC posterior subcapsular cataract; ERM epi-retinal membrane; PVD posterior vitreous detachment; RD retinal detachment; DM diabetes mellitus; DR diabetic retinopathy; NPDR non-proliferative diabetic retinopathy; PDR proliferative diabetic retinopathy; CSME clinically significant  macular edema; DME diabetic macular edema; dbh dot blot hemorrhages; CWS cotton wool spot; POAG primary open angle glaucoma; C/D cup-to-disc ratio; HVF humphrey visual field; GVF goldmann visual field; OCT optical coherence tomography; IOP intraocular pressure; BRVO Branch retinal vein occlusion; CRVO central retinal vein occlusion; CRAO central retinal artery occlusion; BRAO branch retinal artery occlusion; RT retinal tear; SB scleral buckle; PPV pars plana vitrectomy; VH Vitreous hemorrhage; PRP panretinal laser photocoagulation; IVK intravitreal kenalog; VMT vitreomacular traction; MH Macular hole;  NVD neovascularization of the disc; NVE neovascularization elsewhere; AREDS age related eye disease study; ARMD age related macular degeneration; POAG primary open angle glaucoma; EBMD epithelial/anterior basement  membrane dystrophy; ACIOL anterior chamber intraocular lens; IOL intraocular lens; PCIOL posterior chamber intraocular lens; Phaco/IOL phacoemulsification with intraocular lens placement; [place] photorefractive keratectomy; LASIK laser assisted in situ keratomileusis; HTN hypertension; DM diabetes mellitus; COPD chronic obstructive pulmonary disease.

## 2020-10-09 ENCOUNTER — Encounter (INDEPENDENT_AMBULATORY_CARE_PROVIDER_SITE_OTHER): Payer: Medicare HMO | Admitting: Ophthalmology

## 2020-10-10 ENCOUNTER — Encounter (INDEPENDENT_AMBULATORY_CARE_PROVIDER_SITE_OTHER): Payer: Self-pay | Admitting: Ophthalmology

## 2020-10-10 ENCOUNTER — Ambulatory Visit (INDEPENDENT_AMBULATORY_CARE_PROVIDER_SITE_OTHER): Payer: Medicare HMO | Admitting: Ophthalmology

## 2020-10-10 ENCOUNTER — Other Ambulatory Visit: Payer: Self-pay

## 2020-10-10 DIAGNOSIS — H35033 Hypertensive retinopathy, bilateral: Secondary | ICD-10-CM | POA: Diagnosis not present

## 2020-10-10 DIAGNOSIS — I10 Essential (primary) hypertension: Secondary | ICD-10-CM

## 2020-10-10 DIAGNOSIS — Z961 Presence of intraocular lens: Secondary | ICD-10-CM | POA: Diagnosis not present

## 2020-10-10 DIAGNOSIS — D6851 Activated protein C resistance: Secondary | ICD-10-CM

## 2020-10-10 DIAGNOSIS — H34811 Central retinal vein occlusion, right eye, with macular edema: Secondary | ICD-10-CM

## 2020-10-10 DIAGNOSIS — H3581 Retinal edema: Secondary | ICD-10-CM

## 2020-10-10 MED ORDER — BEVACIZUMAB CHEMO INJECTION 1.25MG/0.05ML SYRINGE FOR KALEIDOSCOPE
1.2500 mg | INTRAVITREAL | Status: AC | PRN
  Administered 2020-10-10: 1.25 mg via INTRAVITREAL

## 2020-10-16 DIAGNOSIS — E039 Hypothyroidism, unspecified: Secondary | ICD-10-CM | POA: Diagnosis not present

## 2020-10-23 IMAGING — MR MR KNEE*L* W/O CM
4 of 7 series · 19 of 40 positions shown · non-contrast
Comparison: Plain films left knee 03/20/2020.

CLINICAL DATA: Left knee pain since a fall 3 weeks ago. Subsequent
encounter.

EXAM:
MRI OF THE LEFT KNEE WITHOUT CONTRAST
TECHNIQUE: Multiplanar, multisequence MR imaging of the knee was performed. No
intravenous contrast was administered.

[Series 2: T2 fat-sat · axial · 4.0mm · 0.33mm/px · z∈[-5,+94]mm · 3 of 25 slices shown]
[im 5/25]
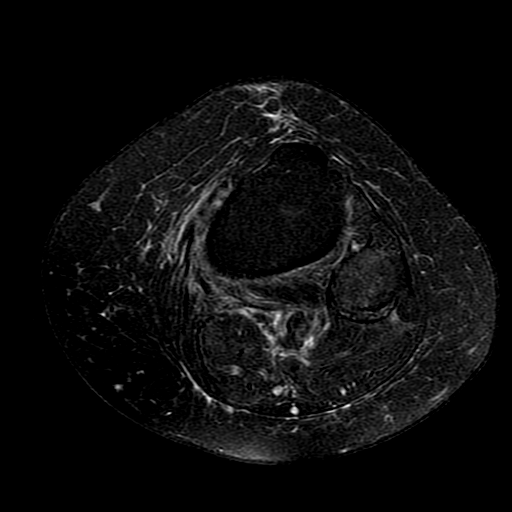
[im 15/25]
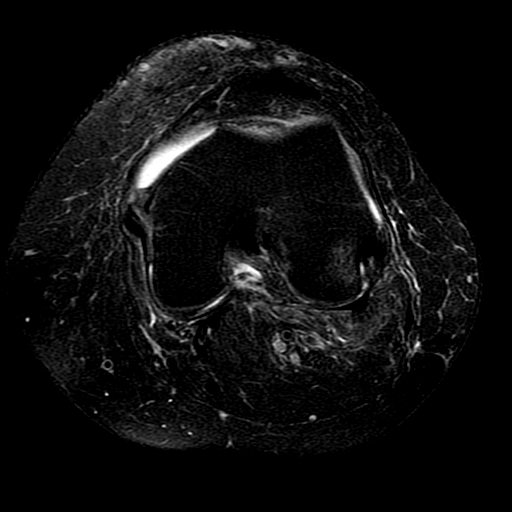
[im 25/25]
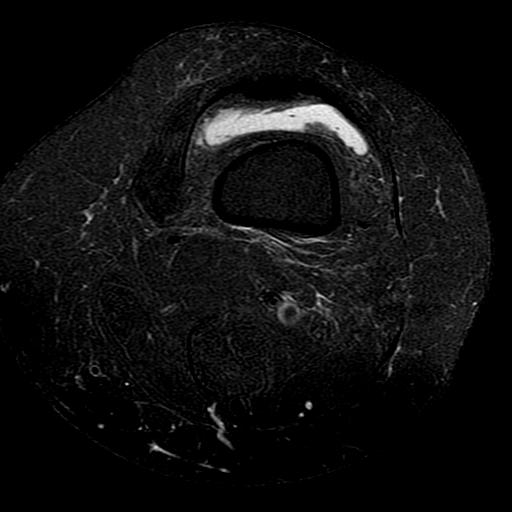

[Series 5: PD fat-sat · sagittal · 3.0mm · 0.31mm/px · 6 of 28 slices shown (1 of 2)]
[im 1/28]
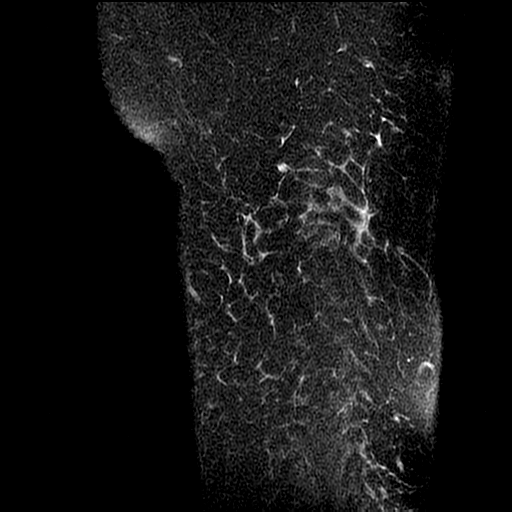
[im 6/28]
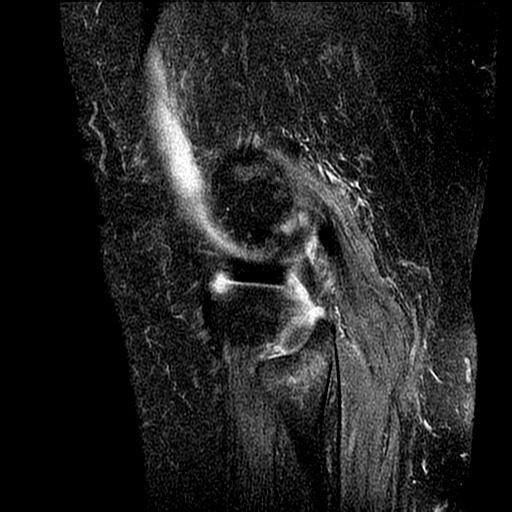
[im 11/28]
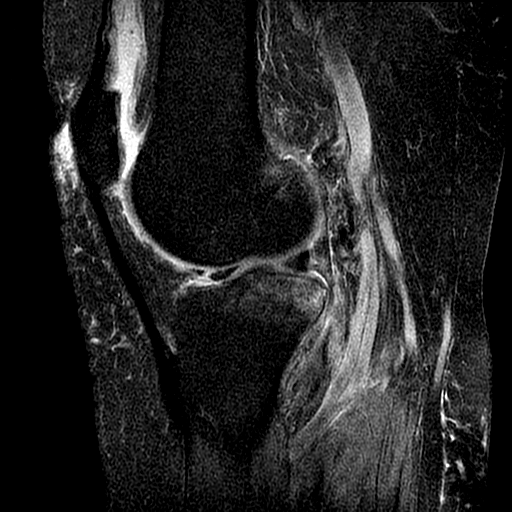
[im 17/28]
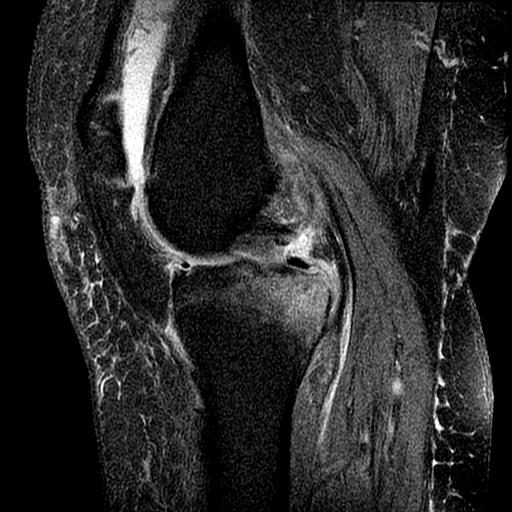
[im 22/28]
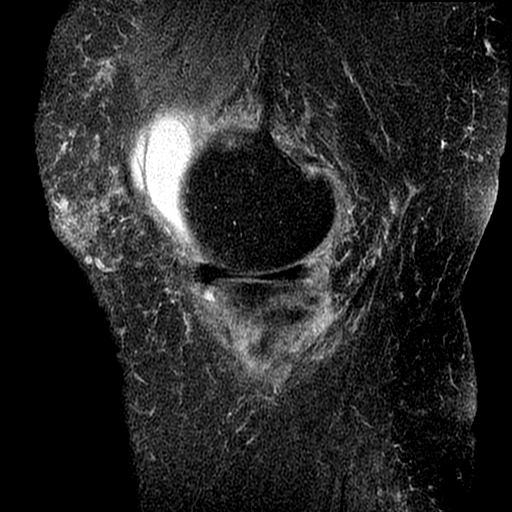
[im 28/28]
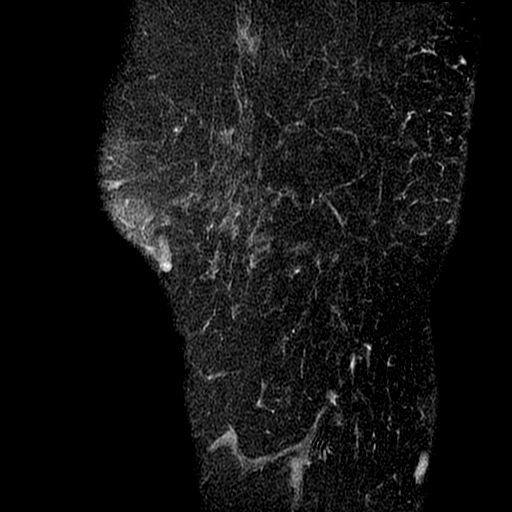

[Series 7: PD fat-sat · coronal · 3.0mm · 0.29mm/px · 7 of 33 slices shown (2 of 2)]
[im 1/33]
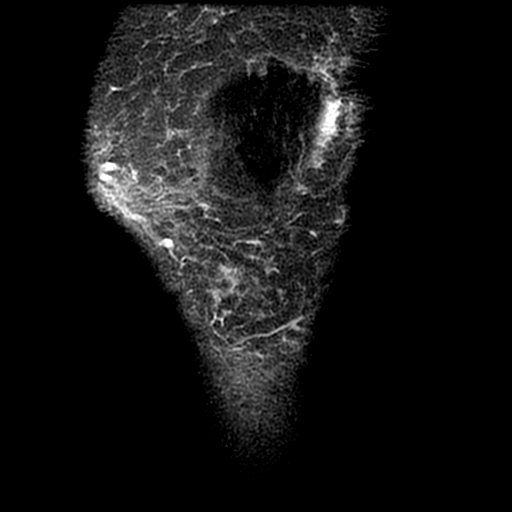
[im 6/33]
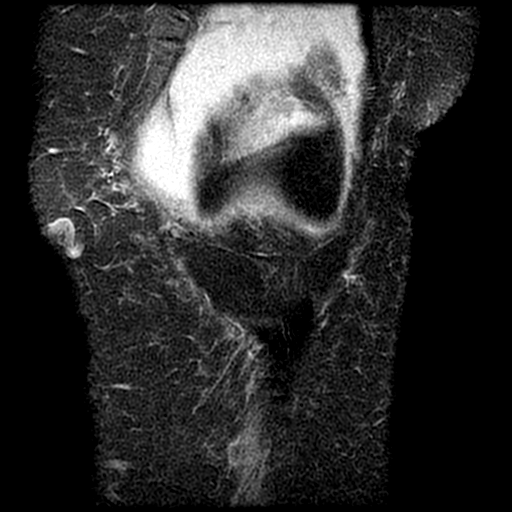
[im 11/33]
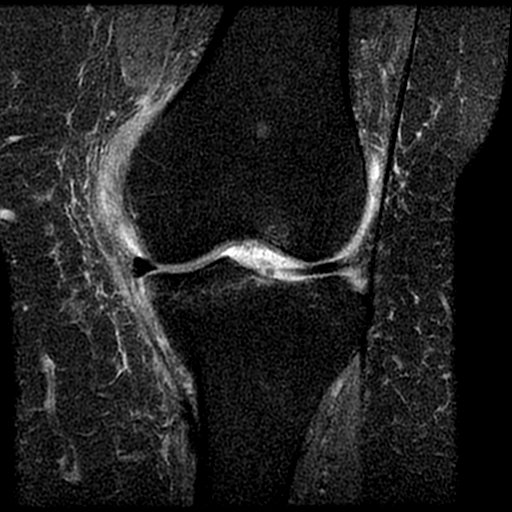
[im 17/33]
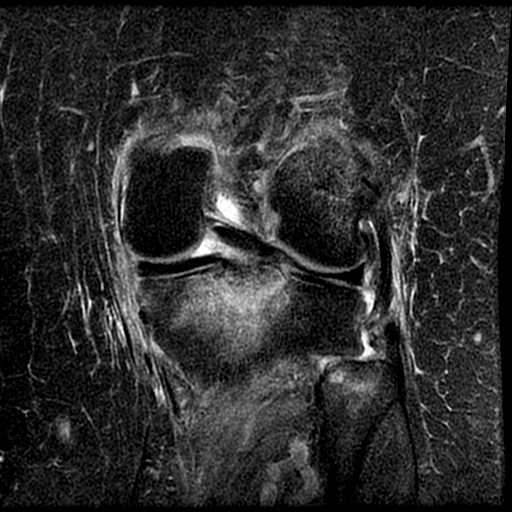
[im 22/33]
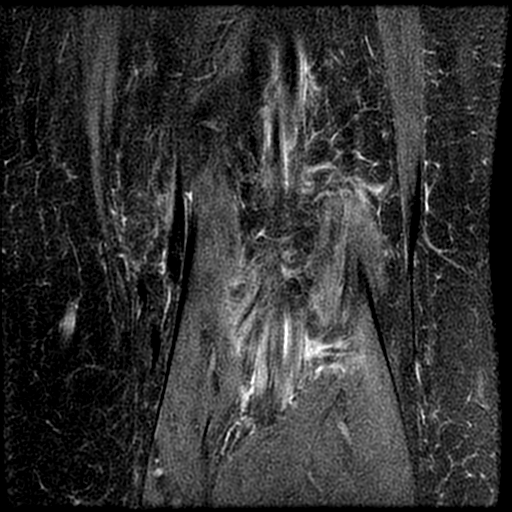
[im 27/33]
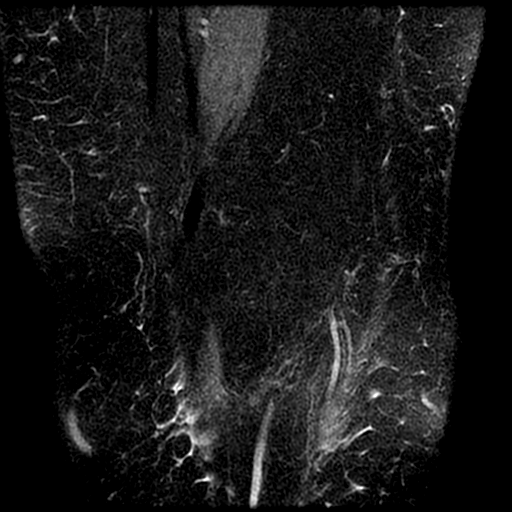
[im 33/33]
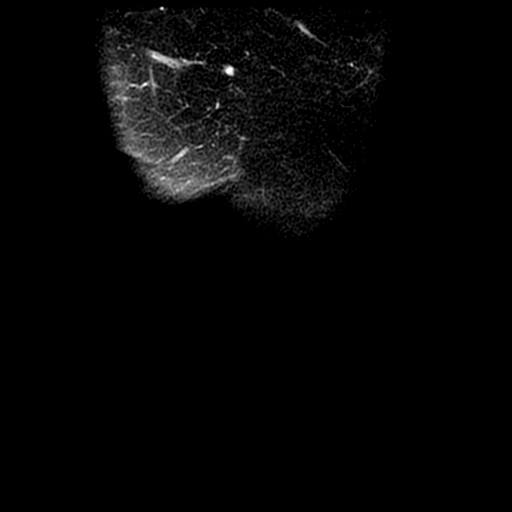

[Series 8: PD · coronal · 2.0mm · 0.29mm/px · 3 of 14 slices shown]
[im 1/14]
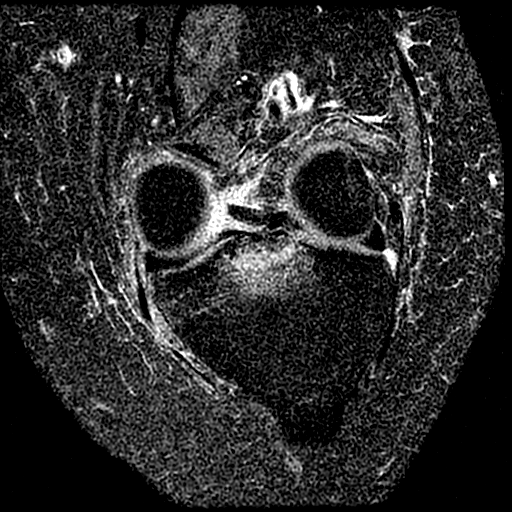
[im 7/14]
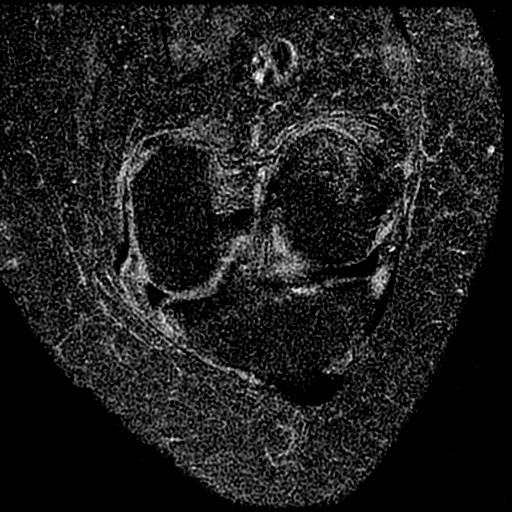
[im 14/14]
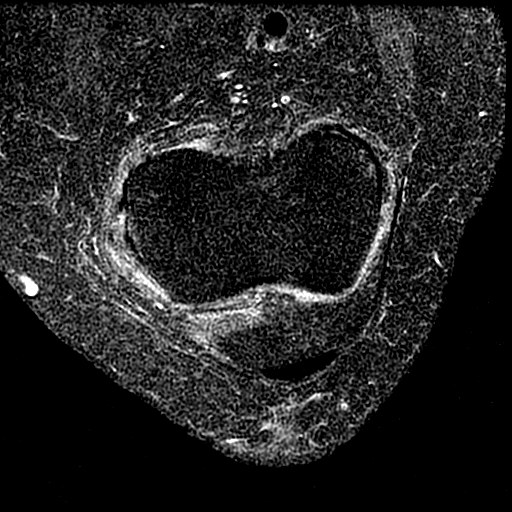

[19 of 40 positions shown; findings below may reference images not displayed]

FINDINGS: MENISCI

Medial meniscus:  Intact.

Lateral meniscus:  Intact.

LIGAMENTS

Cruciates:  Intact.

Collaterals: Intact. There is mild edema and intrasubstance
increased T2 signal in the medial collateral ligament consistent
with grade 2 sprain.

CARTILAGE

Patellofemoral:  Diffusely thinned without focal defect.

Medial:  Preserved.

Lateral:  Preserved.

Joint:  Small effusion.

Popliteal Fossa:  No Baker's cyst.

Extensor Mechanism:  Intact.

Bones: The patient has a nondisplaced fracture through the posterior
aspect of the tibia at the PCL attachment with associated marrow
edema. There is also a nondisplaced and incomplete fracture of the
fibular head. Bone contusions are seen in the posterior lateral
femoral condyle and central patella.

Other: None.
IMPRESSION: Findings consistent with an acute or early subacute through the
posterior aspect of the tibia at the PCL attachment. The patient
also has a nondisplaced and incomplete fracture of the head of the
fibula and bone contusions in the femur and patella.

Grade 2 MCL sprain without tear. The lateral collateral ligament
complex, menisci and cruciate ligaments are intact.

## 2020-10-25 DIAGNOSIS — I739 Peripheral vascular disease, unspecified: Secondary | ICD-10-CM | POA: Diagnosis not present

## 2020-10-25 DIAGNOSIS — L84 Corns and callosities: Secondary | ICD-10-CM | POA: Diagnosis not present

## 2020-10-25 DIAGNOSIS — L603 Nail dystrophy: Secondary | ICD-10-CM | POA: Diagnosis not present

## 2020-10-25 DIAGNOSIS — M79672 Pain in left foot: Secondary | ICD-10-CM | POA: Diagnosis not present

## 2020-10-25 DIAGNOSIS — M79671 Pain in right foot: Secondary | ICD-10-CM | POA: Diagnosis not present

## 2020-10-30 DIAGNOSIS — Z1389 Encounter for screening for other disorder: Secondary | ICD-10-CM | POA: Diagnosis not present

## 2020-10-30 DIAGNOSIS — Z0000 Encounter for general adult medical examination without abnormal findings: Secondary | ICD-10-CM | POA: Diagnosis not present

## 2020-11-06 DIAGNOSIS — E039 Hypothyroidism, unspecified: Secondary | ICD-10-CM | POA: Diagnosis not present

## 2020-11-06 DIAGNOSIS — D6851 Activated protein C resistance: Secondary | ICD-10-CM | POA: Diagnosis not present

## 2020-11-06 DIAGNOSIS — J449 Chronic obstructive pulmonary disease, unspecified: Secondary | ICD-10-CM | POA: Diagnosis not present

## 2020-11-06 DIAGNOSIS — R69 Illness, unspecified: Secondary | ICD-10-CM | POA: Diagnosis not present

## 2020-11-06 DIAGNOSIS — I482 Chronic atrial fibrillation, unspecified: Secondary | ICD-10-CM | POA: Diagnosis not present

## 2020-11-06 DIAGNOSIS — I10 Essential (primary) hypertension: Secondary | ICD-10-CM | POA: Diagnosis not present

## 2020-11-06 DIAGNOSIS — E7800 Pure hypercholesterolemia, unspecified: Secondary | ICD-10-CM | POA: Diagnosis not present

## 2020-11-06 DIAGNOSIS — Z7901 Long term (current) use of anticoagulants: Secondary | ICD-10-CM | POA: Diagnosis not present

## 2020-11-06 DIAGNOSIS — Z950 Presence of cardiac pacemaker: Secondary | ICD-10-CM | POA: Diagnosis not present

## 2020-11-06 DIAGNOSIS — G4733 Obstructive sleep apnea (adult) (pediatric): Secondary | ICD-10-CM | POA: Diagnosis not present

## 2020-11-06 NOTE — Progress Notes (Signed)
[facility] Note  11/07/2020     CHIEF COMPLAINT Patient presents for Retina Follow Up   HISTORY OF PRESENT ILLNESS: Carly Romero is a 75 y.o. female who presents to the clinic today for:   HPI    Retina Follow Up    Patient presents with  CRVO/BRVO.  In right eye.  This started 4 weeks ago.  I, the attending physician,  performed the HPI with the patient and updated documentation appropriately.          Comments    Patient here for 4 weeks retina follow up for CRVO OD. Patient states vision doing the same. No eye pain.        Last edited by Bernarda Caffey, MD on 11/07/2020  9:31 PM. (History)    Patient states vision the same OU  Referring physician: Hortencia Pilar, MD [place],  [place] [address]  HISTORICAL INFORMATION:   Selected notes from the MEDICAL RECORD NUMBER Referral from Dr. Kathlen Mody for eval of CRVO w/edema OD.   CURRENT MEDICATIONS: Current Outpatient Medications (Ophthalmic Drugs)  Medication Sig  . fluorometholone (FML) 0.1 % ophthalmic suspension  (Patient not taking: No sig reported)  . Polyethyl Glycol-Propyl Glycol (SYSTANE OP) Place 1 drop into both eyes daily as needed (dry eyes).    No current facility-administered medications for this visit. (Ophthalmic Drugs)   Current Outpatient Medications (Other)  Medication Sig  . albuterol (VENTOLIN HFA) 108 (90 Base) MCG/ACT inhaler Inhale 2 puffs into the lungs every 6 (six) hours as needed for wheezing or shortness of breath.  Marland Kitchen BIOTIN PO Take 1 tablet by mouth daily.  . Calcium Carbonate-Vitamin D (CALCIUM + D PO) Take 1 tablet by mouth daily.  Marland Kitchen diltiazem (CARDIZEM CD) 240 MG 24 hr capsule Take 1 capsule (240 mg total) by mouth daily.  Marland Kitchen ELIQUIS 5 MG TABS tablet Take 1 tablet (5 mg total) by mouth 2 (two) times daily.  Marland Kitchen FLUoxetine (PROZAC) 20 MG capsule Take 20 mg by mouth daily.  . Fluoxetine HCl, PMDD, 20 MG TABS Take by mouth.  . furosemide  (LASIX) 40 MG tablet Take 1 tablet (40 mg total) by mouth daily as needed for fluid or edema.  Marland Kitchen levothyroxine (SYNTHROID) 125 MCG tablet Take 125 mcg by mouth daily.  Marland Kitchen loratadine (CLARITIN) 10 MG tablet Take 10 mg by mouth daily as needed for allergies.   Marland Kitchen losartan (COZAAR) 50 MG tablet Take 1 tablet (50 mg total) by mouth daily.  . metoprolol tartrate (LOPRESSOR) 50 MG tablet Take 0.5 tablets (25 mg total) by mouth 2 (two) times daily.  . Multiple Vitamin (MULTIVITAMIN WITH MINERALS) TABS tablet Take 1 tablet by mouth daily.  . potassium chloride (KLOR-CON) 10 MEQ tablet Take by mouth.   . potassium chloride (KLOR-CON) 10 MEQ tablet Take 1 tablet (10 mEq total) by mouth daily. Please keep upcoming appt in July with Dr. Lovena Le before anymore refills. Thank you  . rosuvastatin (CRESTOR) 40 MG tablet Take 40 mg by mouth daily.  Marland Kitchen SHINGRIX injection   . Tiotropium Bromide-Olodaterol (STIOLTO RESPIMAT) 2.5-2.5 MCG/ACT AERS Inhale 2 puffs into the lungs daily.   No current facility-administered medications for this visit. (Other)      REVIEW OF SYSTEMS: ROS    Positive for: Musculoskeletal, Endocrine, Cardiovascular, Eyes, Respiratory   Negative for: Constitutional, Gastrointestinal, Neurological, Skin, Genitourinary, HENT, Psychiatric, Allergic/Imm, Heme/Lymph   Last edited by [name] Demark, COA on  11/07/2020  2:23 PM. (History)       ALLERGIES Allergies  Allergen Reactions  . Amiodarone Other (See Comments)    alopecia  . Chantix [Varenicline]     Suicidal thoughts  . Codeine Swelling    Swelling as a young adult  . Lipitor [Atorvastatin] Diarrhea    And cramping.    PAST MEDICAL HISTORY Past Medical History:  Diagnosis Date  . Atrial fibrillation ([place])   . Factor V Leiden ([place])   . Hyperlipidemia   . Hypertension   . Hypertensive heart disease   . Hypertensive retinopathy    OU  . Hypothyroidism   . Macular degeneration    Dry OU  . Obesity (BMI 30-39.9)   .  Thyroid disease    Past Surgical History:  Procedure Laterality Date  . ATRIAL FIBRILLATION ABLATION  12/2014   Dr Glennon Mac at [facility]  . ATRIAL FIBRILLATION ABLATION  08/2015   Dr Glennon Mac at [facility]  . CARDIOVERSION N/A 05/26/2014   Procedure: CARDIOVERSION;  Surgeon: Jacolyn Reedy, MD;  Location[facility] ENDOSCOPY;  Service: Cardiovascular;  Laterality: N/A;  . CARDIOVERSION N/A 09/14/2014   Procedure: CARDIOVERSION;  Surgeon: Jacolyn Reedy, MD;  Location: [facility] ENDOSCOPY;  Service: Cardiovascular;  Laterality: N/A;  . CARDIOVERSION N/A 05/11/2015   Procedure: CARDIOVERSION;  Surgeon: Jacolyn Reedy, MD;  Location: [place];  Service: Cardiovascular;  Laterality: N/A;  . CATARACT EXTRACTION Bilateral   . COSMETIC SURGERY     lower lids  . DENTAL SURGERY     wisdom teeth  . EYE SURGERY Bilateral    Cat Sx OU  . PACEMAKER IMPLANT N/A 02/26/2018   Procedure: PACEMAKER IMPLANT;  Surgeon: Evans Lance, MD;  Location: [place] [facility];  Service: Cardiovascular;  Laterality: N/A;    FAMILY HISTORY Family History  Problem Relation Age of Onset  . Diabetes Brother     SOCIAL HISTORY Social History   Tobacco Use  . Smoking status: Former Smoker    Packs/day: 1.00    Years: 40.00    Pack years: 40.00    Quit date: 12/11/2013    Years since quitting: 6.9  . Smokeless tobacco: Never Used  Substance Use Topics  . Alcohol use: Yes    Alcohol/week: 8.0 standard drinks    Types: 8 Glasses of wine per week  . Drug use: No         OPHTHALMIC EXAM:  Base Eye Exam    Visual Acuity (Snellen - Linear)      Right Left   Dist cc 20/40 +1 20/20   Dist ph cc NI    Correction: Glasses       Tonometry (Tonopen, 2:20 PM)      Right Left   Pressure 12 13       Pupils      Dark Light Shape React APD   Right 3 2 Round Brisk None   Left 3 2 Round Brisk None       Visual Fields (Counting fingers)      Left Right    Full Full       Extraocular Movement      Right Left     Full Full       Neuro/Psych    Oriented x3: Yes   Mood/Affect: Normal       Dilation    Both eyes: 1.0% Mydriacyl, 2.5% Phenylephrine @ 2:20 PM        Slit Lamp and Fundus Exam  Slit Lamp Exam      Right Left   Lids/Lashes Dermatochalasis - upper lid Dermatochalasis - upper lid   Conjunctiva/Sclera White and quiet White and quiet   Cornea Trace PEE, mild arcus, well healed temporal cataract wound, EBMD 1+ PEE, mild arcus, well healed temporal cataract wound   Anterior Chamber deep and clear deep and clear   Iris round and dilated round and dilated   Lens PC IOL in perfect position, open PC PC IOL in perfect position, open PC   Vitreous syneresis syneresis       Fundus Exam      Right Left   Disc Optic disc edema, hyperemia, vascular loops, focal PPP temporal compact, mild tilt, pink and sharp   C/D Ratio 0.2 0.4   Macula Good foveal reflex, trace cystic changes - slightly increased, rare MA flat, good foveal reflex, mild RPE mottling and clumping, trace drusen, no edema, rare IRH   Vessels Dilated and Tortuous, +CRVO attenuated, tortuous, mild A/V crossing changes   Periphery Attached, scattered IRH - improved, mild reticular degeneration attached, mild reticular degeneration        Refraction    Wearing Rx      Sphere Cylinder Add   Right -0.75 Sphere +2.50   Left -0.50 Sphere +2.50   Type: PAL          IMAGING AND PROCEDURES  Imaging and Procedures for @TODAY @  OCT, Retina - OU - Both Eyes       Right Eye Quality was good. Central Foveal Thickness: 267. Progression has worsened. Findings include intraretinal fluid, normal foveal contour, no SRF (Mild interval increase in cystic changes inferior fovea, stable improvement in disc edema).   Left Eye Quality was good. Central Foveal Thickness: 251. Progression has been stable. Findings include normal foveal contour, no IRF, no SRF, retinal drusen  (Trace drusen).   Notes *Images captured and stored on  drive  Diagnosis / Impression:  OD: Interval increase in cystic changes inferior fovea, stable improvement in disc edema OS: NFP, no IRF/SRF; Trace drusen  Clinical management:  See below  Abbreviations: NFP - Normal foveal profile. CME - cystoid macular edema. PED - pigment epithelial detachment. IRF - intraretinal fluid. SRF - subretinal fluid. EZ - ellipsoid zone. ERM - epiretinal membrane. ORA - outer retinal atrophy. ORT - outer retinal tubulation. SRHM - subretinal hyper-reflective material        Intravitreal Injection, Pharmacologic Agent - OD - Right Eye       Time Out 11/07/2020. 3:08 PM. Confirmed correct patient, procedure, site, and patient consented.   Anesthesia Topical anesthesia was used. Anesthetic medications included Lidocaine 2%, Proparacaine 0.5%.   Procedure Preparation included 5% betadine to ocular surface, eyelid speculum. A (32g) needle was used.   Injection:  1.25 mg Bevacizumab (AVASTIN) 1.25mg /0.30mL SOLN   NDC: 76195-093-26, Lot: [id number], Expiration date: 12/26/2020   Route: Intravitreal, Site: Right Eye, Waste: 0.05 mL  Post-op Post injection exam found visual acuity of at least counting fingers. The patient tolerated the procedure well. There were no complications. The patient received written and verbal post procedure care education. Post injection medications were not given.                 ASSESSMENT/PLAN:    ICD-10-CM   1. Central retinal vein occlusion with macular edema of right eye  H34.8110 Intravitreal Injection, Pharmacologic Agent - OD - Right Eye    Bevacizumab (AVASTIN) SOLN 1.25 mg  2. Retinal edema  H35.81 OCT, Retina - OU - Both Eyes  3. Factor V Leiden ([place])  D68.51   4. Essential hypertension  I10   5. Hypertensive retinopathy of both eyes  H35.033   6. Pseudophakia of both eyes  Z96.1     1-3. CRVO with macular edema OD        History of Factor V Leiden on Eliquis  - pt reports history of blood clots / DVTs  -  significant CV history with A fib s/p ablation and pacemaker implantation             - s/p IVA OD # 1 (03.09.21), #2 (04.06.21), #3 (5.4.21), #4 (06.01.21), #5 (07.07.21), #6 (08.11.21), #7 (09.15.2021), #8 (10.20.21), #9 (11.17.21), #10 (12.20.21), #11 (1.24.22), #12 (2.22.22)  **history of recurrent CME at 5 wk interval (10.20.21)**  - FA (04.06.21) shows delayed filling time, hyperfluorescence of the disc, mild focal leakage along IT arcades, mild focal leakage perifovea  - OCT today shows interval increase in cystic changes inferior fovea, stable improvement in disc edema -- 4 wk interval  - BCVA stable at 20/40 today   - recommend IVA OD #13 today, 03.22.22  - pt wishes to proceed  - RBA of procedure discussed, questions answered  - IVA informed consent obtained and signed (OD)  - see procedure note  - return 4 week DFE, OCT, possible injection  4,5. Hypertensive retinopathy OU  - discussed importance of tight BP control  - monitor  6. Pseudophakia OU  - s/p CE/IOL OU w/ Dr. Valetta Close  - IOLs in good position   - monitor  Ophthalmic Meds Ordered this visit:  Meds ordered this encounter  Medications  . Bevacizumab (AVASTIN) SOLN 1.25 mg      Return in about 4 weeks (around 12/05/2020) for DFE, OCT.  There are no Patient Instructions on file for this visit.  This document serves as a record of services personally performed by Gardiner Sleeper, MD, PhD. It was created on their behalf by Estill Bakes, COT an ophthalmic technician. The creation of this record is the provider's dictation and/or activities during the visit.    Electronically signed by: Estill Bakes, COT 3.21.22 @ 9:33 PM  This document serves as a record of services personally performed by Gardiner Sleeper, MD, PhD. It was created on their behalf by Roselee Nova, COMT. The creation of this record is the provider's dictation and/or activities during the visit.  Electronically signed by: Roselee Nova, COMT 11/07/20  9:33 PM  Gardiner Sleeper, M.D., Ph.D. Diseases & Surgery of the Retina and [place] 11/07/2020   I have reviewed the above documentation for accuracy and completeness, and I agree with the above. Gardiner Sleeper, M.D., Ph.D. 11/07/20 9:33 PM  Abbreviations: M myopia (nearsighted); A astigmatism; H hyperopia (farsighted); P presbyopia; Mrx spectacle prescription;  CTL contact lenses; OD right eye; OS left eye; OU both eyes  XT exotropia; ET esotropia; PEK punctate epithelial keratitis; PEE punctate epithelial erosions; DES dry eye syndrome; MGD meibomian gland dysfunction; ATs artificial tears; PFAT's preservative free artificial tears; [place] nuclear sclerotic cataract; PSC posterior subcapsular cataract; ERM epi-retinal membrane; PVD posterior vitreous detachment; RD retinal detachment; DM diabetes mellitus; DR diabetic retinopathy; NPDR non-proliferative diabetic retinopathy; PDR proliferative diabetic retinopathy; CSME clinically significant macular edema; DME diabetic macular edema; dbh dot blot hemorrhages; CWS cotton wool spot; POAG primary open angle glaucoma; C/D cup-to-disc ratio; HVF humphrey visual field; GVF goldmann visual field; OCT  optical coherence tomography; IOP intraocular pressure; BRVO Branch retinal vein occlusion; CRVO central retinal vein occlusion; CRAO central retinal artery occlusion; BRAO branch retinal artery occlusion; RT retinal tear; SB scleral buckle; PPV pars plana vitrectomy; VH Vitreous hemorrhage; PRP panretinal laser photocoagulation; IVK intravitreal kenalog; VMT vitreomacular traction; MH Macular hole;  NVD neovascularization of the disc; NVE neovascularization elsewhere; AREDS age related eye disease study; ARMD age related macular degeneration; POAG primary open angle glaucoma; EBMD epithelial/anterior basement membrane dystrophy; ACIOL anterior chamber intraocular lens; IOL intraocular lens; PCIOL posterior chamber intraocular lens;  Phaco/IOL phacoemulsification with intraocular lens placement; [place] photorefractive keratectomy; LASIK laser assisted in situ keratomileusis; HTN hypertension; DM diabetes mellitus; COPD chronic obstructive pulmonary disease.

## 2020-11-07 ENCOUNTER — Other Ambulatory Visit: Payer: Self-pay

## 2020-11-07 ENCOUNTER — Ambulatory Visit (INDEPENDENT_AMBULATORY_CARE_PROVIDER_SITE_OTHER): Payer: Medicare HMO | Admitting: Ophthalmology

## 2020-11-07 ENCOUNTER — Encounter (INDEPENDENT_AMBULATORY_CARE_PROVIDER_SITE_OTHER): Payer: Self-pay | Admitting: Ophthalmology

## 2020-11-07 DIAGNOSIS — H34811 Central retinal vein occlusion, right eye, with macular edema: Secondary | ICD-10-CM

## 2020-11-07 DIAGNOSIS — Z961 Presence of intraocular lens: Secondary | ICD-10-CM | POA: Diagnosis not present

## 2020-11-07 DIAGNOSIS — D6851 Activated protein C resistance: Secondary | ICD-10-CM

## 2020-11-07 DIAGNOSIS — I10 Essential (primary) hypertension: Secondary | ICD-10-CM | POA: Diagnosis not present

## 2020-11-07 DIAGNOSIS — H35033 Hypertensive retinopathy, bilateral: Secondary | ICD-10-CM | POA: Diagnosis not present

## 2020-11-07 DIAGNOSIS — H3581 Retinal edema: Secondary | ICD-10-CM

## 2020-11-07 MED ORDER — BEVACIZUMAB CHEMO INJECTION 1.25MG/0.05ML SYRINGE FOR KALEIDOSCOPE
1.2500 mg | INTRAVITREAL | Status: AC | PRN
  Administered 2020-11-07: 1.25 mg via INTRAVITREAL

## 2020-11-14 DIAGNOSIS — H34811 Central retinal vein occlusion, right eye, with macular edema: Secondary | ICD-10-CM | POA: Diagnosis not present

## 2020-11-14 DIAGNOSIS — H04123 Dry eye syndrome of bilateral lacrimal glands: Secondary | ICD-10-CM | POA: Diagnosis not present

## 2020-11-14 DIAGNOSIS — H524 Presbyopia: Secondary | ICD-10-CM | POA: Diagnosis not present

## 2020-11-14 DIAGNOSIS — H353131 Nonexudative age-related macular degeneration, bilateral, early dry stage: Secondary | ICD-10-CM | POA: Diagnosis not present

## 2020-12-04 NOTE — Progress Notes (Signed)
[facility] Note  12/05/2020     CHIEF COMPLAINT Patient presents for Retina Follow Up   HISTORY OF PRESENT ILLNESS: Carly Romero is a 75 y.o. female who presents to the clinic today for:   HPI    Retina Follow Up    Patient presents with  CRVO/BRVO.  In right eye.  This started 4 weeks ago.  I, the attending physician,  performed the HPI with the patient and updated documentation appropriately.          Comments    Patient here for 4 weeks retina follow up for DFE. Patient states vision doing the same. No eye pain.        Last edited by Bernarda Caffey, MD on 12/05/2020 11:34 PM. (History)    pt states vision is stable, no new health concerns  Referring physician: Hortencia Pilar, MD [place],  [place] [address]  HISTORICAL INFORMATION:   Selected notes from the MEDICAL RECORD NUMBER Referral from Dr. Kathlen Mody for eval of CRVO w/edema OD.   CURRENT MEDICATIONS: Current Outpatient Medications (Ophthalmic Drugs)  Medication Sig  . fluorometholone (FML) 0.1 % ophthalmic suspension  (Patient not taking: No sig reported)  . Polyethyl Glycol-Propyl Glycol (SYSTANE OP) Place 1 drop into both eyes daily as needed (dry eyes).    No current facility-administered medications for this visit. (Ophthalmic Drugs)   Current Outpatient Medications (Other)  Medication Sig  . albuterol (VENTOLIN HFA) 108 (90 Base) MCG/ACT inhaler Inhale 2 puffs into the lungs every 6 (six) hours as needed for wheezing or shortness of breath.  Marland Kitchen BIOTIN PO Take 1 tablet by mouth daily.  . Calcium Carbonate-Vitamin D (CALCIUM + D PO) Take 1 tablet by mouth daily.  Marland Kitchen diltiazem (CARDIZEM CD) 240 MG 24 hr capsule Take 1 capsule (240 mg total) by mouth daily.  Marland Kitchen ELIQUIS 5 MG TABS tablet Take 1 tablet (5 mg total) by mouth 2 (two) times daily.  Marland Kitchen FLUoxetine (PROZAC) 20 MG capsule Take 20 mg by mouth daily.  . Fluoxetine HCl, PMDD, 20 MG TABS Take by mouth.   . furosemide (LASIX) 40 MG tablet Take 1 tablet (40 mg total) by mouth daily as needed for fluid or edema.  Marland Kitchen levothyroxine (SYNTHROID) 125 MCG tablet Take 125 mcg by mouth daily.  Marland Kitchen loratadine (CLARITIN) 10 MG tablet Take 10 mg by mouth daily as needed for allergies.   Marland Kitchen losartan (COZAAR) 50 MG tablet Take 1 tablet (50 mg total) by mouth daily.  . metoprolol tartrate (LOPRESSOR) 50 MG tablet Take 0.5 tablets (25 mg total) by mouth 2 (two) times daily.  . Multiple Vitamin (MULTIVITAMIN WITH MINERALS) TABS tablet Take 1 tablet by mouth daily.  . potassium chloride (KLOR-CON) 10 MEQ tablet Take by mouth.   . potassium chloride (KLOR-CON) 10 MEQ tablet Take 1 tablet (10 mEq total) by mouth daily. Please keep upcoming appt in July with Dr. Lovena Le before anymore refills. Thank you  . rosuvastatin (CRESTOR) 40 MG tablet Take 40 mg by mouth daily.  Marland Kitchen SHINGRIX injection   . Tiotropium Bromide-Olodaterol (STIOLTO RESPIMAT) 2.5-2.5 MCG/ACT AERS Inhale 2 puffs into the lungs daily.   No current facility-administered medications for this visit. (Other)      REVIEW OF SYSTEMS: ROS    Positive for: Musculoskeletal, Endocrine, Cardiovascular, Eyes, Respiratory   Negative for: Constitutional, Gastrointestinal, Neurological, Skin, Genitourinary, HENT, Psychiatric, Allergic/Imm, Heme/Lymph   Last edited by [name] Demark, COA  on 12/05/2020  2:28 PM. (History)       ALLERGIES Allergies  Allergen Reactions  . Amiodarone Other (See Comments)    alopecia  . Chantix [Varenicline]     Suicidal thoughts  . Codeine Swelling    Swelling as a young adult  . Lipitor [Atorvastatin] Diarrhea    And cramping.    PAST MEDICAL HISTORY Past Medical History:  Diagnosis Date  . Atrial fibrillation ([place])   . Factor V Leiden ([place])   . Hyperlipidemia   . Hypertension   . Hypertensive heart disease   . Hypertensive retinopathy    OU  . Hypothyroidism   . Macular degeneration    Dry OU  . Obesity (BMI  30-39.9)   . Thyroid disease    Past Surgical History:  Procedure Laterality Date  . ATRIAL FIBRILLATION ABLATION  12/2014   Dr Glennon Mac at [facility]  . ATRIAL FIBRILLATION ABLATION  08/2015   Dr Glennon Mac at [facility]  . CARDIOVERSION N/A 05/26/2014   Procedure: CARDIOVERSION;  Surgeon: Jacolyn Reedy, MD;  Location: [facility] ENDOSCOPY;  Service: Cardiovascular;  Laterality: N/A;  . CARDIOVERSION N/A 09/14/2014   Procedure: CARDIOVERSION;  Surgeon: Jacolyn Reedy, MD;  Location: [facility] ENDOSCOPY;  Service: Cardiovascular;  Laterality: N/A;  . CARDIOVERSION N/A 05/11/2015   Procedure: CARDIOVERSION;  Surgeon: Jacolyn Reedy, MD;  Location: [facility];  Service: Cardiovascular;  Laterality: N/A;  . CATARACT EXTRACTION Bilateral   . COSMETIC SURGERY     lower lids  . DENTAL SURGERY     wisdom teeth  . EYE SURGERY Bilateral    Cat Sx OU  . PACEMAKER IMPLANT N/A 02/26/2018   Procedure: PACEMAKER IMPLANT;  Surgeon: Evans Lance, MD;  Location: [facility];  Service: Cardiovascular;  Laterality: N/A;    FAMILY HISTORY Family History  Problem Relation Age of Onset  . Diabetes Brother     SOCIAL HISTORY Social History   Tobacco Use  . Smoking status: Former Smoker    Packs/day: 1.00    Years: 40.00    Pack years: 40.00    Quit date: 12/11/2013    Years since quitting: 6.9  . Smokeless tobacco: Never Used  Substance Use Topics  . Alcohol use: Yes    Alcohol/week: 8.0 standard drinks    Types: 8 Glasses of wine per week  . Drug use: No         OPHTHALMIC EXAM:  Base Eye Exam    Visual Acuity (Snellen - Linear)      Right Left   Dist cc 20/30 -2 20/20   Dist ph cc NI    Correction: Glasses       Tonometry (Tonopen, 2:26 PM)      Right Left   Pressure 16 10       Pupils      Dark Light Shape React APD   Right 3 2 Round Brisk None   Left 3 2 Round Brisk None       Visual Fields (Counting fingers)      Left Right    Full Full       Extraocular Movement       Right Left    Full Full       Neuro/Psych    Oriented x3: Yes   Mood/Affect: Normal       Dilation    Both eyes: 1.0% Mydriacyl, 2.5% Phenylephrine @ 2:26 PM        Slit Lamp and Fundus  Exam    Slit Lamp Exam      Right Left   Lids/Lashes Dermatochalasis - upper lid Dermatochalasis - upper lid   Conjunctiva/Sclera White and quiet White and quiet   Cornea Trace PEE, mild arcus, well healed temporal cataract wound, EBMD 1+ PEE, mild arcus, well healed temporal cataract wound   Anterior Chamber deep and clear deep and clear   Iris round and dilated round and dilated   Lens PC IOL in perfect position, open PC PC IOL in perfect position, open PC   Vitreous syneresis syneresis, Posterior vitreous detachment       Fundus Exam      Right Left   Disc Optic disc edema, hyperemia, vascular loops, focal PPP temporal, Compact compact, mild tilt, pink and sharp   C/D Ratio 0.2 0.4   Macula Good foveal reflex, trace cystic changes - improved, rare MA flat, good foveal reflex, mild RPE mottling and clumping, trace drusen, no edema, rare IRH/DBH   Vessels Dilated and Tortuous, +CRVO attenuated, tortuous, mild A/V crossing changes   Periphery Attached, scattered IRH - improved, mild reticular degeneration attached, mild reticular degeneration        Refraction    Wearing Rx      Sphere Cylinder Add   Right -0.75 Sphere +2.50   Left -0.50 Sphere +2.50   Type: PAL          IMAGING AND PROCEDURES  Imaging and Procedures for @TODAY @  OCT, Retina - OU - Both Eyes       Right Eye Quality was good. Central Foveal Thickness: 262. Progression has improved. Findings include normal foveal contour, no SRF, no IRF (Interval improvement in IRF, just trace cystic changes remain, stable improvement in disc edema).   Left Eye Quality was good. Central Foveal Thickness: 253. Progression has been stable. Findings include normal foveal contour, no IRF, no SRF, retinal drusen  (Trace drusen).    Notes *Images captured and stored on drive  Diagnosis / Impression:  OD: Interval improvement in IRF, just trace cystic changes remain, stable improvement in disc edema OS: NFP, no IRF/SRF; Trace drusen  Clinical management:  See below  Abbreviations: NFP - Normal foveal profile. CME - cystoid macular edema. PED - pigment epithelial detachment. IRF - intraretinal fluid. SRF - subretinal fluid. EZ - ellipsoid zone. ERM - epiretinal membrane. ORA - outer retinal atrophy. ORT - outer retinal tubulation. SRHM - subretinal hyper-reflective material        Intravitreal Injection, Pharmacologic Agent - OD - Right Eye       Time Out 12/05/2020. 2:33 PM. Confirmed correct patient, procedure, site, and patient consented.   Anesthesia Topical anesthesia was used. Anesthetic medications included Lidocaine 2%, Proparacaine 0.5%.   Procedure Preparation included 5% betadine to ocular surface, eyelid speculum. A (32g) needle was used.   Injection:  1.25 mg Bevacizumab (AVASTIN) 1.25mg /0.25mL SOLN   NDC: 14782-956-21, Lot: [id number], Expiration date: 01/07/2021   Route: Intravitreal, Site: Right Eye, Waste: 0.05 mL  Post-op Post injection exam found visual acuity of at least counting fingers. The patient tolerated the procedure well. There were no complications. The patient received written and verbal post procedure care education. Post injection medications were not given.                 ASSESSMENT/PLAN:    ICD-10-CM   1. Central retinal vein occlusion with macular edema of right eye  H34.8110 Intravitreal Injection, Pharmacologic Agent - OD - Right Eye  Bevacizumab (AVASTIN) SOLN 1.25 mg  2. Retinal edema  H35.81 OCT, Retina - OU - Both Eyes  3. Factor V Leiden ([place])  D68.51   4. Essential hypertension  I10   5. Hypertensive retinopathy of both eyes  H35.033   6. Pseudophakia of both eyes  Z96.1     1-3. CRVO with macular edema OD        History of Factor V Leiden on  Eliquis  - pt reports history of blood clots / DVTs  - significant CV history with A fib s/p ablation and pacemaker implantation             - s/p IVA OD # 1 (03.09.21), #2 (04.06.21), #3 (5.4.21), #4 (06.01.21), #5 (07.07.21), #6 (08.11.21), #7 (09.15.2021), #8 (10.20.21), #9 (11.17.21), #10 (12.20.21), #11 (1.24.22), #12 (2.22.22), #13 (3.22.22)  **history of recurrent CME at 5 wk interval (10.20.21)**  - FA (04.06.21) shows delayed filling time, hyperfluorescence of the disc, mild focal leakage along IT arcades, mild focal leakage perifovea  - OCT today shows Interval improvement in IRF, just trace cystic changes remain, stable improvement in disc edema  - BCVA stable at 20/30 today   - recommend IVA OD #14 today, 04.19.22  - pt wishes to proceed  - RBA of procedure discussed, questions answered  - IVA informed consent obtained and signed (OD)  - see procedure note  - return 4-5 week DFE, OCT, possible injection  4,5. Hypertensive retinopathy OU  - discussed importance of tight BP control  - monitor  6. Pseudophakia OU  - s/p CE/IOL OU w/ Dr. Valetta Close  - IOLs in good position   - monitor  Ophthalmic Meds Ordered this visit:  Meds ordered this encounter  Medications  . Bevacizumab (AVASTIN) SOLN 1.25 mg      Return for f/u 4-5 weeks, CRVO OD, DFE, OCT.  There are no Patient Instructions on file for this visit.  This document serves as a record of services personally performed by Gardiner Sleeper, MD, PhD. It was created on their behalf by Estill Bakes, COT an ophthalmic technician. The creation of this record is the provider's dictation and/or activities during the visit.    Electronically signed by: Estill Bakes, COT 4.18.22 @ 11:42 PM   This document serves as a record of services personally performed by Gardiner Sleeper, MD, PhD. It was created on their behalf by San Jetty. Owens Shark, OA an ophthalmic technician. The creation of this record is the provider's dictation and/or  activities during the visit.    Electronically signed by: San Jetty. Marguerita Merles 04.19.2022 11:42 PM  Gardiner Sleeper, M.D., Ph.D. Diseases & Surgery of the Retina and [place] 12/05/2020   I have reviewed the above documentation for accuracy and completeness, and I agree with the above. Gardiner Sleeper, M.D., Ph.D. 12/05/20 11:42 PM   Abbreviations: M myopia (nearsighted); A astigmatism; H hyperopia (farsighted); P presbyopia; Mrx spectacle prescription;  CTL contact lenses; OD right eye; OS left eye; OU both eyes  XT exotropia; ET esotropia; PEK punctate epithelial keratitis; PEE punctate epithelial erosions; DES dry eye syndrome; MGD meibomian gland dysfunction; ATs artificial tears; PFAT's preservative free artificial tears; [place] nuclear sclerotic cataract; PSC posterior subcapsular cataract; ERM epi-retinal membrane; PVD posterior vitreous detachment; RD retinal detachment; DM diabetes mellitus; DR diabetic retinopathy; NPDR non-proliferative diabetic retinopathy; PDR proliferative diabetic retinopathy; CSME clinically significant macular edema; DME diabetic macular edema; dbh dot blot hemorrhages; CWS cotton wool  spot; POAG primary open angle glaucoma; C/D cup-to-disc ratio; HVF humphrey visual field; GVF goldmann visual field; OCT optical coherence tomography; IOP intraocular pressure; BRVO Branch retinal vein occlusion; CRVO central retinal vein occlusion; CRAO central retinal artery occlusion; BRAO branch retinal artery occlusion; RT retinal tear; SB scleral buckle; PPV pars plana vitrectomy; VH Vitreous hemorrhage; PRP panretinal laser photocoagulation; IVK intravitreal kenalog; VMT vitreomacular traction; MH Macular hole;  NVD neovascularization of the disc; NVE neovascularization elsewhere; AREDS age related eye disease study; ARMD age related macular degeneration; POAG primary open angle glaucoma; EBMD epithelial/anterior basement membrane dystrophy; ACIOL  anterior chamber intraocular lens; IOL intraocular lens; PCIOL posterior chamber intraocular lens; Phaco/IOL phacoemulsification with intraocular lens placement; [place] photorefractive keratectomy; LASIK laser assisted in situ keratomileusis; HTN hypertension; DM diabetes mellitus; COPD chronic obstructive pulmonary disease.

## 2020-12-05 ENCOUNTER — Encounter (INDEPENDENT_AMBULATORY_CARE_PROVIDER_SITE_OTHER): Payer: Self-pay | Admitting: Ophthalmology

## 2020-12-05 ENCOUNTER — Ambulatory Visit (INDEPENDENT_AMBULATORY_CARE_PROVIDER_SITE_OTHER): Payer: Medicare HMO | Admitting: Ophthalmology

## 2020-12-05 ENCOUNTER — Other Ambulatory Visit: Payer: Self-pay

## 2020-12-05 DIAGNOSIS — H3581 Retinal edema: Secondary | ICD-10-CM

## 2020-12-05 DIAGNOSIS — D6851 Activated protein C resistance: Secondary | ICD-10-CM

## 2020-12-05 DIAGNOSIS — Z961 Presence of intraocular lens: Secondary | ICD-10-CM

## 2020-12-05 DIAGNOSIS — H35033 Hypertensive retinopathy, bilateral: Secondary | ICD-10-CM | POA: Diagnosis not present

## 2020-12-05 DIAGNOSIS — H34811 Central retinal vein occlusion, right eye, with macular edema: Secondary | ICD-10-CM

## 2020-12-05 DIAGNOSIS — I10 Essential (primary) hypertension: Secondary | ICD-10-CM | POA: Diagnosis not present

## 2020-12-05 MED ORDER — BEVACIZUMAB CHEMO INJECTION 1.25MG/0.05ML SYRINGE FOR KALEIDOSCOPE
1.2500 mg | INTRAVITREAL | Status: AC | PRN
  Administered 2020-12-05: 1.25 mg via INTRAVITREAL

## 2020-12-12 ENCOUNTER — Other Ambulatory Visit: Payer: Self-pay

## 2020-12-12 ENCOUNTER — Telehealth: Payer: Self-pay | Admitting: Cardiology

## 2020-12-12 MED ORDER — LOSARTAN POTASSIUM 50 MG PO TABS: 50.0000 mg | ORAL_TABLET | Freq: Every day | ORAL | 1 refills | Status: DC

## 2020-12-12 NOTE — Telephone Encounter (Signed)
*  STAT* If patient is at the pharmacy, call can be transferred to refill team.   1. Which medications need to be refilled? (please list name of each medication and dose if known)  losartan (COZAAR) 50 MG tablet [[id number]]   2. Which pharmacy/location (including street and city if local pharmacy) is medication to be sent to?  [facility] Mail order       3. Do they need a 30 day or 90 day supply? [place]

## 2020-12-25 ENCOUNTER — Ambulatory Visit (INDEPENDENT_AMBULATORY_CARE_PROVIDER_SITE_OTHER): Payer: Medicare HMO

## 2020-12-25 DIAGNOSIS — I495 Sick sinus syndrome: Secondary | ICD-10-CM

## 2020-12-26 LAB — CUP PACEART REMOTE DEVICE CHECK
Battery Remaining Longevity: 131 mo
Battery Remaining Percentage: 95.5 %
Battery Voltage: 3.01 V
Brady Statistic RV Percent Paced: 31 %
Date Time Interrogation Session: 20220509020018
Implantable Lead Implant Date: 20190711
Implantable Lead Implant Date: 20190711
Implantable Lead Location: 753859
Implantable Lead Location: 753860
Implantable Pulse Generator Implant Date: 20190711
Lead Channel Impedance Value: 550 Ohm
Lead Channel Pacing Threshold Amplitude: 1 V
Lead Channel Pacing Threshold Pulse Width: 0.4 ms
Lead Channel Sensing Intrinsic Amplitude: 12 mV
Lead Channel Setting Pacing Amplitude: 2.5 V
Lead Channel Setting Pacing Pulse Width: 0.4 ms
Lead Channel Setting Sensing Sensitivity: 2 mV
Pulse Gen Model: 2272
Pulse Gen Serial Number: 9041042

## 2021-01-11 ENCOUNTER — Other Ambulatory Visit: Payer: Self-pay | Admitting: Cardiology

## 2021-01-12 MED ORDER — METOPROLOL TARTRATE 50 MG PO TABS: 25.0000 mg | ORAL_TABLET | Freq: Two times a day (BID) | ORAL | 3 refills | Status: AC

## 2021-01-12 NOTE — Progress Notes (Signed)
Remote pacemaker transmission.   

## 2021-01-15 NOTE — Progress Notes (Signed)
Cardiology Office Note Date:  01/16/2021  Patient ID:  Carly, Romero 1946-05-06, MRN [id number] PCP:  Aretta Nip, MD  Cardiologist:  [name] Electrophysiologist: Dr. Lovena Le    Chief Complaint:  annual visit  History of Present Illness: Carly Romero is a 75 y.o. female with history of AFib (s/p ablation 2016 and 2017 at [facility]) >>> permanent, COPD (severe, follows with Dr. Lamonte Sakai), HTN, HLD, DVT, factor V Leiden, hypothyroidism.  She comes in today to be seen for Dr. Lovena Le, last seen by him 04/18/2020, had lost weight, doing well with minimal palpitations, no changes were made.  More recently saw [name], noted her echocardiogram obtained on 02/01/2019 showed EF 55 to 60%, inferior basal hypokinesis, trivial AI.   She was seen in November by Almyra Deforest PA-C. She was having atypical chest pain at that time. A Lexiscan myoview was ordered but the patient later declined since her pain had resolved She was doing well planning to move to [place] to be near family.  Her metoprolol reduced to reduce any issues with bronchospasm given severe COPD.  TODAY She is putting her house on the market tomorrow and the idea of this and moving to [facility] in about 6 weeks has her pretty stressed, otherwise doing well. She denies any cardiac awareness or concerns.  No CP, palpitations. No near syncope or syncope, she will get fleetingly dizzy if she stands or turns her head quickly No bleeding or signs of bleeding Breathing is at her baseline with her COPD. No symptoms of PND or orthopnea.  Had her annual physical and labs just 2-3 months ago, reportsed her labs were said to be OK  Device information SJM dual chamber PPM implanted 02/26/2018 Programmed VVI   AFib Hx Failed Tikosyn and intolerant of amio  >> permanent AFib   Past Medical History:  Diagnosis Date  . Atrial fibrillation ([place])   . Factor V Leiden ([place])   . Hyperlipidemia   . Hypertension   . Hypertensive heart disease    . Hypertensive retinopathy    OU  . Hypothyroidism   . Macular degeneration    Dry OU  . Obesity (BMI 30-39.9)   . Thyroid disease     Past Surgical History:  Procedure Laterality Date  . ATRIAL FIBRILLATION ABLATION  12/2014   Dr Glennon Mac at [facility]  . ATRIAL FIBRILLATION ABLATION  08/2015   Dr Glennon Mac at [facility]  . CARDIOVERSION N/A 05/26/2014   Procedure: CARDIOVERSION;  Surgeon: Jacolyn Reedy, MD;  Location: [facility] ENDOSCOPY;  Service: Cardiovascular;  Laterality: N/A;  . CARDIOVERSION N/A 09/14/2014   Procedure: CARDIOVERSION;  Surgeon: Jacolyn Reedy, MD;  Location: [facility] ENDOSCOPY;  Service: Cardiovascular;  Laterality: N/A;  . CARDIOVERSION N/A 05/11/2015   Procedure: CARDIOVERSION;  Surgeon: Jacolyn Reedy, MD;  Location: [place];  Service: Cardiovascular;  Laterality: N/A;  . CATARACT EXTRACTION Bilateral   . COSMETIC SURGERY     lower lids  . DENTAL SURGERY     wisdom teeth  . EYE SURGERY Bilateral    Cat Sx OU  . PACEMAKER IMPLANT N/A 02/26/2018   Procedure: PACEMAKER IMPLANT;  Surgeon: Evans Lance, MD;  Location: [place] CV LAB;  Service: Cardiovascular;  Laterality: N/A;    Current Outpatient Medications  Medication Sig Dispense Refill  . albuterol (VENTOLIN HFA) 108 (90 Base) MCG/ACT inhaler Inhale 2 puffs into the lungs every 6 (six) hours as needed for wheezing or shortness of breath. 18 g 5  .  BIOTIN PO Take 1 tablet by mouth daily.    . Calcium Carbonate-Vitamin D (CALCIUM + D PO) Take 1 tablet by mouth daily.    Marland Kitchen diltiazem (CARDIZEM CD) 240 MG 24 hr capsule Take 1 capsule (240 mg total) by mouth daily. 90 capsule 3  . ELIQUIS 5 MG TABS tablet Take 1 tablet (5 mg total) by mouth 2 (two) times daily. 180 tablet 3  . fluorometholone (FML) 0.1 % ophthalmic suspension     . FLUoxetine (PROZAC) 20 MG capsule Take 20 mg by mouth daily.    Marland Kitchen levothyroxine (SYNTHROID) 125 MCG tablet Take 125 mcg by mouth daily.    Marland Kitchen loratadine (CLARITIN) 10 MG tablet Take 10  mg by mouth daily as needed for allergies.     . metoprolol tartrate (LOPRESSOR) 50 MG tablet Take 0.5 tablets (25 mg total) by mouth 2 (two) times daily. 90 tablet 3  . Multiple Vitamin (MULTIVITAMIN WITH MINERALS) TABS tablet Take 1 tablet by mouth daily.    Vladimir Faster Glycol-Propyl Glycol (SYSTANE OP) Place 1 drop into both eyes daily as needed (dry eyes).     . rosuvastatin (CRESTOR) 40 MG tablet Take 40 mg by mouth daily.    Marland Kitchen SHINGRIX injection     . Tiotropium Bromide-Olodaterol (STIOLTO RESPIMAT) 2.5-2.5 MCG/ACT AERS Inhale 2 puffs into the lungs daily. 12 Inhaler 1  . furosemide (LASIX) 40 MG tablet Take 1 tablet (40 mg total) by mouth daily as needed for fluid or edema. 90 tablet 3  . losartan (COZAAR) 50 MG tablet Take 1 tablet (50 mg total) by mouth daily. 90 tablet 3  . potassium chloride (KLOR-CON) 10 MEQ tablet Take 1 tablet (10 mEq total) by mouth daily. 90 tablet 3   No current facility-administered medications for this visit.    Allergies:   Amiodarone, Chantix [varenicline], Codeine, Lipitor [atorvastatin], and Other   Social History:  The patient  reports that she quit smoking about 7 years ago. She has a 40.00 pack-year smoking history. She has never used smokeless tobacco. She reports current alcohol use of about 8.0 standard drinks of alcohol per week. She reports that she does not use drugs.   Family History:  The patient's family history includes Diabetes in her brother.  ROS:  Please see the history of present illness.    All other systems are reviewed and otherwise negative.   PHYSICAL EXAM:  VS:  BP (!) 150/74   Pulse 91   Ht 5\' 6"  (1.676 m)   Wt 204 lb 9.6 oz (92.8 kg)   SpO2 99%   BMI 33.02 kg/m  BMI: Body mass index is 33.02 kg/m. Well nourished, well developed, in no acute distress HEENT: normocephalic, atraumatic Neck: no JVD, carotid bruits or masses Cardiac:  RRR; no significant murmurs, no rubs, or gallops Lungs:  CTA b/l, not wheezing, no  rhonchi or rales Abd: soft, nontender MS: no deformity or atrophy Ext: no edema Skin: warm and dry, no rash Neuro:  No gross deficits appreciated Psych: euthymic mood, full affect  PPM site is stable, no tethering or discomfort   EKG: not done today  Device interrogation done today and reviewed by myself:  Battery and lead measurements are OK HR histogram looks OK VP 31% 2 HVR episodes are fast AF   02/01/2019: TTE IMPRESSIONS  1. The left ventricle has normal systolic function, with an ejection  fraction of 55-60%. The cavity size was normal. There is severely  increased left ventricular wall  thickness. Left ventricular diastolic  Doppler parameters are indeterminate.  2. Inferior basal hypokinesis but overall EF preserved.  3. The right ventricle has normal systolic function. The cavity was  normal. There is no increase in right ventricular wall thickness.  4. Left atrial size was moderately dilated.  5. Moderate thickening of the mitral valve leaflet. Mild calcification of  the mitral valve leaflet. There is moderate mitral annular calcification  present.  6. The aortic valve was not well visualized. Moderate thickening of the  aortic valve. Sclerosis without any evidence of stenosis of the aortic  valve. Aortic valve regurgitation is trivial by color flow Doppler.    Recent Labs: No results found for requested labs within last 8760 hours.  No results found for requested labs within last 8760 hours.   CrCl cannot be calculated (Patient's most recent lab result is older than the maximum 21 days allowed.).   Wt Readings from Last 3 Encounters:  01/16/21 204 lb 9.6 oz (92.8 kg)  05/29/20 207 lb 3.2 oz (94 kg)  04/18/20 206 lb 3.2 oz (93.5 kg)     Other studies reviewed: Additional studies/records reviewed today include: summarized above  ASSESSMENT AND PLAN:  1. PPM     Intact function, no programming changes made  Discussed need to get a PMD and  referred to a cardiologist early after moving, and once she has a cardiologist/electrophysiologist she will need to reach out to [place] to transfer her device monitoring  2. Permanent Afib 3. Hx of factor V Leiden and DVT     CHA2DS2Vasc is 5, on Eliquis, appropriately dosed     Generally rate appears controlled, majority 70-90's  4. HTN     No changes today, she thinks unusually elevated with increased stress     She is asked to monitor  Disposition: F/u with remotes as usual, she is likely to be moving to [place] in the next 6 weeks, we remain available to her here as long as she is here.   Current medicines are reviewed at length with the patient today.  The patient did not have any concerns regarding medicines.  Venetia Night, PA-C 01/16/2021 1:31 PM     [place] [address] [place] [address] [phone] (office)  [phone] (fax)

## 2021-01-16 ENCOUNTER — Ambulatory Visit (INDEPENDENT_AMBULATORY_CARE_PROVIDER_SITE_OTHER): Payer: Medicare HMO | Admitting: Ophthalmology

## 2021-01-16 ENCOUNTER — Other Ambulatory Visit: Payer: Self-pay

## 2021-01-16 ENCOUNTER — Encounter: Payer: Self-pay | Admitting: Physician Assistant

## 2021-01-16 ENCOUNTER — Ambulatory Visit: Payer: Medicare HMO | Admitting: Physician Assistant

## 2021-01-16 VITALS — BP 150/74 | HR 91 | Ht 66.0 in | Wt 204.6 lb

## 2021-01-16 DIAGNOSIS — H34811 Central retinal vein occlusion, right eye, with macular edema: Secondary | ICD-10-CM

## 2021-01-16 DIAGNOSIS — Z950 Presence of cardiac pacemaker: Secondary | ICD-10-CM

## 2021-01-16 DIAGNOSIS — I4821 Permanent atrial fibrillation: Secondary | ICD-10-CM

## 2021-01-16 DIAGNOSIS — H35033 Hypertensive retinopathy, bilateral: Secondary | ICD-10-CM

## 2021-01-16 DIAGNOSIS — Z961 Presence of intraocular lens: Secondary | ICD-10-CM | POA: Diagnosis not present

## 2021-01-16 DIAGNOSIS — I10 Essential (primary) hypertension: Secondary | ICD-10-CM | POA: Diagnosis not present

## 2021-01-16 DIAGNOSIS — I495 Sick sinus syndrome: Secondary | ICD-10-CM

## 2021-01-16 DIAGNOSIS — D6851 Activated protein C resistance: Secondary | ICD-10-CM | POA: Diagnosis not present

## 2021-01-16 DIAGNOSIS — H3581 Retinal edema: Secondary | ICD-10-CM

## 2021-01-16 LAB — CUP PACEART INCLINIC DEVICE CHECK
Battery Remaining Longevity: 132 mo
Battery Voltage: 3.01 V
Brady Statistic RA Percent Paced: 0 %
Brady Statistic RV Percent Paced: 31 %
Date Time Interrogation Session: 20220531173632
Implantable Lead Implant Date: 20190711
Implantable Lead Implant Date: 20190711
Implantable Lead Location: 753859
Implantable Lead Location: 753860
Implantable Pulse Generator Implant Date: 20190711
Lead Channel Impedance Value: 562.5 Ohm
Lead Channel Pacing Threshold Amplitude: 0.75 V
Lead Channel Pacing Threshold Amplitude: 1 V
Lead Channel Pacing Threshold Amplitude: 1 V
Lead Channel Pacing Threshold Pulse Width: 0.4 ms
Lead Channel Pacing Threshold Pulse Width: 0.4 ms
Lead Channel Pacing Threshold Pulse Width: 0.5 ms
Lead Channel Sensing Intrinsic Amplitude: 1.4 mV
Lead Channel Sensing Intrinsic Amplitude: 12 mV
Lead Channel Setting Pacing Amplitude: 2.5 V
Lead Channel Setting Pacing Pulse Width: 0.4 ms
Lead Channel Setting Sensing Sensitivity: 2 mV
Pulse Gen Model: 2272
Pulse Gen Serial Number: 9041042

## 2021-01-16 MED ORDER — FUROSEMIDE 40 MG PO TABS: 40.0000 mg | ORAL_TABLET | Freq: Every day | ORAL | 3 refills | Status: AC | PRN

## 2021-01-16 MED ORDER — ELIQUIS 5 MG PO TABS: 5.0000 mg | ORAL_TABLET | Freq: Two times a day (BID) | ORAL | 3 refills | Status: AC

## 2021-01-16 MED ORDER — LOSARTAN POTASSIUM 50 MG PO TABS: 50.0000 mg | ORAL_TABLET | Freq: Every day | ORAL | 3 refills | Status: AC

## 2021-01-16 MED ORDER — POTASSIUM CHLORIDE CRYS ER 10 MEQ PO TBCR: 10.0000 meq | EXTENDED_RELEASE_TABLET | Freq: Every day | ORAL | 3 refills | Status: DC

## 2021-01-16 NOTE — Progress Notes (Deleted)
Cardiology Clinic Note   Patient Name: Carly Romero Date of Encounter: 01/16/2021  Primary Care Provider:  Aretta Nip, MD Primary Cardiologist:  Peter Martinique, MD  Patient Profile    Carly Romero 75 year old female presents to the clinic today for follow-up evaluation of her persistent atrial fibrillation and hypertension.  Past Medical History    Past Medical History:  Diagnosis Date  . Atrial fibrillation ([place])   . Factor V Leiden ([place])   . Hyperlipidemia   . Hypertension   . Hypertensive heart disease   . Hypertensive retinopathy    OU  . Hypothyroidism   . Macular degeneration    Dry OU  . Obesity (BMI 30-39.9)   . Thyroid disease    Past Surgical History:  Procedure Laterality Date  . ATRIAL FIBRILLATION ABLATION  12/2014   Dr Glennon Mac at [facility]  . ATRIAL FIBRILLATION ABLATION  08/2015   Dr Glennon Mac at [facility]  . CARDIOVERSION N/A 05/26/2014   Procedure: CARDIOVERSION;  Surgeon: Jacolyn Reedy, MD;  Location: [facility] ENDOSCOPY;  Service: Cardiovascular;  Laterality: N/A;  . CARDIOVERSION N/A 09/14/2014   Procedure: CARDIOVERSION;  Surgeon: Jacolyn Reedy, MD;  Location: [facility] ENDOSCOPY;  Service: Cardiovascular;  Laterality: N/A;  . CARDIOVERSION N/A 05/11/2015   Procedure: CARDIOVERSION;  Surgeon: Jacolyn Reedy, MD;  Location: [place];  Service: Cardiovascular;  Laterality: N/A;  . CATARACT EXTRACTION Bilateral   . COSMETIC SURGERY     lower lids  . DENTAL SURGERY     wisdom teeth  . EYE SURGERY Bilateral    Cat Sx OU  . PACEMAKER IMPLANT N/A 02/26/2018   Procedure: PACEMAKER IMPLANT;  Surgeon: Evans Lance, MD;  Location: [place] [facility];  Service: Cardiovascular;  Laterality: N/A;    Allergies  Allergies  Allergen Reactions  . Amiodarone Other (See Comments)    alopecia  . Chantix [Varenicline]     Suicidal thoughts  . Codeine Swelling    Swelling as a young adult  . Lipitor [Atorvastatin] Diarrhea    And cramping.  . Other      Other reaction(s): suicidal thoughts    History of Present Illness    Carly Romero has a PMH of atrial fibrillation status post ablation 2016 and 2017 at [facility], COPD- severe, follows with Dr. Lamonte Sakai, HTN, HLD, DVT, factor V Leiden, and hypothyroidism.  Echocardiogram 6/20 showed an EF of 55-60% with inferior basal hypokinesis and trivial AI.  She was seen by Almyra Deforest, PA-C in November.  During that time she was having atypical chest pain.  A stress test was ordered but later declined because her chest discomfort had resolved.  She reported she was doing well and planning to move to [place] to be near her family.  Her metoprolol was reduced to reduce any issues with bronchospasm due to her severe COPD.  She was seen by Tommye Standard PA-C on 01/16/2021.  During that time she reported she was putting her house on the market and continue to plan to move to [place].  She reported she would hopefully move in around 6 weeks.  She did note increased stress with the moving process but was overall doing well.  She denied cardiac awareness.  She denied chest pain and palpitations.  She denied near syncope, syncope, but did note some fleeting dizziness with standing up or turning her head quickly.  She denied bleeding issues and reported her breathing was at baseline.  She presents to the clinic  today for follow-up evaluation states***  *** denies chest pain, shortness of breath, lower extremity edema, fatigue, palpitations, melena, hematuria, hemoptysis, diaphoresis, weakness, presyncope, syncope, orthopnea, and PND.   Home Medications    Prior to Admission medications   Medication Sig Start Date End Date Taking? Authorizing Provider  albuterol (VENTOLIN HFA) 108 (90 Base) MCG/ACT inhaler Inhale 2 puffs into the lungs every 6 (six) hours as needed for wheezing or shortness of breath. 04/29/19   Collene Gobble, MD  BIOTIN PO Take 1 tablet by mouth daily.    [provider]  Calcium  Carbonate-Vitamin D (CALCIUM + D PO) Take 1 tablet by mouth daily.    [provider]  diltiazem (CARDIZEM CD) 240 MG 24 hr capsule Take 1 capsule (240 mg total) by mouth daily. 05/29/20 05/24/21  Martinique, Peter M, MD  ELIQUIS 5 MG TABS tablet Take 1 tablet (5 mg total) by mouth 2 (two) times daily. 05/29/20   Martinique, Peter M, MD  fluorometholone (FML) 0.1 % ophthalmic suspension  10/11/19   [provider]  FLUoxetine (PROZAC) 20 MG capsule Take 20 mg by mouth daily. 06/25/19   [provider]  furosemide (LASIX) 40 MG tablet Take 1 tablet (40 mg total) by mouth daily as needed for fluid or edema. 05/29/20 05/24/21  Martinique, Peter M, MD  levothyroxine (SYNTHROID) 125 MCG tablet Take 125 mcg by mouth daily. 02/05/20   [provider]  loratadine (CLARITIN) 10 MG tablet Take 10 mg by mouth daily as needed for allergies.     [provider]  losartan (COZAAR) 50 MG tablet Take 1 tablet (50 mg total) by mouth daily. 12/12/20   Martinique, Peter M, MD  metoprolol tartrate (LOPRESSOR) 50 MG tablet Take 0.5 tablets (25 mg total) by mouth 2 (two) times daily. 01/12/21   Martinique, Peter M, MD  Multiple Vitamin (MULTIVITAMIN WITH MINERALS) TABS tablet Take 1 tablet by mouth daily.    [provider]  Polyethyl Glycol-Propyl Glycol (SYSTANE OP) Place 1 drop into both eyes daily as needed (dry eyes).     [provider]  potassium chloride (KLOR-CON) 10 MEQ tablet Take 1 tablet (10 mEq total) by mouth daily. Please keep upcoming appt in July with Dr. Lovena Le before anymore refills. Thank you 05/29/20   Martinique, Peter M, MD  rosuvastatin (CRESTOR) 40 MG tablet Take 40 mg by mouth daily. 04/14/20   [provider]  [facility] injection  07/20/19   [provider]  Tiotropium Bromide-Olodaterol (STIOLTO RESPIMAT) 2.5-2.5 MCG/ACT AERS Inhale 2 puffs into the lungs daily. 12/30/18   Collene Gobble, MD    Family History    Family History  Problem  Relation Age of Onset  . Diabetes Brother    She indicated that her mother is deceased. She indicated that her father is deceased. She indicated that her brother is alive.  Social History    Social History   Socioeconomic History  . Marital status: Single    Spouse name: Not on file  . Number of children: Not on file  . Years of education: Not on file  . Highest education level: Not on file  Occupational History  . Not on file  Tobacco Use  . Smoking status: Former Smoker    Packs/day: 1.00    Years: 40.00    Pack years: 40.00    Quit date: 12/11/2013    Years since quitting: 7.1  . Smokeless tobacco: Never Used  Substance and Sexual  Activity  . Alcohol use: Yes    Alcohol/week: 8.0 standard drinks    Types: 8 Glasses of wine per week  . Drug use: No  . Sexual activity: Never  Other Topics Concern  . Not on file  Social History Narrative  . Not on file   Social Determinants of Health   Financial Resource Strain: Not on file  Food Insecurity: Not on file  Transportation Needs: Not on file  Physical Activity: Not on file  Stress: Not on file  Social Connections: Not on file  Intimate Partner Violence: Not on file     Review of Systems    General:  No chills, fever, night sweats or weight changes.  Cardiovascular:  No chest pain, dyspnea on exertion, edema, orthopnea, palpitations, paroxysmal nocturnal dyspnea. Dermatological: No rash, lesions/masses Respiratory: No cough, dyspnea Urologic: No hematuria, dysuria Abdominal:   No nausea, vomiting, diarrhea, bright red blood per rectum, melena, or hematemesis Neurologic:  No visual changes, wkns, changes in mental status. All other systems reviewed and are otherwise negative except as noted above.  Physical Exam    VS:  There were no vitals taken for this visit. , BMI There is no height or weight on file to calculate BMI. GEN: Well nourished, well developed, in no acute distress. HEENT: normal. Neck: Supple, no  JVD, carotid bruits, or masses. Cardiac: RRR, no murmurs, rubs, or gallops. No clubbing, cyanosis, edema.  Radials/DP/PT 2+ and equal bilaterally.  Respiratory:  Respirations regular and unlabored, clear to auscultation bilaterally. GI: Soft, nontender, nondistended, BS + x 4. MS: no deformity or atrophy. Skin: warm and dry, no rash. Neuro:  Strength and sensation are intact. Psych: Normal affect.  Accessory Clinical Findings    Recent Labs: No results found for requested labs within last 8760 hours.   Recent Lipid Panel    Component Value Date/Time   CHOL 208 (H) 01/14/2018 1009   TRIG 164 (H) 01/14/2018 1009   HDL 73 01/14/2018 1009   CHOLHDL 2.8 01/14/2018 1009   LDLCALC 102 (H) 01/14/2018 1009    ECG personally reviewed by me today- *** - No acute changes  Echocardiogram 02/01/2019 IMPRESSIONS    1. The left ventricle has normal systolic function, with an ejection  fraction of 55-60%. The cavity size was normal. There is severely  increased left ventricular wall thickness. Left ventricular diastolic  Doppler parameters are indeterminate.  2. Inferior basal hypokinesis but overall EF preserved.  3. The right ventricle has normal systolic function. The cavity was  normal. There is no increase in right ventricular wall thickness.  4. Left atrial size was moderately dilated.  5. Moderate thickening of the mitral valve leaflet. Mild calcification of  the mitral valve leaflet. There is moderate mitral annular calcification  present.  6. The aortic valve was not well visualized. Moderate thickening of the  aortic valve. Sclerosis without any evidence of stenosis of the aortic  valve. Aortic valve regurgitation is trivial by color flow Doppler.   Assessment & Plan   1.  Permanent atrial fibrillation- cardiac unaware.  CHA2DS2-VASc score 5.  Reports compliance with apixaban.  Denies bleeding issues. Continue apixaban, metoprolol, diltiazem Heart healthy low-sodium  diet-salty 6 given Increase physical activity as tolerated  Essential hypertension-BP today***.  Well-controlled at home. Continue losartan, metoprolol, diltiazem, furosemide, potassium Heart healthy low-sodium diet-salty 6 given Increase physical activity as tolerated Order BMP  Hyperlipidemia- on statin therapy Continue rosuvastatin Heart healthy low-sodium high-fiber diet  PPM- seen and evaluated  by EP on 01/16/2021.  Device functioning normally.  No device changes made.  Factor V Leiden/DVT- denies increased DOE and lower extremity discomfort.  Reports compliance with apixaban and denies bleeding issues. Continue apixaban   Disposition: Follow-up with [name] if needed in 6 months.  Planning to move to [place] in the next 6 weeks.  Jossie Ng. Joyce Heitman NP-C    01/16/2021, [address] Office [phone] Fax [phone]  Notice: This dictation was prepared with Dragon dictation along with smaller phrase technology. Any transcriptional errors that result from this process are unintentional and may not be corrected upon review.  I spent***minutes examining this patient, reviewing medications, and using patient centered shared decision making involving her cardiac care.  Prior to her visit I spent greater than 20 minutes reviewing her past medical history,  medications, and prior cardiac tests.

## 2021-01-16 NOTE — Progress Notes (Signed)
[facility] Note  01/16/2021     CHIEF COMPLAINT Patient presents for Retina Follow Up   HISTORY OF PRESENT ILLNESS: Carly Romero is a 75 y.o. female who presents to the clinic today for:  HPI    Retina Follow Up    Patient presents with  CRVO/BRVO.  In right eye.  This started weeks ago.  Severity is moderate.  Duration of weeks.  Since onset it is gradually worsening.  I, the attending physician,  performed the HPI with the patient and updated documentation appropriately.          Comments    Pt states her vision is worse in her right eye x 4 days.  Pt states it looks like her glasses are dirty in the center of vision OD.  Pt denies decrease in vision OS.  Pt denies eye pain or discomfort and denies any new or worsening floaters or fol OU.       Last edited by Bernarda Caffey, MD on 01/18/2021  8:30 AM. (History)     Referring physician: Hortencia Pilar, MD [place],  [place] [address]  HISTORICAL INFORMATION:   Selected notes from the MEDICAL RECORD NUMBER Referral from Dr. Kathlen Mody for eval of CRVO w/edema OD.   CURRENT MEDICATIONS: Current Outpatient Medications (Ophthalmic Drugs)  Medication Sig  . fluorometholone (FML) 0.1 % ophthalmic suspension   . Polyethyl Glycol-Propyl Glycol (SYSTANE OP) Place 1 drop into both eyes daily as needed (dry eyes).    No current facility-administered medications for this visit. (Ophthalmic Drugs)   Current Outpatient Medications (Other)  Medication Sig  . albuterol (VENTOLIN HFA) 108 (90 Base) MCG/ACT inhaler Inhale 2 puffs into the lungs every 6 (six) hours as needed for wheezing or shortness of breath.  Marland Kitchen BIOTIN PO Take 1 tablet by mouth daily.  . Calcium Carbonate-Vitamin D (CALCIUM + D PO) Take 1 tablet by mouth daily.  Marland Kitchen diltiazem (CARDIZEM CD) 240 MG 24 hr capsule Take 1 capsule (240 mg total) by mouth daily.  Marland Kitchen ELIQUIS 5 MG TABS tablet Take 1 tablet (5 mg total) by mouth 2  (two) times daily.  Marland Kitchen FLUoxetine (PROZAC) 20 MG capsule Take 20 mg by mouth daily.  . furosemide (LASIX) 40 MG tablet Take 1 tablet (40 mg total) by mouth daily as needed for fluid or edema.  Marland Kitchen levothyroxine (SYNTHROID) 125 MCG tablet Take 125 mcg by mouth daily.  Marland Kitchen loratadine (CLARITIN) 10 MG tablet Take 10 mg by mouth daily as needed for allergies.   Marland Kitchen losartan (COZAAR) 50 MG tablet Take 1 tablet (50 mg total) by mouth daily.  . metoprolol tartrate (LOPRESSOR) 50 MG tablet Take 0.5 tablets (25 mg total) by mouth 2 (two) times daily.  . Multiple Vitamin (MULTIVITAMIN WITH MINERALS) TABS tablet Take 1 tablet by mouth daily.  . potassium chloride (KLOR-CON) 10 MEQ tablet Take 1 tablet (10 mEq total) by mouth daily.  . rosuvastatin (CRESTOR) 40 MG tablet Take 40 mg by mouth daily.  Marland Kitchen SHINGRIX injection   . Tiotropium Bromide-Olodaterol (STIOLTO RESPIMAT) 2.5-2.5 MCG/ACT AERS Inhale 2 puffs into the lungs daily.   No current facility-administered medications for this visit. (Other)      REVIEW OF SYSTEMS: ROS    Positive for: Musculoskeletal, Endocrine, Cardiovascular, Eyes, Respiratory   Negative for: Constitutional, Gastrointestinal, Neurological, Skin, Genitourinary, HENT, Psychiatric, Allergic/Imm, Heme/Lymph   Last edited by Doneen Poisson on 01/16/2021  2:52 PM. (History)  ALLERGIES Allergies  Allergen Reactions  . Amiodarone Other (See Comments)    alopecia  . Chantix [Varenicline]     Suicidal thoughts  . Codeine Swelling    Swelling as a young adult  . Lipitor [Atorvastatin] Diarrhea    And cramping.  . Other     Other reaction(s): suicidal thoughts    PAST MEDICAL HISTORY Past Medical History:  Diagnosis Date  . Atrial fibrillation ([place])   . Factor V Leiden ([place])   . Hyperlipidemia   . Hypertension   . Hypertensive heart disease   . Hypertensive retinopathy    OU  . Hypothyroidism   . Macular degeneration    Dry OU  . Obesity (BMI 30-39.9)   .  Thyroid disease    Past Surgical History:  Procedure Laterality Date  . ATRIAL FIBRILLATION ABLATION  12/2014   Dr Glennon Mac at [facility]  . ATRIAL FIBRILLATION ABLATION  08/2015   Dr Glennon Mac at [facility]  . CARDIOVERSION N/A 05/26/2014   Procedure: CARDIOVERSION;  Surgeon: Jacolyn Reedy, MD;  Location: [facility] ENDOSCOPY;  Service: Cardiovascular;  Laterality: N/A;  . CARDIOVERSION N/A 09/14/2014   Procedure: CARDIOVERSION;  Surgeon: Jacolyn Reedy, MD;  Location: [facility] ENDOSCOPY;  Service: Cardiovascular;  Laterality: N/A;  . CARDIOVERSION N/A 05/11/2015   Procedure: CARDIOVERSION;  Surgeon: Jacolyn Reedy, MD;  Location: [facility];  Service: Cardiovascular;  Laterality: N/A;  . CATARACT EXTRACTION Bilateral   . COSMETIC SURGERY     lower lids  . DENTAL SURGERY     wisdom teeth  . EYE SURGERY Bilateral    Cat Sx OU  . PACEMAKER IMPLANT N/A 02/26/2018   Procedure: PACEMAKER IMPLANT;  Surgeon: Evans Lance, MD;  Location: [facility];  Service: Cardiovascular;  Laterality: N/A;    FAMILY HISTORY Family History  Problem Relation Age of Onset  . Diabetes Brother     SOCIAL HISTORY Social History   Tobacco Use  . Smoking status: Former Smoker    Packs/day: 1.00    Years: 40.00    Pack years: 40.00    Quit date: 12/11/2013    Years since quitting: 7.1  . Smokeless tobacco: Never Used  Substance Use Topics  . Alcohol use: Yes    Alcohol/week: 8.0 standard drinks    Types: 8 Glasses of wine per week  . Drug use: No         OPHTHALMIC EXAM:  Base Eye Exam    Visual Acuity (Snellen - Linear)      Right Left   Dist cc 20/70 -2 20/20 -2   Dist ph cc NI        Tonometry (Tonopen, 2:56 PM)      Right Left   Pressure 07 07       Pupils      Dark Light Shape React APD   Right 4 3 Round Brisk 0   Left 4 3 Round Brisk 0       Visual Fields      Left Right    Full Full       Extraocular Movement      Right Left    Full Full       Neuro/Psych    Oriented x3:  Yes   Mood/Affect: Normal       Dilation    Both eyes: 1.0% Mydriacyl, 2.5% Phenylephrine @ 2:56 PM        Slit Lamp and Fundus Exam    Slit Lamp  Exam      Right Left   Lids/Lashes Dermatochalasis - upper lid Dermatochalasis - upper lid   Conjunctiva/Sclera White and quiet White and quiet   Cornea Trace PEE, mild arcus, well healed temporal cataract wound, EBMD 1+ PEE, mild arcus, well healed temporal cataract wound   Anterior Chamber deep and clear deep and clear   Iris round and dilated round and dilated   Lens PC IOL in perfect position, open PC PC IOL in perfect position, open PC   Vitreous syneresis syneresis, Posterior vitreous detachment       Fundus Exam      Right Left   Disc Optic disc edema, hyperemia, vascular loops, focal PPP temporal, Compact compact, mild tilt, pink and sharp   C/D Ratio 0.2 0.4   Macula Blunted foveal reflex, central edema/DME, rare MA flat, good foveal reflex, mild RPE mottling and clumping, trace drusen, no edema, rare IRH/DBH   Vessels Dilated and Tortuous, +CRVO attenuated, tortuous, mild A/V crossing changes   Periphery Attached, scattered IRH - improved, mild reticular degeneration attached, mild reticular degeneration        Refraction    Wearing Rx      Sphere Cylinder Add   Right -0.75 Sphere +2.50   Left -0.50 Sphere +2.50   Type: PAL       Manifest Refraction      Sphere Cylinder Axis Dist VA   Right -1.00 +0.50 165 20/70+1   Left              IMAGING AND PROCEDURES  Imaging and Procedures for @TODAY @  CUP [facility] INCLINIC DEVICE CHECK     Component Value Flag Ref Range Units Status   Date Time Interrogation Session [id number]        Final   Pulse Generator Manufacturer [facility]        Final   Pulse Gen Model 2272 Assurity MRI        Final   Pulse Gen Serial Number [id number]        Final   Clinic Name [facility]        Final   Implantable Pulse Generator Type Implantable Pulse Generator        Final    Implantable Pulse Generator Implant Date [id number]        Final   Implantable Lead Manufacturer [facility]        Final   Implantable Lead Model LPA1200M Tendril MRI        Final   Implantable Lead Serial Number [id number]        Final   Implantable Lead Implant Date 60630160        Final   Implantable Lead Location Detail 1 UNKNOWN        Final   Implantable Lead Location [id number]        Final   Implantable Lead Manufacturer [facility]        Final   Implantable Lead Model LPA1200M Tendril MRI        Final   Implantable Lead Serial Number [id number]        Final   Implantable Lead Implant Date [id number]        Final   Implantable Lead Location Detail 1 UNKNOWN        Final   Implantable Lead Location [id number]        Final   Lead Channel Setting Sensing Sensitivity 2.0       mV Final   Lead Channel Setting  Pacing Pulse Width 0.4       ms Final   Lead Channel Setting Pacing Amplitude 2.5       V Final   Lead Channel Sensing Intrinsic Amplitude 1.4       mV Final   Lead Channel Pacing Threshold Amplitude 0.75       V Final   Lead Channel Pacing Threshold Pulse Width 0.5       ms Final   Lead Channel Impedance Value 562.5       ohm Final   Lead Channel Sensing Intrinsic Amplitude 12.0       mV Final   Lead Channel Pacing Threshold Amplitude 1.0       V Final   Lead Channel Pacing Threshold Pulse Width 0.4       ms Final   Lead Channel Pacing Threshold Amplitude 1.0       V Final   Lead Channel Pacing Threshold Pulse Width 0.4       ms Final   Battery Status Unknown        Final   Battery Remaining Longevity 132       mo Final   Battery Voltage 3.01       V Final   Brady Statistic RA Percent Paced 0       % Final   Brady Statistic RV Percent Paced 31.0       % Final   Eval Rhythm AF        Final         Linked Images      OCT, Retina - OU - Both Eyes       Right Eye Quality was good. Central Foveal Thickness: 477. Progression has worsened. Findings include no SRF, abnormal foveal contour,  intraretinal fluid (Interval redevelopment of IRF).   Left Eye Quality was good. Central Foveal Thickness: 254. Progression has been stable. Findings include normal foveal contour, no IRF, no SRF, retinal drusen  (Trace drusen).   Notes *Images captured and stored on drive  Diagnosis / Impression:  OD: Interval redevelopment of IRF OS: NFP, no IRF/SRF; Trace drusen  Clinical management:  See below  Abbreviations: NFP - Normal foveal profile. CME - cystoid macular edema. PED - pigment epithelial detachment. IRF - intraretinal fluid. SRF - subretinal fluid. EZ - ellipsoid zone. ERM - epiretinal membrane. ORA - outer retinal atrophy. ORT - outer retinal tubulation. SRHM - subretinal hyper-reflective material        Intravitreal Injection, Pharmacologic Agent - OD - Right Eye       Time Out 01/16/2021. 3:53 PM. Confirmed correct patient, procedure, site, and patient consented.   Anesthesia Topical anesthesia was used. Anesthetic medications included Lidocaine 2%, Proparacaine 0.5%.   Procedure Preparation included 5% betadine to ocular surface, eyelid speculum. A supplied needle was used.   Injection:  1.25 mg Bevacizumab (AVASTIN) 1.25mg /0.34mL SOLN   NDC: 30865-784-69, Lot: [id number], Expiration date: 02/28/2021   Route: Intravitreal, Site: Right Eye, Waste: 0.05 mL  Post-op Post injection exam found visual acuity of at least counting fingers. The patient tolerated the procedure well. There were no complications. The patient received written and verbal post procedure care education. Post injection medications were not given.                 ASSESSMENT/PLAN:    ICD-10-CM   1. Central retinal vein occlusion with macular edema of right eye  H34.8110 Intravitreal Injection, Pharmacologic Agent - OD - Right Eye  Bevacizumab (AVASTIN) SOLN 1.25 mg  2. Retinal edema  H35.81 OCT, Retina - OU - Both Eyes  3. Factor V Leiden ([place])  D68.51   4. Essential hypertension  I10    5. Hypertensive retinopathy of both eyes  H35.033   6. Pseudophakia of both eyes  Z96.1     1-3. CRVO with macular edema OD        History of Factor V Leiden on Eliquis  - pt reports history of blood clots / DVTs  - significant CV history with A fib s/p ablation and pacemaker implantation             - s/p IVA OD # 1 (03.09.21), #2 (04.06.21), #3 (5.4.21), #4 (06.01.21), #5 (07.07.21), #6 (08.11.21), #7 (09.15.2021), #8 (10.20.21), #9 (11.17.21), #10 (12.20.21), #11 (1.24.22), #12 (2.22.22), #13 (3.22.22), #14 (4.19.22)  **history of recurrent CME at 5 wk interval (10.20.21) and on (5.31.22)**  - FA (04.06.21) shows delayed filling time, hyperfluorescence of the disc, mild focal leakage along IT arcades, mild focal leakage perifovea  - OCT today shows Interval redevelopment of IRF at 6 wks  - BCVA 20/70 from 20/30  - Patient is at 6 weeks now instead of 4 weeks with increase in IRF  - recommend IVA OD #15 today, 5.31.22  - pt wishes to proceed  - RBA of procedure discussed, questions answered  - IVA informed consent obtained and signed (OD)  - see procedure note  - patient will be moving to [place] soon.  She wants to make an appt for next month.  She will look at the retina specialist around the area and report back.    - return 4 week DFE, OCT, possible injection  4,5. Hypertensive retinopathy OU  - discussed importance of tight BP control  - monitor  6. Pseudophakia OU  - s/p CE/IOL OU w/ Dr. Valetta Close  - IOLs in good position   - monitor  Ophthalmic Meds Ordered this visit:  Meds ordered this encounter  Medications  . Bevacizumab (AVASTIN) SOLN 1.25 mg      Return in about 4 weeks (around 02/13/2021) for DFE, OCT, possible injection.  There are no Patient Instructions on file for this visit.  This document serves as a record of services personally performed by Gardiner Sleeper, MD, PhD. It was created on their behalf by Leonie Douglas, an ophthalmic technician. The  creation of this record is the provider's dictation and/or activities during the visit.    Electronically signed by: Leonie Douglas COA, 01/18/21  8:37 AM  This document serves as a record of services personally performed by Gardiner Sleeper, MD, PhD. It was created on their behalf by Estill Bakes, COT an ophthalmic technician. The creation of this record is the provider's dictation and/or activities during the visit.    Electronically signed by: Estill Bakes, COT 5.31.22 @ 8:37 AM  Gardiner Sleeper, M.D., Ph.D. Diseases & Surgery of the Retina and Vitreous [facility]  I have reviewed the above documentation for accuracy and completeness, and I agree with the above. Gardiner Sleeper, M.D., Ph.D. 01/18/21 8:37 AM   Abbreviations: M myopia (nearsighted); A astigmatism; H hyperopia (farsighted); P presbyopia; Mrx spectacle prescription;  CTL contact lenses; OD right eye; OS left eye; OU both eyes  XT exotropia; ET esotropia; PEK punctate epithelial keratitis; PEE punctate epithelial erosions; DES dry eye syndrome; MGD meibomian gland dysfunction; ATs artificial tears; PFAT's preservative free artificial tears; [place] nuclear  sclerotic cataract; PSC posterior subcapsular cataract; ERM epi-retinal membrane; PVD posterior vitreous detachment; RD retinal detachment; DM diabetes mellitus; DR diabetic retinopathy; NPDR non-proliferative diabetic retinopathy; PDR proliferative diabetic retinopathy; CSME clinically significant macular edema; DME diabetic macular edema; dbh dot blot hemorrhages; CWS cotton wool spot; POAG primary open angle glaucoma; C/D cup-to-disc ratio; HVF humphrey visual field; GVF goldmann visual field; OCT optical coherence tomography; IOP intraocular pressure; BRVO Branch retinal vein occlusion; CRVO central retinal vein occlusion; CRAO central retinal artery occlusion; BRAO branch retinal artery occlusion; RT retinal tear; SB scleral buckle; PPV pars plana vitrectomy;  VH Vitreous hemorrhage; PRP panretinal laser photocoagulation; IVK intravitreal kenalog; VMT vitreomacular traction; MH Macular hole;  NVD neovascularization of the disc; NVE neovascularization elsewhere; AREDS age related eye disease study; ARMD age related macular degeneration; POAG primary open angle glaucoma; EBMD epithelial/anterior basement membrane dystrophy; ACIOL anterior chamber intraocular lens; IOL intraocular lens; PCIOL posterior chamber intraocular lens; Phaco/IOL phacoemulsification with intraocular lens placement; [place] photorefractive keratectomy; LASIK laser assisted in situ keratomileusis; HTN hypertension; DM diabetes mellitus; COPD chronic obstructive pulmonary disease.

## 2021-01-16 NOTE — Patient Instructions (Signed)
Medication Instructions:   Your physician recommends that you continue on your current medications as directed. Please refer to the Current Medication list given to you today.   *If you need a refill on your cardiac medications before your next appointment, please call your pharmacy*   Lab Work: [place]   If you have labs (blood work) drawn today and your tests are completely normal, you will receive your results only by: Marland Kitchen MyChart Message (if you have MyChart) OR . A paper copy in the mail If you have any lab test that is abnormal or we need to change your treatment, we will call you to review the results.   Testing/Procedures: NONE ORDERED  TODAY   Follow-Up: At [facility], you and your health needs are our priority.  As part of our continuing mission to provide you with exceptional heart care, we have created designated Provider Care Teams.  These Care Teams include your primary Cardiologist (physician) and Advanced Practice Providers (APPs -  Physician Assistants and Nurse Practitioners) who all work together to provide you with the care you need, when you need it.  We recommend signing up for the patient portal called "MyChart".  Sign up information is provided on this After Visit Summary.  MyChart is used to connect with patients for Virtual Visits (Telemedicine).  Patients are able to view lab/test results, encounter notes, upcoming appointments, etc.  Non-urgent messages can be sent to your provider as well.   To learn more about what you can do with MyChart, go to [email or web address].    Your next appointment:   1 year(s)  The format for your next appointment:   In Person  Provider:   You may see [name], MD or one of the following Advanced Practice Providers on your designated Care Team:    Chanetta Marshall, NP  Tommye Standard, PA-C  Legrand Como "Oda Kilts, [place]    Other Instructions

## 2021-01-18 ENCOUNTER — Ambulatory Visit: Payer: Medicare HMO | Admitting: General Practice

## 2021-01-18 ENCOUNTER — Encounter (INDEPENDENT_AMBULATORY_CARE_PROVIDER_SITE_OTHER): Payer: Self-pay | Admitting: Ophthalmology

## 2021-01-18 DIAGNOSIS — H3581 Retinal edema: Secondary | ICD-10-CM | POA: Diagnosis not present

## 2021-01-18 DIAGNOSIS — I10 Essential (primary) hypertension: Secondary | ICD-10-CM | POA: Diagnosis not present

## 2021-01-18 DIAGNOSIS — Z961 Presence of intraocular lens: Secondary | ICD-10-CM | POA: Diagnosis not present

## 2021-01-18 DIAGNOSIS — H34811 Central retinal vein occlusion, right eye, with macular edema: Secondary | ICD-10-CM

## 2021-01-18 DIAGNOSIS — D6851 Activated protein C resistance: Secondary | ICD-10-CM | POA: Diagnosis not present

## 2021-01-18 DIAGNOSIS — H35033 Hypertensive retinopathy, bilateral: Secondary | ICD-10-CM | POA: Diagnosis not present

## 2021-01-18 MED ORDER — BEVACIZUMAB CHEMO INJECTION 1.25MG/0.05ML SYRINGE FOR KALEIDOSCOPE
1.2500 mg | INTRAVITREAL | Status: AC | PRN
  Administered 2021-01-18: 1.25 mg via INTRAVITREAL

## 2021-02-13 ENCOUNTER — Other Ambulatory Visit: Payer: Self-pay

## 2021-02-13 ENCOUNTER — Ambulatory Visit (INDEPENDENT_AMBULATORY_CARE_PROVIDER_SITE_OTHER): Payer: Medicare HMO | Admitting: Ophthalmology

## 2021-02-13 ENCOUNTER — Encounter (INDEPENDENT_AMBULATORY_CARE_PROVIDER_SITE_OTHER): Payer: Self-pay | Admitting: Ophthalmology

## 2021-02-13 DIAGNOSIS — D6851 Activated protein C resistance: Secondary | ICD-10-CM

## 2021-02-13 DIAGNOSIS — I10 Essential (primary) hypertension: Secondary | ICD-10-CM

## 2021-02-13 DIAGNOSIS — H3581 Retinal edema: Secondary | ICD-10-CM | POA: Diagnosis not present

## 2021-02-13 DIAGNOSIS — H35033 Hypertensive retinopathy, bilateral: Secondary | ICD-10-CM

## 2021-02-13 DIAGNOSIS — H34811 Central retinal vein occlusion, right eye, with macular edema: Secondary | ICD-10-CM | POA: Diagnosis not present

## 2021-02-13 DIAGNOSIS — Z961 Presence of intraocular lens: Secondary | ICD-10-CM

## 2021-02-13 MED ORDER — BEVACIZUMAB CHEMO INJECTION 1.25MG/0.05ML SYRINGE FOR KALEIDOSCOPE
1.2500 mg | INTRAVITREAL | Status: AC | PRN
  Administered 2021-02-13: 1.25 mg via INTRAVITREAL

## 2021-02-13 NOTE — Progress Notes (Signed)
[facility] Note  02/13/2021     CHIEF COMPLAINT Patient presents for Retina Follow Up   HISTORY OF PRESENT ILLNESS: Carly Romero is a 75 y.o. female who presents to the clinic today for:  HPI     Retina Follow Up   Patient presents with  CRVO/BRVO.  In right eye.  This started 4 weeks ago.  I, the attending physician,  performed the HPI with the patient and updated documentation appropriately.        Comments   Patient here for 4 weeks retina follow up for CRVO OD. Patient states vision about the same. Hasn't gotten better. No eye pain.       Last edited by Bernarda Caffey, MD on 02/13/2021  2:29 PM.    Pt is moving back to [place] in the middle of next month, she states her vision is stable  Referring physician: Hortencia Pilar, MD [address],  [place] [address]  HISTORICAL INFORMATION:   Selected notes from the MEDICAL RECORD NUMBER Referral from Dr. Kathlen Mody for eval of CRVO w/edema OD.   CURRENT MEDICATIONS: Current Outpatient Medications (Ophthalmic Drugs)  Medication Sig   fluorometholone (FML) 0.1 % ophthalmic suspension    Polyethyl Glycol-Propyl Glycol (SYSTANE OP) Place 1 drop into both eyes daily as needed (dry eyes).    No current facility-administered medications for this visit. (Ophthalmic Drugs)   Current Outpatient Medications (Other)  Medication Sig   albuterol (VENTOLIN HFA) 108 (90 Base) MCG/ACT inhaler Inhale 2 puffs into the lungs every 6 (six) hours as needed for wheezing or shortness of breath.   BIOTIN PO Take 1 tablet by mouth daily.   Calcium Carbonate-Vitamin D (CALCIUM + D PO) Take 1 tablet by mouth daily.   diltiazem (CARDIZEM CD) 240 MG 24 hr capsule Take 1 capsule (240 mg total) by mouth daily.   ELIQUIS 5 MG TABS tablet Take 1 tablet (5 mg total) by mouth 2 (two) times daily.   FLUoxetine (PROZAC) 20 MG capsule Take 20 mg by mouth daily.   furosemide (LASIX) 40 MG tablet Take 1 tablet (40  mg total) by mouth daily as needed for fluid or edema.   levothyroxine (SYNTHROID) 125 MCG tablet Take 125 mcg by mouth daily.   loratadine (CLARITIN) 10 MG tablet Take 10 mg by mouth daily as needed for allergies.    losartan (COZAAR) 50 MG tablet Take 1 tablet (50 mg total) by mouth daily.   metoprolol tartrate (LOPRESSOR) 50 MG tablet Take 0.5 tablets (25 mg total) by mouth 2 (two) times daily.   Multiple Vitamin (MULTIVITAMIN WITH MINERALS) TABS tablet Take 1 tablet by mouth daily.   potassium chloride (KLOR-CON) 10 MEQ tablet Take 1 tablet (10 mEq total) by mouth daily.   rosuvastatin (CRESTOR) 40 MG tablet Take 40 mg by mouth daily.   SHINGRIX injection    Tiotropium Bromide-Olodaterol (STIOLTO RESPIMAT) 2.5-2.5 MCG/ACT AERS Inhale 2 puffs into the lungs daily.   No current facility-administered medications for this visit. (Other)      REVIEW OF SYSTEMS: ROS   Positive for: Musculoskeletal, Endocrine, Cardiovascular, Eyes, Respiratory Negative for: Constitutional, Gastrointestinal, Neurological, Skin, Genitourinary, HENT, Psychiatric, Allergic/Imm, Heme/Lymph Last edited by [name] Demark, COA on 02/13/2021  2:13 PM.        ALLERGIES Allergies  Allergen Reactions   Amiodarone Other (See Comments)    alopecia   Chantix [Varenicline]     Suicidal thoughts   Codeine  Swelling    Swelling as a young adult   Lipitor [Atorvastatin] Diarrhea    And cramping.   Other     Other reaction(s): suicidal thoughts    PAST MEDICAL HISTORY Past Medical History:  Diagnosis Date   Atrial fibrillation ([place])    Factor V Leiden ([place])    Hyperlipidemia    Hypertension    Hypertensive heart disease    Hypertensive retinopathy    OU   Hypothyroidism    Macular degeneration    Dry OU   Obesity (BMI 30-39.9)    Thyroid disease    Past Surgical History:  Procedure Laterality Date   ATRIAL FIBRILLATION ABLATION  12/2014   Dr Glennon Mac at [facility]   08/2015   Dr Glennon Mac at [facility] 05/26/2014   Procedure: CARDIOVERSION;  Surgeon: Jacolyn Reedy, MD;  Location: [facility];  Service: Cardiovascular;  Laterality: N/A;   CARDIOVERSION N/A 09/14/2014   Procedure: CARDIOVERSION;  Surgeon: Jacolyn Reedy, MD;  Location: [facility] ENDOSCOPY;  Service: Cardiovascular;  Laterality: N/A;   CARDIOVERSION N/A 05/11/2015   Procedure: CARDIOVERSION;  Surgeon: Jacolyn Reedy, MD;  Location: [facility];  Service: Cardiovascular;  Laterality: N/A;   CATARACT EXTRACTION Bilateral    COSMETIC SURGERY     lower lids   DENTAL SURGERY     wisdom teeth   EYE SURGERY Bilateral    Cat Sx OU   PACEMAKER IMPLANT N/A 02/26/2018   Procedure: PACEMAKER IMPLANT;  Surgeon: Evans Lance, MD;  Location: [facility] CV LAB;  Service: Cardiovascular;  Laterality: N/A;    FAMILY HISTORY Family History  Problem Relation Age of Onset   Diabetes Brother     SOCIAL HISTORY Social History   Tobacco Use   Smoking status: Former    Packs/day: 1.00    Years: 40.00    Pack years: 40.00    Types: Cigarettes    Quit date: 12/11/2013    Years since quitting: 7.1   Smokeless tobacco: Never  Substance Use Topics   Alcohol use: Yes    Alcohol/week: 8.0 standard drinks    Types: 8 Glasses of wine per week   Drug use: No         OPHTHALMIC EXAM:  Base Eye Exam     Visual Acuity (Snellen - Linear)       Right Left   Dist cc 20/70 -1 20/20 -2   Dist ph cc NI     Correction: Glasses         Tonometry (Tonopen, 2:10 PM)       Right Left   Pressure 12 10         Pupils       Dark Light Shape React APD   Right 4 3 Round Brisk None   Left 4 3 Round Brisk None         Visual Fields (Counting fingers)       Left Right    Full Full         Extraocular Movement       Right Left    Full Full         Neuro/Psych     Oriented x3: Yes   Mood/Affect: Normal         Dilation     Both eyes: 1.0% Mydriacyl, 2.5%  Phenylephrine @ 2:10 PM           Slit Lamp and Fundus Exam  Slit Lamp Exam       Right Left   Lids/Lashes Dermatochalasis - upper lid Dermatochalasis - upper lid   Conjunctiva/Sclera White and quiet White and quiet   Cornea Trace PEE, mild arcus, well healed temporal cataract wound, EBMD 1+ PEE, mild arcus, well healed temporal cataract wound   Anterior Chamber deep and clear deep and clear   Iris round and dilated round and dilated   Lens PC IOL in perfect position, open PC PC IOL in perfect position, open PC   Vitreous syneresis syneresis, Posterior vitreous detachment         Fundus Exam       Right Left   Disc Optic disc edema, hyperemia, vascular loops, focal PPP temporal, Compact compact, mild tilt, pink and sharp   C/D Ratio 0.2 0.4   Macula good foveal reflex, interval improvement in central edema/IRH, rare MA flat, good foveal reflex, mild RPE mottling and clumping, trace drusen, no edema, rare IRH/DBH   Vessels Dilated and Tortuous, +CRVO attenuated, tortuous, mild A/V crossing changes   Periphery Attached, scattered IRH - improved, mild reticular degeneration attached, mild reticular degeneration           Refraction     Wearing Rx       Sphere Cylinder Add   Right -0.75 Sphere +2.50   Left -0.50 Sphere +2.50    Type: PAL            IMAGING AND PROCEDURES  Imaging and Procedures for @TODAY @  OCT, Retina - OU - Both Eyes       Right Eye Quality was good. Central Foveal Thickness: 477. Progression has improved. Findings include no SRF, normal foveal contour, no IRF (Interval resolution of IRF/SRF).   Left Eye Quality was good. Central Foveal Thickness: 261. Progression has been stable. Findings include normal foveal contour, no IRF, no SRF, retinal drusen (Trace drusen).   Notes *Images captured and stored on drive  Diagnosis / Impression:  OD: Interval resolution of IRF/SRF OS: NFP, no IRF/SRF; Trace drusen  Clinical management:   See below  Abbreviations: NFP - Normal foveal profile. CME - cystoid macular edema. PED - pigment epithelial detachment. IRF - intraretinal fluid. SRF - subretinal fluid. EZ - ellipsoid zone. ERM - epiretinal membrane. ORA - outer retinal atrophy. ORT - outer retinal tubulation. SRHM - subretinal hyper-reflective material      Intravitreal Injection, Pharmacologic Agent - OD - Right Eye       Time Out 02/13/2021. 2:56 PM. Confirmed correct patient, procedure, site, and patient consented.   Anesthesia Topical anesthesia was used. Anesthetic medications included Lidocaine 2%, Proparacaine 0.5%.   Procedure Preparation included 5% betadine to ocular surface, eyelid speculum. A supplied needle was used.   Injection: 1.25 mg Bevacizumab 1.25mg /0.58ml   Route: Intravitreal, Site: Right Eye   NDC: H061816, Lot: [id number] , Expiration date: 03/28/2021, Waste: 0 mL   Post-op Post injection exam found visual acuity of at least counting fingers. The patient tolerated the procedure well. There were no complications. The patient received written and verbal post procedure care education.               ASSESSMENT/PLAN:    ICD-10-CM   1. Central retinal vein occlusion with macular edema of right eye  H34.8110 Intravitreal Injection, Pharmacologic Agent - OD - Right Eye    Bevacizumab (AVASTIN) SOLN 1.25 mg    2. Retinal edema  H35.81 OCT, Retina - OU - Both Eyes    3.  Factor V Leiden ([place])  D68.51     4. Essential hypertension  I10     5. Hypertensive retinopathy of both eyes  H35.033     6. Pseudophakia of both eyes  Z96.1        1-3. CRVO with macular edema OD        History of Factor V Leiden on Eliquis  - pt reports history of blood clots / DVTs  - significant CV history with A fib s/p ablation and pacemaker implantation             - s/p IVA OD # 1 (03.09.21), #2 (04.06.21), #3 (5.4.21), #4 (06.01.21), #5 (07.07.21), #6 (08.11.21), #7 (09.15.2021), #8 (10.20.21),  #9 (11.17.21), #10 (12.20.21), #11 (1.24.22), #12 (2.22.22), #13 (3.22.22), #14 (4.19.22), #15 (5.31.22)  **history of recurrent CME at 5 wk interval (10.20.21) and on (5.31.22)**  - FA (04.06.21) shows delayed filling time, hyperfluorescence of the disc, mild focal leakage along IT arcades, mild focal leakage perifovea  - OCT today shows interval improvement in IRF/SRF (resolved) at 4 wks  - BCVA stable at 20/70 OD  - recommend IVA OD #16 today, 06.28.22 w/ f/u in 4 wks  - pt wishes to proceed  - RBA of procedure discussed, questions answered  - IVA informed consent obtained and signed (OD)  - see procedure note  - patient will be moving to [place] in mid-July. - she will be due for her 4 wk injection on 7.26.22 or soon after     - will refer pt to Dr. Ephraim Hamburger at [place] [facility] office if possible  4,5. Hypertensive retinopathy OU  - discussed importance of tight BP control  - monitor  6. Pseudophakia OU  - s/p CE/IOL OU w/ Dr. Valetta Close  - IOLs in good position   - monitor  Ophthalmic Meds Ordered this visit:  Meds ordered this encounter  Medications   Bevacizumab (AVASTIN) SOLN 1.25 mg       Return if symptoms worsen or fail to improve.  There are no Patient Instructions on file for this visit.  This document serves as a record of services personally performed by Gardiner Sleeper, MD, PhD. It was created on their behalf by Estill Bakes, COT an ophthalmic technician. The creation of this record is the provider's dictation and/or activities during the visit.    Electronically signed by: Estill Bakes, COT 6.28.22 @ 5:31 PM   This document serves as a record of services personally performed by Gardiner Sleeper, MD, PhD. It was created on their behalf by San Jetty. Owens Shark, OA an ophthalmic technician. The creation of this record is the provider's dictation and/or activities during the visit.    Electronically signed by: San Jetty. Marguerita Merles  06.28.2022 5:31 PM  Gardiner Sleeper, M.D., Ph.D. Diseases & Surgery of the Retina and Vitreous [facility]  I have reviewed the above documentation for accuracy and completeness, and I agree with the above. Gardiner Sleeper, M.D., Ph.D. 02/13/21 5:31 PM  Abbreviations: M myopia (nearsighted); A astigmatism; H hyperopia (farsighted); P presbyopia; Mrx spectacle prescription;  CTL contact lenses; OD right eye; OS left eye; OU both eyes  XT exotropia; ET esotropia; PEK punctate epithelial keratitis; PEE punctate epithelial erosions; DES dry eye syndrome; MGD meibomian gland dysfunction; ATs artificial tears; PFAT's preservative free artificial tears; [place] nuclear sclerotic cataract; PSC posterior subcapsular cataract; ERM epi-retinal membrane; PVD posterior vitreous detachment; RD retinal  detachment; DM diabetes mellitus; DR diabetic retinopathy; NPDR non-proliferative diabetic retinopathy; PDR proliferative diabetic retinopathy; CSME clinically significant macular edema; DME diabetic macular edema; dbh dot blot hemorrhages; CWS cotton wool spot; POAG primary open angle glaucoma; C/D cup-to-disc ratio; HVF humphrey visual field; GVF goldmann visual field; OCT optical coherence tomography; IOP intraocular pressure; BRVO Branch retinal vein occlusion; CRVO central retinal vein occlusion; CRAO central retinal artery occlusion; BRAO branch retinal artery occlusion; RT retinal tear; SB scleral buckle; PPV pars plana vitrectomy; VH Vitreous hemorrhage; PRP panretinal laser photocoagulation; IVK intravitreal kenalog; VMT vitreomacular traction; MH Macular hole;  NVD neovascularization of the disc; NVE neovascularization elsewhere; AREDS age related eye disease study; ARMD age related macular degeneration; POAG primary open angle glaucoma; EBMD epithelial/anterior basement membrane dystrophy; ACIOL anterior chamber intraocular lens; IOL intraocular lens; PCIOL posterior chamber intraocular lens;  Phaco/IOL phacoemulsification with intraocular lens placement; [place] photorefractive keratectomy; LASIK laser assisted in situ keratomileusis; HTN hypertension; DM diabetes mellitus; COPD chronic obstructive pulmonary disease.

## 2021-02-15 DIAGNOSIS — E7800 Pure hypercholesterolemia, unspecified: Secondary | ICD-10-CM | POA: Diagnosis not present

## 2021-02-15 DIAGNOSIS — E039 Hypothyroidism, unspecified: Secondary | ICD-10-CM | POA: Diagnosis not present

## 2021-02-26 DIAGNOSIS — D6851 Activated protein C resistance: Secondary | ICD-10-CM | POA: Diagnosis not present

## 2021-02-26 DIAGNOSIS — E7800 Pure hypercholesterolemia, unspecified: Secondary | ICD-10-CM | POA: Diagnosis not present

## 2021-02-26 DIAGNOSIS — Z950 Presence of cardiac pacemaker: Secondary | ICD-10-CM | POA: Diagnosis not present

## 2021-02-26 DIAGNOSIS — I495 Sick sinus syndrome: Secondary | ICD-10-CM | POA: Diagnosis not present

## 2021-02-26 DIAGNOSIS — E039 Hypothyroidism, unspecified: Secondary | ICD-10-CM | POA: Diagnosis not present

## 2021-02-26 DIAGNOSIS — R69 Illness, unspecified: Secondary | ICD-10-CM | POA: Diagnosis not present

## 2021-02-26 DIAGNOSIS — I482 Chronic atrial fibrillation, unspecified: Secondary | ICD-10-CM | POA: Diagnosis not present

## 2021-02-26 DIAGNOSIS — I10 Essential (primary) hypertension: Secondary | ICD-10-CM | POA: Diagnosis not present

## 2021-02-26 DIAGNOSIS — G4733 Obstructive sleep apnea (adult) (pediatric): Secondary | ICD-10-CM | POA: Diagnosis not present

## 2021-02-26 DIAGNOSIS — J449 Chronic obstructive pulmonary disease, unspecified: Secondary | ICD-10-CM | POA: Diagnosis not present

## 2021-02-28 ENCOUNTER — Encounter (INDEPENDENT_AMBULATORY_CARE_PROVIDER_SITE_OTHER): Payer: Medicare HMO | Admitting: Ophthalmology

## 2021-02-28 ENCOUNTER — Encounter (INDEPENDENT_AMBULATORY_CARE_PROVIDER_SITE_OTHER): Payer: Self-pay

## 2021-03-26 ENCOUNTER — Ambulatory Visit (INDEPENDENT_AMBULATORY_CARE_PROVIDER_SITE_OTHER): Payer: Medicare HMO

## 2021-03-26 DIAGNOSIS — I495 Sick sinus syndrome: Secondary | ICD-10-CM

## 2021-03-28 LAB — CUP PACEART REMOTE DEVICE CHECK
Battery Remaining Longevity: 92 mo
Battery Remaining Percentage: 72 %
Battery Voltage: 3.01 V
Brady Statistic RV Percent Paced: 33 %
Date Time Interrogation Session: 20220808020013
Implantable Lead Implant Date: 20190711
Implantable Lead Implant Date: 20190711
Implantable Lead Location: 753859
Implantable Lead Location: 753860
Implantable Pulse Generator Implant Date: 20190711
Lead Channel Impedance Value: 550 Ohm
Lead Channel Pacing Threshold Amplitude: 1 V
Lead Channel Pacing Threshold Pulse Width: 0.4 ms
Lead Channel Sensing Intrinsic Amplitude: 12 mV
Lead Channel Setting Pacing Amplitude: 2.5 V
Lead Channel Setting Pacing Pulse Width: 0.4 ms
Lead Channel Setting Sensing Sensitivity: 2 mV
Pulse Gen Model: 2272
Pulse Gen Serial Number: 9041042

## 2021-03-29 DIAGNOSIS — Z85828 Personal history of other malignant neoplasm of skin: Secondary | ICD-10-CM | POA: Insufficient documentation

## 2021-03-29 DIAGNOSIS — Z87891 Personal history of nicotine dependence: Secondary | ICD-10-CM | POA: Insufficient documentation

## 2021-03-29 DIAGNOSIS — F3341 Major depressive disorder, recurrent, in partial remission: Secondary | ICD-10-CM | POA: Insufficient documentation

## 2021-04-19 NOTE — Progress Notes (Signed)
Remote pacemaker transmission.   

## 2021-05-08 ENCOUNTER — Telehealth: Payer: Self-pay | Admitting: Cardiology

## 2021-05-08 NOTE — Telephone Encounter (Signed)
Carly Romero called requesting records for this patient be sent to their office.  The patient has moved and has her first appointment with Dr. Eduardo Osier 05/09/21 at 11:30 am.  Please fax records to [phone]

## 2021-05-08 NOTE — Telephone Encounter (Signed)
Routed to medical records

## 2021-06-25 ENCOUNTER — Ambulatory Visit: Payer: Medicare HMO

## 2021-06-25 DIAGNOSIS — I495 Sick sinus syndrome: Secondary | ICD-10-CM

## 2021-06-26 LAB — CUP PACEART REMOTE DEVICE CHECK
Battery Remaining Longevity: 90 mo
Battery Remaining Percentage: 70 %
Battery Voltage: 3.01 V
Brady Statistic RV Percent Paced: 31 %
Date Time Interrogation Session: 20221107010015
Implantable Lead Implant Date: 20190711
Implantable Lead Implant Date: 20190711
Implantable Lead Location: 753859
Implantable Lead Location: 753860
Implantable Pulse Generator Implant Date: 20190711
Lead Channel Impedance Value: 560 Ohm
Lead Channel Pacing Threshold Amplitude: 0.75 V
Lead Channel Pacing Threshold Pulse Width: 0.4 ms
Lead Channel Sensing Intrinsic Amplitude: 12 mV
Lead Channel Setting Pacing Amplitude: 2.5 V
Lead Channel Setting Pacing Pulse Width: 0.4 ms
Lead Channel Setting Sensing Sensitivity: 2 mV
Pulse Gen Model: 2272
Pulse Gen Serial Number: 9041042

## 2021-06-29 NOTE — Progress Notes (Signed)
Remote pacemaker transmission.   

## 2021-07-19 ENCOUNTER — Telehealth: Payer: Self-pay

## 2021-07-19 NOTE — Telephone Encounter (Signed)
Kaitlyn from [facility] called stating the patient is now being followed by them. I have released the patient in [facility]. I cancelled her upcoming [facility]otes appointments. I marked her inactive in [facility].

## 2021-08-08 ENCOUNTER — Other Ambulatory Visit: Payer: Self-pay

## 2021-08-08 MED ORDER — ROSUVASTATIN CALCIUM 40 MG PO TABS: 40.0000 mg | ORAL_TABLET | Freq: Every day | ORAL | 1 refills | Status: DC

## 2021-08-09 ENCOUNTER — Other Ambulatory Visit: Payer: Self-pay

## 2021-08-09 MED ORDER — ROSUVASTATIN CALCIUM 40 MG PO TABS: 40.0000 mg | ORAL_TABLET | Freq: Every day | ORAL | 1 refills | Status: DC

## 2021-08-27 ENCOUNTER — Other Ambulatory Visit: Payer: Self-pay | Admitting: Physician Assistant

## 2021-09-26 ENCOUNTER — Other Ambulatory Visit: Payer: Self-pay | Admitting: Physician Assistant

## 2021-10-09 DIAGNOSIS — N3281 Overactive bladder: Secondary | ICD-10-CM | POA: Insufficient documentation

## 2021-11-29 ENCOUNTER — Other Ambulatory Visit: Payer: Self-pay | Admitting: Internal Medicine

## 2021-11-30 DIAGNOSIS — R911 Solitary pulmonary nodule: Secondary | ICD-10-CM | POA: Insufficient documentation

## 2021-11-30 DIAGNOSIS — M8589 Other specified disorders of bone density and structure, multiple sites: Secondary | ICD-10-CM | POA: Insufficient documentation

## 2021-11-30 DIAGNOSIS — I700 Atherosclerosis of aorta: Secondary | ICD-10-CM | POA: Insufficient documentation

## 2022-02-24 ENCOUNTER — Other Ambulatory Visit: Payer: Self-pay | Admitting: Internal Medicine

## 2022-03-13 ENCOUNTER — Other Ambulatory Visit: Payer: Self-pay | Admitting: Internal Medicine

## 2022-03-21 ENCOUNTER — Other Ambulatory Visit: Payer: Self-pay | Admitting: Internal Medicine

## 2022-03-23 ENCOUNTER — Other Ambulatory Visit: Payer: Self-pay | Admitting: Physician Assistant

## 2022-03-25 ENCOUNTER — Other Ambulatory Visit: Payer: Self-pay | Admitting: Internal Medicine

## 2022-04-08 ENCOUNTER — Other Ambulatory Visit: Payer: Self-pay | Admitting: Internal Medicine

## 2022-04-08 ENCOUNTER — Other Ambulatory Visit: Payer: Self-pay

## 2022-04-08 NOTE — Telephone Encounter (Signed)
Per chart notes patient is seeing new cardiologist in [place] for the past year. Refill denied with note to pharmacy to send to new cardiologist.

## 2022-05-01 ENCOUNTER — Other Ambulatory Visit: Payer: Self-pay | Admitting: Internal Medicine

## 2022-05-13 ENCOUNTER — Other Ambulatory Visit: Payer: Self-pay | Admitting: Internal Medicine

## 2022-08-15 ENCOUNTER — Other Ambulatory Visit: Payer: Self-pay | Admitting: Internal Medicine

## 2022-11-16 ENCOUNTER — Other Ambulatory Visit: Payer: Self-pay | Admitting: Internal Medicine

## 2022-12-02 ENCOUNTER — Other Ambulatory Visit: Payer: Self-pay | Admitting: Internal Medicine
